# Patient Record
Sex: Female | Born: 1935 | Race: White | Hispanic: No | Marital: Single | Smoking: Never smoker
Health system: Southern US, Community
[De-identification: ages and names within clinical notes are randomized; demographics above are authoritative.]

## PROBLEM LIST (undated history)

## (undated) DIAGNOSIS — Z86711 Personal history of pulmonary embolism: Secondary | ICD-10-CM

## (undated) DIAGNOSIS — E785 Hyperlipidemia, unspecified: Secondary | ICD-10-CM

## (undated) DIAGNOSIS — G5139 Clonic hemifacial spasm, unspecified: Secondary | ICD-10-CM

## (undated) DIAGNOSIS — K219 Gastro-esophageal reflux disease without esophagitis: Secondary | ICD-10-CM

## (undated) DIAGNOSIS — G2581 Restless legs syndrome: Secondary | ICD-10-CM

## (undated) DIAGNOSIS — I10 Essential (primary) hypertension: Secondary | ICD-10-CM

## (undated) DIAGNOSIS — R011 Cardiac murmur, unspecified: Secondary | ICD-10-CM

## (undated) DIAGNOSIS — C50919 Malignant neoplasm of unspecified site of unspecified female breast: Secondary | ICD-10-CM

## (undated) HISTORY — DX: Clonic hemifacial spasm, unspecified: G51.39

## (undated) HISTORY — DX: Restless legs syndrome: G25.81

## (undated) HISTORY — DX: Essential (primary) hypertension: I10

## (undated) HISTORY — DX: Hyperlipidemia, unspecified: E78.5

## (undated) HISTORY — PX: BREAST EXCISIONAL BIOPSY: SUR124

## (undated) HISTORY — PX: ABDOMINAL HYSTERECTOMY: SHX81

---

## 1997-06-02 HISTORY — PX: TOE SURGERY: SHX1073

## 2000-06-02 HISTORY — PX: COLONOSCOPY: SHX174

## 2000-10-13 ENCOUNTER — Ambulatory Visit (HOSPITAL_COMMUNITY): Admission: RE | Admit: 2000-10-13 | Discharge: 2000-10-13 | Payer: Self-pay

## 2001-10-25 ENCOUNTER — Ambulatory Visit (HOSPITAL_COMMUNITY): Admission: RE | Admit: 2001-10-25 | Discharge: 2001-10-25 | Payer: Self-pay | Admitting: *Deleted

## 2001-10-25 ENCOUNTER — Encounter: Payer: Self-pay | Admitting: *Deleted

## 2002-11-09 ENCOUNTER — Encounter: Payer: Self-pay | Admitting: *Deleted

## 2002-11-09 ENCOUNTER — Ambulatory Visit (HOSPITAL_COMMUNITY): Admission: RE | Admit: 2002-11-09 | Discharge: 2002-11-09 | Payer: Self-pay | Admitting: *Deleted

## 2003-12-28 ENCOUNTER — Ambulatory Visit (HOSPITAL_COMMUNITY): Admission: RE | Admit: 2003-12-28 | Discharge: 2003-12-28 | Payer: Self-pay

## 2005-07-07 ENCOUNTER — Ambulatory Visit: Payer: Self-pay | Admitting: Gastroenterology

## 2006-06-16 ENCOUNTER — Ambulatory Visit: Payer: Self-pay | Admitting: Obstetrics and Gynecology

## 2007-04-06 ENCOUNTER — Ambulatory Visit: Payer: Self-pay | Admitting: Internal Medicine

## 2007-05-25 ENCOUNTER — Ambulatory Visit: Payer: Self-pay | Admitting: Gastroenterology

## 2008-04-07 ENCOUNTER — Ambulatory Visit: Payer: Self-pay | Admitting: Internal Medicine

## 2009-04-13 ENCOUNTER — Ambulatory Visit: Payer: Self-pay | Admitting: Internal Medicine

## 2010-05-16 ENCOUNTER — Ambulatory Visit: Payer: Self-pay | Admitting: Internal Medicine

## 2011-05-30 ENCOUNTER — Ambulatory Visit: Payer: Self-pay | Admitting: Internal Medicine

## 2012-02-13 ENCOUNTER — Ambulatory Visit: Payer: Self-pay | Admitting: Neurology

## 2012-06-04 ENCOUNTER — Ambulatory Visit: Payer: Self-pay | Admitting: Internal Medicine

## 2013-01-28 ENCOUNTER — Ambulatory Visit: Payer: Self-pay | Admitting: Internal Medicine

## 2013-03-02 ENCOUNTER — Encounter: Payer: Self-pay | Admitting: General Surgery

## 2013-03-21 ENCOUNTER — Ambulatory Visit (INDEPENDENT_AMBULATORY_CARE_PROVIDER_SITE_OTHER): Payer: Medicare Other | Admitting: General Surgery

## 2013-03-21 ENCOUNTER — Encounter: Payer: Self-pay | Admitting: General Surgery

## 2013-03-21 VITALS — BP 130/76 | HR 74 | Resp 12 | Ht 62.0 in | Wt 155.0 lb

## 2013-03-21 DIAGNOSIS — R109 Unspecified abdominal pain: Secondary | ICD-10-CM

## 2013-03-21 NOTE — Progress Notes (Signed)
Patient ID: Patricia Knox, female   DOB: [birth year], 77 y.o.   MRN: [id number]  Chief Complaint  Patient presents with  . Other    gallbladder    HPI Patricia Knox is a 77 y.o. female who presents for an evaluation of her gallbladder. Referred here by Dr Yves Dill. Had gallbladder U/S done in office which showed gallbladder sludge. The patient states that she went in for a check up with her primary care physician and mentioned left back pressure. She states when she stand for a long period of time that's when she starts to feel the pressure. Sitting down helps to relieve the pain. This is what prompted an x-ray and ultrasound. No other complaints.  The patient reports no dietary intolerance. No change in bowel or bladder function.    HPI  Past Medical History  Diagnosis Date  . Hyperlipidemia     Past Surgical History  Procedure Laterality Date  . Toe surgery  1999    No family history on file.  Social History History  Substance Use Topics  . Smoking status: Never Smoker   . Smokeless tobacco: Never Used  . Alcohol Use: No    No Known Allergies  Current Outpatient Prescriptions  Medication Sig Dispense Refill  . alendronate (FOSAMAX) 70 MG tablet Take 70 mg by mouth every 7 (seven) days.       . Calcium Carbonate-Vitamin D (CALTRATE 600+D PO) Take 1 capsule by mouth 2 (two) times daily.      . fish oil-omega-3 fatty acids 1000 MG capsule Take 1 g by mouth 2 (two) times daily.      [name] lisinopril-hydrochlorothiazide (PRINZIDE,ZESTORETIC) 20-12.5 MG per tablet Take 0.5 tablets by mouth daily.      [name] omeprazole (PRILOSEC) 20 MG capsule       . rOPINIRole (REQUIP) 0.5 MG tablet       . simvastatin (ZOCOR) 80 MG tablet       . traZODone (DESYREL) 100 MG tablet       . warfarin (COUMADIN) 5 MG tablet        No current facility-administered medications for this visit.    Review of Systems Review of Systems  Constitutional: Negative.   Respiratory: Negative.    Cardiovascular: Negative.   Gastrointestinal: Negative.     Blood pressure 130/76, pulse 74, resp. rate 12, height 5\' 2"  (1.575 m), weight 155 lb (70.308 kg).  Physical Exam Physical Exam  Constitutional: She is oriented to person, place, and time. She appears well-developed and well-nourished.  Neck: No thyromegaly present.  Cardiovascular: Normal rate, regular rhythm and normal pulses.   Murmur heard.  Systolic murmur is present with a grade of 2/6  Mild edema in lower extremities.  Pulmonary/Chest: Effort normal and breath sounds normal.  Abdominal: Soft. Normal appearance and bowel sounds are normal. There is no hepatosplenomegaly. There is no tenderness. No hernia.  Lymphadenopathy:    She has no cervical adenopathy.  Neurological: She is alert and oriented to person, place, and time.  Skin: Skin is warm and dry.    Data Reviewed Abdominal ultrasound dated February 16, 2013 completed to her primary care physician's office showed intraluminal sludge/and/or intraluminal gallbladder crystals within the bile. Common bile duct diameter 0.24 cm. Gallbladder wall thickness 0.26 cm. No evidence of biliary tract obstruction.  Assessment    Left flank pain likely secondary to musculoskeletal source. No evidence of symptomatic discomfort related to biliary tract.     Plan  No intervention is recommended at this time. The patient was encouraged to call should she develop fatty food intolerance or right upper quadrant pain. Follow up otherwise will be on an as needed basis.        Earline Mayotte 03/22/2013, 9:16 PM

## 2013-03-21 NOTE — Patient Instructions (Addendum)
Patient to return as needed. If the patient begins to have new symptoms, questions, or concerns she is to contact our office.

## 2013-06-02 HISTORY — PX: BREAST SURGERY: SHX581

## 2013-06-17 ENCOUNTER — Ambulatory Visit: Payer: Self-pay | Admitting: Internal Medicine

## 2013-06-23 ENCOUNTER — Ambulatory Visit: Payer: Self-pay | Admitting: Internal Medicine

## 2013-07-08 ENCOUNTER — Ambulatory Visit: Payer: Self-pay | Admitting: Internal Medicine

## 2013-07-08 HISTORY — PX: BREAST BIOPSY: SHX20

## 2013-07-11 LAB — PATHOLOGY REPORT

## 2013-07-27 ENCOUNTER — Telehealth: Payer: Self-pay

## 2013-07-27 NOTE — Telephone Encounter (Signed)
Message left for patient to call back to reschedule appointment for tomorrow due to the weather.

## 2013-07-28 ENCOUNTER — Encounter: Payer: Self-pay | Admitting: General Surgery

## 2013-07-28 ENCOUNTER — Ambulatory Visit (INDEPENDENT_AMBULATORY_CARE_PROVIDER_SITE_OTHER): Payer: Medicare Other | Admitting: General Surgery

## 2013-07-28 ENCOUNTER — Ambulatory Visit: Payer: Medicare Other | Admitting: General Surgery

## 2013-07-28 VITALS — Ht 62.0 in | Wt 154.0 lb

## 2013-07-28 DIAGNOSIS — N63 Unspecified lump in unspecified breast: Secondary | ICD-10-CM

## 2013-07-28 DIAGNOSIS — R928 Other abnormal and inconclusive findings on diagnostic imaging of breast: Secondary | ICD-10-CM

## 2013-07-28 DIAGNOSIS — D249 Benign neoplasm of unspecified breast: Secondary | ICD-10-CM | POA: Insufficient documentation

## 2013-07-28 NOTE — Progress Notes (Addendum)
Patient ID: Patricia Knox, female   DOB: April 09, 1936, 78 y.o.   MRN: [id number]  Chief Complaint  Patient presents with  . Breast Problem    Biopsy done at [facility].  Surgical consult requested.    HPI BRIDGITTE Knox is a 78 y.o. female.  Is seen regarding a breast abnormality. The patient had screening mammograms in January 2015 and was called back for additional views and ultrasound of an area in the left breast. 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 She is seen now to discuss options for management.  The patient has not had any breast problems in the past. No history of trauma to the breast. HPI  Past Medical History  Diagnosis Date  . Hyperlipidemia     Past Surgical History  Procedure Laterality Date  . Toe surgery  1999    History reviewed. No pertinent family history.  Social History History  Substance Use Topics  . Smoking status: Never Smoker   . Smokeless tobacco: Never Used  . Alcohol Use: No    No Known Allergies  Current Outpatient Prescriptions  Medication Sig Dispense Refill  . alendronate (FOSAMAX) 70 MG tablet Take 70 mg by mouth every 7 (seven) days.       . Calcium Carbonate-Vitamin D (CALTRATE 600+D PO) Take 1 capsule by mouth 2 (two) times daily.      . fish oil-omega-3 fatty acids 1000 MG capsule Take 1 g by mouth 2 (two) times daily.      [name] lisinopril-hydrochlorothiazide (PRINZIDE,ZESTORETIC) 20-12.5 MG per tablet Take 0.5 tablets by mouth daily.      [name] omeprazole (PRILOSEC) 20 MG capsule       . rOPINIRole (REQUIP) 0.5 MG tablet       . simvastatin (ZOCOR) 80 MG tablet       . traZODone (DESYREL) 100 MG tablet       . warfarin (COUMADIN) 5 MG tablet        No current facility-administered medications for this visit.    Review of Systems Review of Systems  Height 5\' 2"  (1.575 m), weight 154 lb (69.854 kg).  Physical Exam Physical  Exam  Constitutional: She appears well-developed and well-nourished.  HENT:  Head: Normocephalic.  Eyes: Pupils are equal, round, and reactive to light.  Neck: Neck supple.  Cardiovascular: Normal rate and regular rhythm.   Pulmonary/Chest: Effort normal and breath sounds normal. Right breast exhibits no inverted nipple, no mass and no nipple discharge. Left breast exhibits no inverted nipple, no mass and no nipple discharge. Breasts are symmetrical.  Lymphadenopathy:    She has no axillary adenopathy.    Data Reviewed Mammograms from January 2012 through February 2050 were reviewed. The area of density best appreciated on the CC view of the left breast in January 2012 measured 0.82 cm. On the January 2015 exam this measured 0.85 cm.  Pathology as noted above 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  Assessment    Papillary neoplasm of the left breast. Low likelihood of malignancy.    Plan    The original biopsy was completed with a 14-gauge device. I have a low index of suspicion for malignancy, and have recommended complete removal of the lesion by vacuum biopsy. This would be less morbid and less disruptive than formal excision.  The procedure was reviewed, and the patient will contact the office on February 23 to schedule. He  follow up exam was not schedule at the time of today's visit to the inclement weather and skeleton staff.     The patient is on chronic Coumadin therapy secondary to a pulmonary embolus with moderate pulmonary hypertension originally diagnosed in November 2008. We'll plan to complete the biopsy on Coumadin to minimize the risk of recurrent pulmonary emboli.  [name] 07/28/2013, 11:59 AM

## 2013-08-01 ENCOUNTER — Telehealth: Payer: Self-pay | Admitting: *Deleted

## 2013-08-01 NOTE — Telephone Encounter (Signed)
Per Dr. Bary Castilla: The patient will be calling the office on Monday, March 2 2 scheduled appointment for a vacuum biopsy of the left breast. Please arrange at the earliest convenient time. Thank you  Patient has been scheduled for 08-18-13 per her request for a Thursday appointment. This patient states she is not taking Mobic but is taking Requip for restless legs syndrome and cannot go without taking this medication.

## 2013-08-01 NOTE — Telephone Encounter (Signed)
Message copied by Dominga Ferry on Mon Aug 01, 2013  1:36 PM ------      Message from: [place], Forest Gleason      Created: Thu Jul 28, 2013 12:07 PM       The patient should continue her Coumadin, but if she is making use of Mobic she would be asked to stop this prior to the procedure. ------

## 2013-08-18 ENCOUNTER — Encounter: Payer: Self-pay | Admitting: General Surgery

## 2013-08-18 ENCOUNTER — Ambulatory Visit (INDEPENDENT_AMBULATORY_CARE_PROVIDER_SITE_OTHER): Payer: Medicare Other | Admitting: General Surgery

## 2013-08-18 ENCOUNTER — Other Ambulatory Visit: Payer: Medicare Other

## 2013-08-18 VITALS — BP 118/80 | HR 76 | Resp 12 | Ht 62.0 in | Wt 155.0 lb

## 2013-08-18 DIAGNOSIS — N63 Unspecified lump in unspecified breast: Secondary | ICD-10-CM

## 2013-08-18 DIAGNOSIS — D249 Benign neoplasm of unspecified breast: Secondary | ICD-10-CM

## 2013-08-18 NOTE — Patient Instructions (Signed)

## 2013-08-18 NOTE — Progress Notes (Signed)
Patient ID: Patricia Knox, female   DOB: [birth year], 78 y.o.   MRN: [id number]  Chief Complaint  Patient presents with  . Procedure    left breast biopsy    HPI ABRIELLE Knox is a 78 y.o. female.  here today for left breast ENCORE breast biopsy. The patient had previously had a core biopsy with a 14-gauge device what turned out to be papillary neoplasm with sclerosis. No evidence of atypia or malignancy noted. She is making use of Coumadin for previous pulmonary embolism with pulmonary compromise. She was felt to be a candidate for vacuum excision of the area as opposed to formal operative excision to minimize the risk for bleeding on Coumadin or recurrent pulmonary embolism off Coumadin. She reports at the time of her January 2015 biopsy her Coumadin was held. HPI  Past Medical History  Diagnosis Date  . Hyperlipidemia     Past Surgical History  Procedure Laterality Date  . Toe surgery  1999  . Breast surgery Left January 2015    Pathology as noted above 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    No family history on file.  Social History History  Substance Use Topics  . Smoking status: Never Smoker   . Smokeless tobacco: Never Used  . Alcohol Use: No    No Known Allergies  Current Outpatient Prescriptions  Medication Sig Dispense Refill  . alendronate (FOSAMAX) 70 MG tablet Take 70 mg by mouth every 7 (seven) days.       . Calcium Carbonate-Vitamin D (CALTRATE 600+D PO) Take 1 capsule by mouth 2 (two) times daily.      . fish oil-omega-3 fatty acids 1000 MG capsule Take 1 g by mouth 2 (two) times daily.      Patricia Knox lisinopril-hydrochlorothiazide (PRINZIDE,ZESTORETIC) 20-12.5 MG per tablet Take 0.5 tablets by mouth daily.      Patricia Knox omeprazole (PRILOSEC) 20 MG capsule       . rOPINIRole (REQUIP) 0.5 MG tablet       . simvastatin (ZOCOR) 80 MG tablet       . traZODone (DESYREL) 100 MG tablet       . warfarin (COUMADIN) 5 MG tablet        No current  facility-administered medications for this visit.    Review of Systems Review of Systems  Constitutional: Negative.   Respiratory: Negative.   Cardiovascular: Negative.     Blood pressure 118/80, pulse 76, resp. rate 12, height 5\' 2"  (1.575 m), weight 155 lb (70.308 kg).  Physical Exam Physical Exam Clinical examination of the breast is unremarkable.  Data Reviewed Pathology as noted above 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   Ultrasound examination of the left breast at the 9:30 o'clock position shows 0.4-0.46 x 0.5 cm hypoechoic area corresponding to her previous study. Skin prepped with alcohol was undertaken followed by 10 cc of 0.5% Xylocaine 0.25% Marcaine with 1-200,000 epinephrine. This was allowed to 12 for 10 minutes to achieve maximum vasoconstriction. Under ultrasound guidance a 9-gauge Encor device was passed under direct vision through lesion. Multiple samples were obtained with complete removal. The bowl length had been set to 11 mm to minimize tissue disturbance. Scant bleeding was noted. Direct pressure was held for 10 minutes. Skin defect was closed with benzoin and Steri-Strips followed by Telfa and Tegaderm dressing. The procedure was well-tolerated.  Assessment    Intraductal papillary neoplasm with sclerosis.        Plan    The patient  will be contacted once pathology is available.  Written instructions are for postbiopsy care were provided.       Patricia Knox 08/20/2013, 8:16 AM

## 2013-08-20 ENCOUNTER — Encounter: Payer: Self-pay | Admitting: General Surgery

## 2013-08-22 ENCOUNTER — Telehealth: Payer: Self-pay | Admitting: General Surgery

## 2013-08-22 LAB — PATHOLOGY

## 2013-08-22 NOTE — Telephone Encounter (Signed)
Telephone report from Delorse Lek, M.D. in the department of pathology showed the tissue resected at the time of the 9-gauge vacuum biopsy on 08/18/2013 showed only intraductal papilloma. No atypia. The lesion was completely removed based on ultrasound exam during the procedure.  The patient was notified that the results were benign.  She reports minimal bruising in no discomfort. She is going to come in on 08/24/2013 for nursing exam.  She should resume annual screening mammograms in January 2016 with her primary care provider.

## 2013-08-24 ENCOUNTER — Ambulatory Visit (INDEPENDENT_AMBULATORY_CARE_PROVIDER_SITE_OTHER): Payer: Medicare Other | Admitting: *Deleted

## 2013-08-24 DIAGNOSIS — N63 Unspecified lump in unspecified breast: Secondary | ICD-10-CM

## 2013-08-24 NOTE — Patient Instructions (Addendum)
Continue self breast exams. Call office for any new breast issues or concerns. She should resume annual screening mammograms in January 2016 with her primary care provider

## 2013-08-24 NOTE — Progress Notes (Signed)
Here for left breast biopsy check. Patient here today for follow up post left breast biopsy.   Minimal bruising noted.  The patient is aware that a heating pad may be used for comfort as needed.  Aware of pathology. Follow up as scheduled.

## 2014-04-03 ENCOUNTER — Encounter: Payer: Self-pay | Admitting: General Surgery

## 2014-05-23 ENCOUNTER — Ambulatory Visit: Payer: Self-pay | Admitting: Internal Medicine

## 2014-10-27 ENCOUNTER — Other Ambulatory Visit: Payer: Self-pay | Admitting: Internal Medicine

## 2014-10-27 DIAGNOSIS — Z1231 Encounter for screening mammogram for malignant neoplasm of breast: Secondary | ICD-10-CM

## 2014-11-10 ENCOUNTER — Ambulatory Visit: Payer: Self-pay

## 2014-11-17 ENCOUNTER — Other Ambulatory Visit: Payer: Self-pay | Admitting: Internal Medicine

## 2014-11-17 ENCOUNTER — Ambulatory Visit
Admission: RE | Admit: 2014-11-17 | Discharge: 2014-11-17 | Disposition: A | Discharge status: Home / Self Care | Payer: Medicare Other | Source: Ambulatory Visit | Attending: Internal Medicine | Admitting: Internal Medicine

## 2014-11-17 DIAGNOSIS — Z1231 Encounter for screening mammogram for malignant neoplasm of breast: Secondary | ICD-10-CM | POA: Diagnosis not present

## 2014-11-30 DIAGNOSIS — R29898 Other symptoms and signs involving the musculoskeletal system: Secondary | ICD-10-CM | POA: Insufficient documentation

## 2014-12-21 ENCOUNTER — Other Ambulatory Visit (HOSPITAL_COMMUNITY)
Admission: RE | Admit: 2014-12-21 | Discharge: 2014-12-21 | Disposition: A | Discharge status: Home / Self Care | Payer: Medicare Other | Source: Ambulatory Visit | Attending: Nephrology | Admitting: Nephrology

## 2015-06-03 DIAGNOSIS — C50919 Malignant neoplasm of unspecified site of unspecified female breast: Secondary | ICD-10-CM

## 2015-06-03 HISTORY — DX: Malignant neoplasm of unspecified site of unspecified female breast: C50.919

## 2015-10-05 ENCOUNTER — Other Ambulatory Visit: Payer: Self-pay | Admitting: Internal Medicine

## 2015-10-05 DIAGNOSIS — Z1231 Encounter for screening mammogram for malignant neoplasm of breast: Secondary | ICD-10-CM

## 2015-11-23 ENCOUNTER — Ambulatory Visit: Payer: Medicare Other | Attending: Internal Medicine

## 2016-01-04 ENCOUNTER — Other Ambulatory Visit: Payer: Self-pay | Admitting: Internal Medicine

## 2016-01-04 ENCOUNTER — Ambulatory Visit
Admission: RE | Admit: 2016-01-04 | Discharge: 2016-01-04 | Disposition: A | Discharge status: Home / Self Care | Payer: Medicare Other | Source: Ambulatory Visit | Attending: Internal Medicine | Admitting: Internal Medicine

## 2016-01-04 DIAGNOSIS — R928 Other abnormal and inconclusive findings on diagnostic imaging of breast: Secondary | ICD-10-CM | POA: Insufficient documentation

## 2016-01-04 DIAGNOSIS — Z1231 Encounter for screening mammogram for malignant neoplasm of breast: Secondary | ICD-10-CM | POA: Diagnosis present

## 2016-01-08 ENCOUNTER — Other Ambulatory Visit: Payer: Self-pay | Admitting: Internal Medicine

## 2016-01-08 DIAGNOSIS — N6320 Unspecified lump in the left breast, unspecified quadrant: Secondary | ICD-10-CM

## 2016-01-18 ENCOUNTER — Ambulatory Visit
Admission: RE | Admit: 2016-01-18 | Discharge: 2016-01-18 | Disposition: A | Discharge status: Home / Self Care | Payer: Medicare Other | Source: Ambulatory Visit | Attending: Internal Medicine | Admitting: Internal Medicine

## 2016-01-18 DIAGNOSIS — N6320 Unspecified lump in the left breast, unspecified quadrant: Secondary | ICD-10-CM

## 2016-01-18 DIAGNOSIS — N63 Unspecified lump in breast: Secondary | ICD-10-CM | POA: Diagnosis present

## 2016-01-18 DIAGNOSIS — N649 Disorder of breast, unspecified: Secondary | ICD-10-CM | POA: Insufficient documentation

## 2016-01-21 ENCOUNTER — Other Ambulatory Visit: Payer: Self-pay | Admitting: Internal Medicine

## 2016-01-21 DIAGNOSIS — R928 Other abnormal and inconclusive findings on diagnostic imaging of breast: Secondary | ICD-10-CM

## 2016-01-30 ENCOUNTER — Other Ambulatory Visit: Payer: Self-pay | Admitting: Internal Medicine

## 2016-01-30 ENCOUNTER — Ambulatory Visit
Admission: RE | Admit: 2016-01-30 | Discharge: 2016-01-30 | Disposition: A | Discharge status: Home / Self Care | Payer: Medicare Other | Source: Ambulatory Visit | Attending: Internal Medicine | Admitting: Internal Medicine

## 2016-01-30 DIAGNOSIS — N6022 Fibroadenosis of left breast: Secondary | ICD-10-CM | POA: Insufficient documentation

## 2016-01-30 DIAGNOSIS — R928 Other abnormal and inconclusive findings on diagnostic imaging of breast: Secondary | ICD-10-CM

## 2016-01-30 DIAGNOSIS — N62 Hypertrophy of breast: Secondary | ICD-10-CM | POA: Insufficient documentation

## 2016-01-30 DIAGNOSIS — Z9889 Other specified postprocedural states: Secondary | ICD-10-CM | POA: Diagnosis not present

## 2016-01-30 DIAGNOSIS — D242 Benign neoplasm of left breast: Secondary | ICD-10-CM | POA: Diagnosis not present

## 2016-01-30 DIAGNOSIS — N641 Fat necrosis of breast: Secondary | ICD-10-CM | POA: Insufficient documentation

## 2016-01-30 HISTORY — PX: BREAST BIOPSY: SHX20

## 2016-01-31 LAB — SURGICAL PATHOLOGY

## 2016-02-07 ENCOUNTER — Encounter: Payer: Self-pay | Admitting: *Deleted

## 2016-02-13 ENCOUNTER — Other Ambulatory Visit: Payer: Self-pay

## 2016-02-13 ENCOUNTER — Encounter: Payer: Self-pay | Admitting: General Surgery

## 2016-02-13 ENCOUNTER — Ambulatory Visit (INDEPENDENT_AMBULATORY_CARE_PROVIDER_SITE_OTHER): Payer: Medicare Other | Admitting: General Surgery

## 2016-02-13 VITALS — BP 124/70 | HR 74 | Resp 14 | Ht 60.0 in | Wt 150.0 lb

## 2016-02-13 DIAGNOSIS — D242 Benign neoplasm of left breast: Secondary | ICD-10-CM

## 2016-02-13 DIAGNOSIS — N63 Unspecified lump in breast: Secondary | ICD-10-CM | POA: Diagnosis not present

## 2016-02-13 DIAGNOSIS — N6320 Unspecified lump in the left breast, unspecified quadrant: Secondary | ICD-10-CM

## 2016-02-13 NOTE — Progress Notes (Signed)
Patient ID: Patricia Knox, female   DOB: 1935/06/19, 80 y.o.   MRN: [id number]  Chief Complaint  Patient presents with  . Follow-up    HPI Patricia Knox is a 80 y.o. female who presents for a breast evaluation. The most recent mammogram was done on 01/04/16 added views on 01/18/16 and left breast biopsy done on 01/30/16. [name]  Patient does perform regular self breast checks and gets regular mammograms done.    The patient was seen here in 2015 when she had undergone a 14-gauge biopsy of a breast mass which showed an intraductal papillary neoplasm. At that time we elected to complete a Encor biopsy to remove the area without discontinuing her oral anticoagulant therapy for a relatively recent pulmonary emboli. Biopsy at that time showed an intraductal papilloma with focal apical change without atypia or malignancy. This lesion was in the 9:30 o'clock position of the left breast.  The patient is no longer on oral anticoagulation.    HPI  Past Medical History:  Diagnosis Date  . Hyperlipidemia   . Hypertension     Past Surgical History:  Procedure Laterality Date  . BREAST BIOPSY Left 2015   NEG  . BREAST BIOPSY Left 01/30/2016   path pending  . 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  . COLONOSCOPY  2002  . TOE SURGERY  1999    Family History  Problem Relation Age of Onset  . Breast cancer Neg Hx     Social History Social History  Substance Use Topics  . Smoking status: Never Smoker  . Smokeless tobacco: Never Used  . Alcohol use No    No Known Allergies  Current Outpatient Prescriptions  Medication Sig Dispense Refill  . alendronate (FOSAMAX) 70 MG tablet Take 70 mg by mouth every 7 (seven) days.     [name] aspirin 325 MG tablet Take 325 mg by mouth daily.    . Calcium Carbonate-Vitamin D (CALTRATE 600+D PO) Take 1 capsule by mouth 2 (two) times daily.    . fish oil-omega-3 fatty acids 1000 MG  capsule Take 1 g by mouth 2 (two) times daily.    [name] lisinopril-hydrochlorothiazide (PRINZIDE,ZESTORETIC) 20-12.5 MG per tablet Take 0.5 tablets by mouth daily.    [name] omeprazole (PRILOSEC) 20 MG capsule     . rOPINIRole (REQUIP) 0.5 MG tablet     . simvastatin (ZOCOR) 80 MG tablet     . traZODone (DESYREL) 100 MG tablet      No current facility-administered medications for this visit.     Review of Systems Review of Systems  Constitutional: Negative.   Respiratory: Negative.   Cardiovascular: Negative.     Blood pressure 124/70, pulse 74, resp. rate 14, height 5' (1.524 m), weight 150 lb (68 kg).  Physical Exam Physical Exam  Constitutional: She is oriented to person, place, and time. She appears well-developed and well-nourished.  Eyes: Conjunctivae are normal. No scleral icterus.  Neck: Neck supple.  Cardiovascular: Normal rate and regular rhythm.   Murmur heard.  Systolic murmur is present with a grade of 3/6  Pulmonary/Chest: Effort normal and breath sounds normal. Right breast exhibits no inverted nipple, no mass, no nipple discharge, no skin change and no tenderness. Left breast exhibits no inverted nipple, no nipple discharge, no skin change and no tenderness.  Abdominal: Soft. Bowel sounds are normal. There is no tenderness.  Lymphadenopathy:    She has no cervical  adenopathy.  Neurological: She is alert and oriented to person, place, and time.  Skin: Skin is warm and dry.    Data Reviewed  August 2017 mammograms and ultrasounds reviewed. New density in the upper inner quadrant of the left breast about 15 mm from the 2015 biopsy clip. The latest biopsy site is slightly anterior and medial to the 2015 clip. The recently placed marking clip is a "top hat". The 2015 marker is a ribbon.  Biopsy results:  A. BREAST, LEFT, UPPER INNER QUADRANT; STEREOTACTIC CORE BIOPSY:  - INTRADUCTAL PAPILLOMA.  - FOCAL ATYPICAL LOBULAR HYPERPLASIA.  - POST-SURGICAL SCARRING AND FAT  NECROSIS.   Ultrasound examination of the left breast in the 10:00 position 5 cm from the nipple showed the recently placed marking clip and a 0.93 x 0.93 x 1.0 cm biopsy cavity.      Assessment    Atypical lobular hyperplasia identified on biopsy warrants excision to assess for possible upstaging to LCIS/invasive cancer.    Plan    Indications for formal excision were reviewed.    Patient to be scheduled at [facility] for left breast biopsy .   The patient is scheduled for surgery at [facility] on 03/14/16. She will pre admit by phone. The patient is aware of date and instructions.   As the patient will not be having surgery for a month, brief visit prior to the procedure will be necessary to confirm that the location is identifiable on ultrasound.  This information has been scribed by Gaspar Cola CMA.  [name] 02/15/2016, 10:01 AM

## 2016-02-13 NOTE — Patient Instructions (Addendum)
Patient to be scheduled at [facility] for left breast biopsy .   The patient is scheduled for surgery at [facility] on 03/14/16. She will pre admit by phone. The patient is aware of date and instructions.

## 2016-02-15 ENCOUNTER — Telehealth: Payer: Self-pay

## 2016-02-15 NOTE — Telephone Encounter (Signed)
-----   Message from [name], MD sent at 02/15/2016 10:16 AM EDT ----- As the patient's biopsy is scheduled a month out, she will need a brief visit prior to the procedure to confirm the area still visible on ultrasound.

## 2016-03-05 ENCOUNTER — Encounter: Payer: Self-pay | Admitting: General Surgery

## 2016-03-05 ENCOUNTER — Inpatient Hospital Stay: Payer: Self-pay

## 2016-03-05 ENCOUNTER — Ambulatory Visit (INDEPENDENT_AMBULATORY_CARE_PROVIDER_SITE_OTHER): Payer: Medicare Other | Admitting: General Surgery

## 2016-03-05 VITALS — BP 124/68 | HR 72 | Resp 12 | Ht 60.0 in | Wt 151.0 lb

## 2016-03-05 DIAGNOSIS — N6320 Unspecified lump in the left breast, unspecified quadrant: Secondary | ICD-10-CM

## 2016-03-05 NOTE — Progress Notes (Signed)
Patient ID: Patricia Knox, female   DOB: 1936/01/22, 80 y.o.   MRN: [id number]  Chief Complaint  Patient presents with  . Pre-op Exam    HPI Patricia Knox is a 80 y.o. female here today for her pre op left breast surgery scheduled for 03/14/16.  HPI  Past Medical History:  Diagnosis Date  . Hyperlipidemia   . Hypertension     Past Surgical History:  Procedure Laterality Date  . BREAST BIOPSY Left 2015   NEG  . BREAST BIOPSY Left 01/30/2016   path pending  . 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  . COLONOSCOPY  2002  . TOE SURGERY  1999    Family History  Problem Relation Age of Onset  . Breast cancer Neg Hx     Social History Social History  Substance Use Topics  . Smoking status: Never Smoker  . Smokeless tobacco: Never Used  . Alcohol use No    No Known Allergies  Current Outpatient Prescriptions  Medication Sig Dispense Refill  . alendronate (FOSAMAX) 70 MG tablet Take 70 mg by mouth every 7 (seven) days.     [name] aspirin 325 MG tablet Take 325 mg by mouth daily.    . Calcium Carbonate-Vitamin D (CALTRATE 600+D PO) Take 1 capsule by mouth 2 (two) times daily.    . fish oil-omega-3 fatty acids 1000 MG capsule Take 1 g by mouth 2 (two) times daily.    [name] lisinopril-hydrochlorothiazide (PRINZIDE,ZESTORETIC) 20-12.5 MG per tablet Take 0.5 tablets by mouth daily.    [name] omeprazole (PRILOSEC) 20 MG capsule     . rOPINIRole (REQUIP) 0.5 MG tablet     . simvastatin (ZOCOR) 80 MG tablet     . traZODone (DESYREL) 100 MG tablet      No current facility-administered medications for this visit.     Review of Systems Review of Systems  Constitutional: Negative.   Respiratory: Negative.   Cardiovascular: Negative.     Blood pressure 124/68, pulse 72, resp. rate 12, height 5' (1.524 m), weight 151 lb (68.5 kg).  Physical Exam Physical Exam  Constitutional: She is oriented to person, place,  and time. She appears well-developed and well-nourished.  Eyes: Conjunctivae are normal. No scleral icterus.  Neck: Neck supple.  Cardiovascular: Normal rate and regular rhythm.   Murmur heard.  Systolic murmur is present with a grade of 2/6  Pulmonary/Chest: Effort normal and breath sounds normal.    Lymphadenopathy:    She has no cervical adenopathy.  Neurological: She is alert and oriented to person, place, and time.  Skin: Skin is warm and dry.    Data Reviewed Ultrasound examination of the left breast was completed to confirm that the original biopsy status still visible. And the left breast at the 10:00 position, 5 cm from the nipple a 0.71 x 0.72 x 0.73 hypoechoic mass with dense posterior acoustic shadowing is noted. Borders are indistinct. BI-RADS-4.    Assessment    Left breast papilloma on previous biopsy with interval increase in size.    Plan         Patient is scheduled left breast lumpectomy on 03/14/16. This information has been scribed by Gaspar Cola CMA.   Patricia Knox 03/05/2016, 8:52 PM

## 2016-03-05 NOTE — Patient Instructions (Signed)
Patient is scheduled for a left breast lumpectomy on 03/14/16.

## 2016-03-07 ENCOUNTER — Inpatient Hospital Stay: Admission: RE | Admit: 2016-03-07 | Payer: Medicare Other | Source: Ambulatory Visit

## 2016-03-11 ENCOUNTER — Encounter: Payer: Self-pay | Admitting: *Deleted

## 2016-03-11 NOTE — Patient Instructions (Signed)
  Your procedure is scheduled on: 03-14-16 (FRIDAY) Report to Same Day Surgery 2nd floor medical mall To find out your arrival time please call [phone] between [place] on 03-13-16 (THURSDAY)  Remember: Instructions that are not followed completely may result in serious medical risk, up to and including death, or upon the discretion of your surgeon and anesthesiologist your surgery may need to be rescheduled.    _x___ 1. Do not eat food or drink liquids after midnight. No gum chewing or hard candies.     __x__ 2. No Alcohol for 24 hours before or after surgery.   __x__3. No Smoking for 24 prior to surgery.   ____  4. Bring all medications with you on the day of surgery if instructed.    __x__ 5. Notify your doctor if there is any change in your medical condition     (cold, fever, infections).     Do not wear jewelry, make-up, hairpins, clips or nail polish.  Do not wear lotions, powders, or perfumes. You may wear deodorant.  Do not shave 48 hours prior to surgery. Men may shave face and neck.  Do not bring valuables to the hospital.    [facility] is not responsible for any belongings or valuables.               Contacts, dentures or bridgework may not be worn into surgery.  Leave your suitcase in the car. After surgery it may be brought to your room.  For patients admitted to the hospital, discharge time is determined by your treatment team.   Patients discharged the day of surgery will not be allowed to drive home.    Please read over the following fact sheets that you were given:   [facility] Preparing for Surgery and or MRSA Information   _x___ Take these medicines the morning of surgery with A SIP OF WATER:    1. REQUIP (ROPINOROLE)  2. PRILOSEC (OMEPRAZOLE)  3. TAKE A PRILOSEC AT BEDTIME THE NIGHT BEFORE SURGERY  4.  5.  6.  ____Fleets enema or Magnesium Citrate as directed.   _x___ Use CHG Soap or sage wipes as directed on instruction sheet   ____ Use  inhalers on the day of surgery and bring to hospital day of surgery  ____ Stop metformin 2 days prior to surgery    ____ Take 1/2 of usual insulin dose the night before surgery and none on the morning of surgery.   ____ Stop aspirin or coumadin, or plavix-OK TO CONTINUE 325 MG ASPIRIN PER DR BYRNETT  __ Stop Anti-inflammatories such as Advil, Aleve, Ibuprofen, Motrin, Naproxen,          Naprosyn, Goodies powders or aspirin products. Ok to take Tylenol.   ____ Stop supplements until after surgery.    ____ Bring C-Pap to the hospital.

## 2016-03-12 ENCOUNTER — Encounter
Admission: RE | Admit: 2016-03-12 | Discharge: 2016-03-12 | Disposition: A | Discharge status: Home / Self Care | Payer: Medicare Other | Source: Ambulatory Visit | Attending: General Surgery | Admitting: General Surgery

## 2016-03-12 DIAGNOSIS — Z79899 Other long term (current) drug therapy: Secondary | ICD-10-CM | POA: Diagnosis not present

## 2016-03-12 DIAGNOSIS — Z7982 Long term (current) use of aspirin: Secondary | ICD-10-CM | POA: Diagnosis not present

## 2016-03-12 DIAGNOSIS — E785 Hyperlipidemia, unspecified: Secondary | ICD-10-CM | POA: Diagnosis not present

## 2016-03-12 DIAGNOSIS — I69392 Facial weakness following cerebral infarction: Secondary | ICD-10-CM | POA: Diagnosis not present

## 2016-03-12 DIAGNOSIS — K219 Gastro-esophageal reflux disease without esophagitis: Secondary | ICD-10-CM | POA: Diagnosis not present

## 2016-03-12 DIAGNOSIS — Z86711 Personal history of pulmonary embolism: Secondary | ICD-10-CM | POA: Diagnosis not present

## 2016-03-12 DIAGNOSIS — I10 Essential (primary) hypertension: Secondary | ICD-10-CM | POA: Diagnosis not present

## 2016-03-12 DIAGNOSIS — N6092 Unspecified benign mammary dysplasia of left breast: Secondary | ICD-10-CM | POA: Diagnosis present

## 2016-03-12 LAB — BASIC METABOLIC PANEL
ANION GAP: 7 (ref 5–15)
BUN: 20 mg/dL (ref 6–20)
CO2: 27 mmol/L (ref 22–32)
Calcium: 9.2 mg/dL (ref 8.9–10.3)
Chloride: 104 mmol/L (ref 101–111)
Creatinine, Ser: 1.17 mg/dL — ABNORMAL HIGH (ref 0.44–1.00)
GFR calc non Af Amer: 44 mL/min — ABNORMAL LOW (ref >60)
GFR, EST AFRICAN AMERICAN: 51 mL/min — AB (ref >60)
GLUCOSE: 101 mg/dL — AB (ref 65–99)
POTASSIUM: 3.6 mmol/L (ref 3.5–5.1)
Sodium: 138 mmol/L (ref 135–145)

## 2016-03-12 LAB — CBC
HEMATOCRIT: 34.4 % — AB (ref 35.0–47.0)
HEMOGLOBIN: 11.9 g/dL — AB (ref 12.0–16.0)
MCH: 31 pg (ref 26.0–34.0)
MCHC: 34.7 g/dL (ref 32.0–36.0)
MCV: 89.6 fL (ref 80.0–100.0)
Platelets: 255 10*3/uL (ref 150–440)
RBC: 3.84 MIL/uL (ref 3.80–5.20)
RDW: 13.5 % (ref 11.5–14.5)
WBC: 6.2 10*3/uL (ref 3.6–11.0)

## 2016-03-13 NOTE — Pre-Procedure Instructions (Signed)
CALLED CARYL-LYN REGARDING 325 MG ASPIRIN-DR BYRNETT USUALLY MAKES PTS REDUCE ASA TO 81 MG 7 DAYS PRIOR-PT STATES NO ONE TOLD HER TO DO THAT-CARYL-LYN WENT AND ASKED DR Bary Castilla AND HE SAID OK TO CONTINUE ASA 325 MG-  I TOLD PT THIS AND INSTRUCTED HER NOT TO TAKE AM OF SURGERY

## 2016-03-14 ENCOUNTER — Encounter
Admission: RE | Disposition: A | Discharge status: Home / Self Care | Payer: Self-pay | Source: Ambulatory Visit | Attending: General Surgery

## 2016-03-14 ENCOUNTER — Ambulatory Visit: Payer: Medicare Other | Admitting: Anesthesiology

## 2016-03-14 ENCOUNTER — Ambulatory Visit
Admission: RE | Admit: 2016-03-14 | Discharge: 2016-03-14 | Disposition: A | Discharge status: Home / Self Care | Payer: Medicare Other | Source: Ambulatory Visit | Attending: General Surgery | Admitting: General Surgery

## 2016-03-14 ENCOUNTER — Encounter: Payer: Self-pay | Admitting: *Deleted

## 2016-03-14 ENCOUNTER — Ambulatory Visit: Payer: Medicare Other

## 2016-03-14 DIAGNOSIS — R928 Other abnormal and inconclusive findings on diagnostic imaging of breast: Secondary | ICD-10-CM

## 2016-03-14 DIAGNOSIS — N6092 Unspecified benign mammary dysplasia of left breast: Secondary | ICD-10-CM | POA: Insufficient documentation

## 2016-03-14 DIAGNOSIS — D493 Neoplasm of unspecified behavior of breast: Secondary | ICD-10-CM | POA: Diagnosis not present

## 2016-03-14 DIAGNOSIS — I69392 Facial weakness following cerebral infarction: Secondary | ICD-10-CM | POA: Insufficient documentation

## 2016-03-14 DIAGNOSIS — Z79899 Other long term (current) drug therapy: Secondary | ICD-10-CM | POA: Insufficient documentation

## 2016-03-14 DIAGNOSIS — I10 Essential (primary) hypertension: Secondary | ICD-10-CM | POA: Insufficient documentation

## 2016-03-14 DIAGNOSIS — K219 Gastro-esophageal reflux disease without esophagitis: Secondary | ICD-10-CM | POA: Insufficient documentation

## 2016-03-14 DIAGNOSIS — E785 Hyperlipidemia, unspecified: Secondary | ICD-10-CM | POA: Insufficient documentation

## 2016-03-14 DIAGNOSIS — Z86711 Personal history of pulmonary embolism: Secondary | ICD-10-CM | POA: Insufficient documentation

## 2016-03-14 DIAGNOSIS — Z7982 Long term (current) use of aspirin: Secondary | ICD-10-CM | POA: Insufficient documentation

## 2016-03-14 HISTORY — DX: Personal history of pulmonary embolism: Z86.711

## 2016-03-14 HISTORY — PX: BREAST LUMPECTOMY: SHX2

## 2016-03-14 HISTORY — DX: Gastro-esophageal reflux disease without esophagitis: K21.9

## 2016-03-14 HISTORY — DX: Cardiac murmur, unspecified: R01.1

## 2016-03-14 SURGERY — BREAST LUMPECTOMY
Anesthesia: General | Laterality: Left | Wound class: Clean

## 2016-03-14 MED ORDER — HYDROCODONE-ACETAMINOPHEN 5-325 MG PO TABS: 1.0000 | ORAL_TABLET | ORAL | 0 refills | Status: DC | PRN

## 2016-03-14 MED ORDER — ACETAMINOPHEN 10 MG/ML IV SOLN
INTRAVENOUS | Status: DC | PRN
  Administered 2016-03-14: 1000 mg via INTRAVENOUS

## 2016-03-14 MED ORDER — GLYCOPYRROLATE 0.2 MG/ML IJ SOLN
INTRAMUSCULAR | Status: DC | PRN
  Administered 2016-03-14: 0.2 mg via INTRAVENOUS

## 2016-03-14 MED ORDER — OXYCODONE HCL 5 MG PO TABS: 5.0000 mg | ORAL_TABLET | Freq: Once | ORAL | Status: DC | PRN

## 2016-03-14 MED ORDER — PROPOFOL 10 MG/ML IV BOLUS
INTRAVENOUS | Status: DC | PRN
  Administered 2016-03-14: 140 mg via INTRAVENOUS

## 2016-03-14 MED ORDER — ONDANSETRON HCL 4 MG/2ML IJ SOLN
INTRAMUSCULAR | Status: DC | PRN
  Administered 2016-03-14: 4 mg via INTRAVENOUS

## 2016-03-14 MED ORDER — LIDOCAINE HCL (CARDIAC) 20 MG/ML IV SOLN
INTRAVENOUS | Status: DC | PRN
  Administered 2016-03-14: 100 mg via INTRAVENOUS

## 2016-03-14 MED ORDER — LACTATED RINGERS IV SOLN
INTRAVENOUS | Status: DC
  Administered 2016-03-14: 07:00:00 via INTRAVENOUS

## 2016-03-14 MED ORDER — ACETAMINOPHEN 10 MG/ML IV SOLN
INTRAVENOUS | Status: AC
  Filled 2016-03-14: qty 100

## 2016-03-14 MED ORDER — BUPIVACAINE-EPINEPHRINE (PF) 0.5% -1:200000 IJ SOLN
INTRAMUSCULAR | Status: AC
  Filled 2016-03-14: qty 30

## 2016-03-14 MED ORDER — OXYCODONE HCL 5 MG/5ML PO SOLN: 5.0000 mg | Freq: Once | ORAL | Status: DC | PRN

## 2016-03-14 MED ORDER — FENTANYL CITRATE (PF) 100 MCG/2ML IJ SOLN: 25.0000 ug | INTRAMUSCULAR | Status: DC | PRN

## 2016-03-14 MED ORDER — DEXAMETHASONE SODIUM PHOSPHATE 10 MG/ML IJ SOLN
INTRAMUSCULAR | Status: DC | PRN
  Administered 2016-03-14: 10 mg via INTRAVENOUS

## 2016-03-14 MED ORDER — EPHEDRINE SULFATE 50 MG/ML IJ SOLN
INTRAMUSCULAR | Status: DC | PRN
  Administered 2016-03-14: 5 mg via INTRAVENOUS
  Administered 2016-03-14: 10 mg via INTRAVENOUS
  Administered 2016-03-14 (×2): 5 mg via INTRAVENOUS

## 2016-03-14 MED ORDER — FENTANYL CITRATE (PF) 100 MCG/2ML IJ SOLN
INTRAMUSCULAR | Status: DC | PRN
  Administered 2016-03-14 (×2): 25 ug via INTRAVENOUS

## 2016-03-14 SURGICAL SUPPLY — 47 items
BANDAGE ELASTIC 6 LF NS (GAUZE/BANDAGES/DRESSINGS) ×2 IMPLANT
BLADE SURG 15 STRL SS SAFETY (BLADE) ×4 IMPLANT
BNDG GAUZE 4.5X4.1 6PLY STRL (MISCELLANEOUS) ×2 IMPLANT
BULB RESERV EVAC DRAIN JP 100C (MISCELLANEOUS) IMPLANT
CANISTER SUCT 1200ML W/VALVE (MISCELLANEOUS) ×2 IMPLANT
CHLORAPREP W/TINT 26ML (MISCELLANEOUS) ×2 IMPLANT
CNTNR SPEC 2.5X3XGRAD LEK (MISCELLANEOUS) ×1
CONT SPEC 4OZ STER OR WHT (MISCELLANEOUS) ×1
CONTAINER SPEC 2.5X3XGRAD LEK (MISCELLANEOUS) ×1 IMPLANT
COVER PROBE FLX POLY STRL (MISCELLANEOUS) ×2 IMPLANT
DRAIN CHANNEL JP 15F RND 16 (MISCELLANEOUS) IMPLANT
DRAPE LAPAROTOMY TRNSV 106X77 (MISCELLANEOUS) ×2 IMPLANT
DRSG TELFA 3X8 NADH (GAUZE/BANDAGES/DRESSINGS) ×2 IMPLANT
ELECT CAUTERY BLADE TIP 2.5 (TIP) ×2
ELECT REM PT RETURN 9FT ADLT (ELECTROSURGICAL) ×2
ELECTRODE CAUTERY BLDE TIP 2.5 (TIP) ×1 IMPLANT
ELECTRODE REM PT RTRN 9FT ADLT (ELECTROSURGICAL) ×1 IMPLANT
GAUZE FLUFF 18X24 1PLY STRL (GAUZE/BANDAGES/DRESSINGS) ×2 IMPLANT
GAUZE SPONGE 4X4 12PLY STRL (GAUZE/BANDAGES/DRESSINGS) ×2 IMPLANT
GLOVE BIO SURGEON STRL SZ7.5 (GLOVE) ×2 IMPLANT
GLOVE INDICATOR 8.0 STRL GRN (GLOVE) ×2 IMPLANT
GOWN STRL REUS W/ TWL LRG LVL3 (GOWN DISPOSABLE) ×2 IMPLANT
GOWN STRL REUS W/TWL LRG LVL3 (GOWN DISPOSABLE) ×2
HARMONIC SCALPEL FOCUS (MISCELLANEOUS) ×2 IMPLANT
KIT RM TURNOVER STRD PROC AR (KITS) ×2 IMPLANT
LABEL OR SOLS (LABEL) ×2 IMPLANT
MARGIN MAP 10MM (MISCELLANEOUS) ×2 IMPLANT
NDL SAFETY 22GX1.5 (NEEDLE) ×2 IMPLANT
NEEDLE HYPO 25X1 1.5 SAFETY (NEEDLE) ×4 IMPLANT
PACK BASIN MINOR ARMC (MISCELLANEOUS) ×2 IMPLANT
SHEARS FOC LG CVD HARMONIC 17C (MISCELLANEOUS) IMPLANT
STRIP CLOSURE SKIN 1/2X4 (GAUZE/BANDAGES/DRESSINGS) ×2 IMPLANT
SURGI-BRA M (MISCELLANEOUS) ×2 IMPLANT
SUT ETHILON 3-0 FS-10 30 BLK (SUTURE) ×2
SUT SILK 2 0 (SUTURE) ×1
SUT SILK 2-0 18XBRD TIE 12 (SUTURE) ×1 IMPLANT
SUT VIC AB 2-0 CT1 27 (SUTURE) ×2
SUT VIC AB 2-0 CT1 TAPERPNT 27 (SUTURE) ×2 IMPLANT
SUT VIC AB 4-0 FS2 27 (SUTURE) ×2 IMPLANT
SUT VICRYL+ 3-0 144IN (SUTURE) ×2 IMPLANT
SUTURE EHLN 3-0 FS-10 30 BLK (SUTURE) ×1 IMPLANT
SWABSTK COMLB BENZOIN TINCTURE (MISCELLANEOUS) ×2 IMPLANT
SYR BULB IRRIG 60ML STRL (SYRINGE) ×2 IMPLANT
SYR CONTROL 10ML (SYRINGE) ×2 IMPLANT
SYRINGE 10CC LL (SYRINGE) ×2 IMPLANT
TAPE TRANSPORE STRL 2 31045 (GAUZE/BANDAGES/DRESSINGS) ×2 IMPLANT
WATER STERILE IRR 1000ML POUR (IV SOLUTION) ×2 IMPLANT

## 2016-03-14 NOTE — Progress Notes (Signed)
Awake. Refuses po fluids.

## 2016-03-14 NOTE — OR Nursing (Signed)
Patient has slight drop of the left side of mouth. Patient reports she had some nerve issue in her left cheek that caused this about two years ago.

## 2016-03-14 NOTE — Anesthesia Preprocedure Evaluation (Signed)
Anesthesia Evaluation  Patient identified by MRN, date of birth, ID band Patient awake    Reviewed: Allergy & Precautions, H&P , NPO status , Patient's Chart, lab work & pertinent test results  Airway Mallampati: III  TM Distance: >3 FB Neck ROM: full    Dental  (+) Poor Dentition, Missing, Upper Dentures, Lower Dentures   Pulmonary neg pulmonary ROS, neg shortness of breath,    Pulmonary exam normal breath sounds clear to auscultation       Cardiovascular Exercise Tolerance: Good hypertension, Normal cardiovascular exam+ Valvular Problems/Murmurs  Rhythm:regular Rate:Normal     Neuro/Psych CVA (left face weakness), Residual Symptoms negative psych ROS   GI/Hepatic negative GI ROS, Neg liver ROS, GERD  Controlled,  Endo/Other  negative endocrine ROS  Renal/GU      Musculoskeletal   Abdominal   Peds  Hematology negative hematology ROS (+)   Anesthesia Other Findings Past Medical History: No date: GERD (gastroesophageal reflux disease) No date: Heart murmur No date: History of pulmonary embolus (PE) No date: Hyperlipidemia No date: Hypertension  Past Surgical History: 2015: BREAST BIOPSY Left     Comment: NEG 01/30/2016: BREAST BIOPSY Left     Comment: path pending January 2015: BREAST SURGERY Left     Comment: Pathology as noted above 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 2002: COLONOSCOPY 1999: TOE SURGERY     Reproductive/Obstetrics negative OB ROS                             Anesthesia Physical Anesthesia Plan  ASA: III  Anesthesia Plan: General LMA   Post-op Pain Management:    Induction:   Airway Management Planned:   Additional Equipment:   Intra-op Plan:   Post-operative Plan:   Informed Consent: I have reviewed the patients History and Physical, chart, labs and discussed the procedure including the risks, benefits and  alternatives for the proposed anesthesia with the patient or authorized representative who has indicated his/her understanding and acceptance.   Dental Advisory Given  Plan Discussed with: Anesthesiologist, CRNA and Surgeon  Anesthesia Plan Comments:         Anesthesia Quick Evaluation

## 2016-03-14 NOTE — Anesthesia Postprocedure Evaluation (Signed)
Anesthesia Post Note  Patient: Patricia Knox  Procedure(s) Performed: Procedure(s) (LRB): BREAST LUMPECTOMY (Left)  Patient location during evaluation: PACU Anesthesia Type: General Level of consciousness: awake and alert Pain management: pain level controlled Vital Signs Assessment: post-procedure vital signs reviewed and stable Respiratory status: spontaneous breathing, nonlabored ventilation, respiratory function stable and patient connected to nasal cannula oxygen Cardiovascular status: blood pressure returned to baseline and stable Postop Assessment: no signs of nausea or vomiting Anesthetic complications: no    Last Vitals:  Vitals:   03/14/16 0909 03/14/16 0938  BP: 120/73 128/74  Pulse: 91 90  Resp: 16 16  Temp:      Last Pain:  Vitals:   03/14/16 0938  TempSrc:   PainSc: 0-No pain                 Precious Haws Kattleya Kuhnert

## 2016-03-14 NOTE — Transfer of Care (Signed)
Immediate Anesthesia Transfer of Care Note  Patient: Patricia Knox  Procedure(s) Performed: Procedure(s): BREAST LUMPECTOMY (Left)  Patient Location: PACU  Anesthesia Type:General  Level of Consciousness: sedated  Airway & Oxygen Therapy: Patient connected to face mask oxygen  Post-op Assessment: Post -op Vital signs reviewed and stable  Post vital signs: stable  Last Vitals:  Vitals:   03/14/16 0606 03/14/16 0813  BP: 129/74 122/65  Pulse: 63   Resp: 16 19  Temp: 36.4 C 36.9 C    Last Pain:  Vitals:   03/14/16 0813  TempSrc: Temporal  PainSc:          Complications: No apparent anesthesia complications

## 2016-03-14 NOTE — Anesthesia Procedure Notes (Signed)
Procedure Name: LMA Insertion Date/Time: 03/14/2016 7:27 AM Performed by: Aline Brochure Pre-anesthesia Checklist: Patient identified, Emergency Drugs available, Suction available and Patient being monitored Patient Re-evaluated:Patient Re-evaluated prior to inductionOxygen Delivery Method: Circle system utilized Preoxygenation: Pre-oxygenation with 100% oxygen Intubation Type: IV induction Ventilation: Mask ventilation without difficulty LMA: LMA inserted LMA Size: 3.5 Number of attempts: 1 Placement Confirmation: breath sounds checked- equal and bilateral and positive ETCO2 Tube secured with: Tape Dental Injury: Teeth and Oropharynx as per pre-operative assessment

## 2016-03-14 NOTE — Op Note (Signed)
Preoperative diagnosis: Atypical lobular hyperplasia left breast.  Postoperative diagnosis: Same.  Operative procedure: Left breast wide excision with ultrasound guidance.  Operating surgeon: Hervey Ard, M.D.  Anesthesia: Gen. by LMA, Marcaine 0.5% with 1-200,000 epinephrine, 30 mL.  Assessment blood loss: Less than 5 mL.  Clinical note: This 80 year old woman had a biopsy showing evidence of a radial scar/complex sclerosing lesion with a focal area of atypical lobular hyperplasia. She is felt to be For wide excision.  Operative note: With the patient under adequate general anesthesia the area was prepped with ChloraPrep and draped. Ultrasound was used to identify the borders of the area of concern. A curvilinear incision in the upper inner quadrant of the breast centered on the 10:00 position was made and carried down through skin subtendinous tissue. The remaining dissection was completed with electrocautery. A 4 x 5 x 3 cm block of tissue was removed. Specimen radiograph confirmed the previously placed clip in place.  The breast tissue was elevated off the underlying pectoralis fascia. This was then approximated with interrupted 2-0 Vicryl figure-of-eight sutures. The adipose layer was approximated in a similar fashion. The skin was closed with a running 4-0 Vicryl subcuticular suture. Benzoin, Steri-Strips and a Telfa pad were applied. Fluff gauze and a surgical bra were placed.  The patient tolerated the procedure well and was taken to the recovery room in stable condition.

## 2016-03-14 NOTE — H&P (Signed)
No change in clinical history or exam. For excision of atypical lobular hyperplasia left breast.  Visible on ultrasound this am.

## 2016-03-14 NOTE — Discharge Instructions (Signed)

## 2016-03-17 ENCOUNTER — Telehealth: Payer: Self-pay | Admitting: General Surgery

## 2016-03-17 LAB — SURGICAL PATHOLOGY

## 2016-03-17 NOTE — Telephone Encounter (Signed)
Message left that the pathology was benign. She was encouraged to call to or for details. Follow-up as scheduled.

## 2016-03-25 ENCOUNTER — Encounter: Payer: Self-pay | Admitting: General Surgery

## 2016-03-25 ENCOUNTER — Ambulatory Visit (INDEPENDENT_AMBULATORY_CARE_PROVIDER_SITE_OTHER): Payer: Medicare Other | Admitting: General Surgery

## 2016-03-25 VITALS — BP 108/64 | HR 66 | Resp 12 | Ht 62.0 in | Wt 150.0 lb

## 2016-03-25 DIAGNOSIS — N6320 Unspecified lump in the left breast, unspecified quadrant: Secondary | ICD-10-CM

## 2016-03-25 NOTE — Patient Instructions (Signed)
Patient to return in ten months bilateral diagnotic mammogram.

## 2016-03-25 NOTE — Progress Notes (Signed)
Patient ID: Patricia Knox, female   DOB: 10/27/1935, 80 y.o.   MRN: [id number]  Chief Complaint  Patient presents with  . Routine Post Op    left lumpectomy     HPI Patricia Knox is a 80 y.o. female here today for her post op left lumpectomy done 03/14/16. Patient states she is doing well.  HPI  Past Medical History:  Diagnosis Date  . GERD (gastroesophageal reflux disease)   . Heart murmur   . History of pulmonary embolus (PE)   . Hyperlipidemia   . Hypertension     Past Surgical History:  Procedure Laterality Date  . BREAST BIOPSY Left 2015   NEG  . BREAST BIOPSY Left 01/30/2016   path pending  . BREAST LUMPECTOMY Left 03/14/2016   Procedure: BREAST LUMPECTOMY;  Surgeon: [name], MD;  Location: [facility] ORS;  Service: General;  Laterality: Left;  . 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  . COLONOSCOPY  2002  . TOE SURGERY  1999    Family History  Problem Relation Age of Onset  . Breast cancer Neg Hx     Social History Social History  Substance Use Topics  . Smoking status: Never Smoker  . Smokeless tobacco: Never Used  . Alcohol use No    Not on File  Current Outpatient Prescriptions  Medication Sig Dispense Refill  . alendronate (FOSAMAX) 70 MG tablet Take 70 mg by mouth every 7 (seven) days.     [name] aspirin 325 MG tablet Take 325 mg by mouth daily.    . Calcium Carbonate-Vitamin D (CALTRATE 600+D PO) Take 1 capsule by mouth 2 (two) times daily.    [name] HYDROcodone-acetaminophen (NORCO) 5-325 MG tablet Take 1 tablet by mouth every 4 (four) hours as needed for moderate pain. 20 tablet 0  . lisinopril-hydrochlorothiazide (PRINZIDE,ZESTORETIC) 20-12.5 MG per tablet Take 1 tablet by mouth daily.     [name] omeprazole (PRILOSEC) 20 MG capsule Take 20 mg by mouth every morning.     [name] rOPINIRole (REQUIP) 0.5 MG tablet Take 0.5 mg by mouth 2 (two) times daily.     . simvastatin (ZOCOR) 80 MG  tablet Take 80 mg by mouth daily at 6 PM.     . traZODone (DESYREL) 100 MG tablet Take 100 mg by mouth at bedtime.      No current facility-administered medications for this visit.     Review of Systems Review of Systems  Blood pressure 108/64, pulse 66, resp. rate 12, height 5\' 2"  (1.575 m), weight 150 lb (68 kg).  Physical Exam Physical Exam  Constitutional: She is oriented to person, place, and time. She appears well-developed and well-nourished.  Pulmonary/Chest:  Left breast incision site is clean and healing well.  Neurological: She is alert and oriented to person, place, and time.  Skin: Skin is warm and dry.    Data Reviewed 03/14/2016 wide excision:  A. BREAST, LEFT; WIDE EXCISION:  - USUAL DUCTAL HYPERPLASIA WITHOUT ATYPIA.  - RECENT BIOPSY SITE AND MARKER CLIP PRESENT.  - FAT NECROSIS AND SCARRING.  - NEGATIVE FOR MALIGNANCY 01/30/2016 core biopsy:  A. BREAST, LEFT, UPPER INNER QUADRANT; STEREOTACTIC CORE BIOPSY:  - INTRADUCTAL PAPILLOMA.  - FOCAL ATYPICAL LOBULAR HYPERPLASIA.  - POST-SURGICAL SCARRING AND FAT NECROSIS.   Assessment    No upstaging to LCIS or DCIS.    Plan    Based on her age and the focal findings  of atypical lobular hyperplasia I have not recommended that she be treated with tamoxifen or other antiestrogen. I think the risks of DVT outweigh any significant benefit.    Patient to return in ten months bilateral diagnotic mammogram.  This information has been scribed by Gaspar Cola CMA.  [name] 03/26/2016, 5:09 PM

## 2016-03-27 ENCOUNTER — Telehealth: Payer: Self-pay | Admitting: Neurology

## 2016-03-27 NOTE — Telephone Encounter (Signed)
Patricia Knox or [name], I had to tell patient not to come tomorrow. I have not been trained to perform botox for hemifacial spasm, only Dr. Rexene Alberts and Dr. Krista Blue do that for now. Can you call patient back tomorrow and get her rescheduled with Dr. Krista Blue or Rexene Alberts please?   Angie, Please don't charge her for a missed visit. Thanks !

## 2016-03-28 ENCOUNTER — Ambulatory Visit: Payer: Medicare Other | Admitting: Neurology

## 2016-03-28 NOTE — Telephone Encounter (Signed)
I did call patient and scheduled with Dr Krista Blue thanks [place]

## 2016-04-15 ENCOUNTER — Telehealth: Payer: Self-pay | Admitting: Neurology

## 2016-04-15 ENCOUNTER — Encounter: Payer: Self-pay | Admitting: Neurology

## 2016-04-15 ENCOUNTER — Ambulatory Visit (INDEPENDENT_AMBULATORY_CARE_PROVIDER_SITE_OTHER): Payer: Medicare Other | Admitting: Neurology

## 2016-04-15 DIAGNOSIS — R252 Cramp and spasm: Secondary | ICD-10-CM | POA: Diagnosis not present

## 2016-04-15 DIAGNOSIS — G5139 Clonic hemifacial spasm, unspecified: Secondary | ICD-10-CM

## 2016-04-15 DIAGNOSIS — G513 Clonic hemifacial spasm: Secondary | ICD-10-CM

## 2016-04-15 MED ORDER — GABAPENTIN 100 MG PO CAPS: 300.0000 mg | ORAL_CAPSULE | Freq: Every day | ORAL | 11 refills | Status: DC

## 2016-04-15 NOTE — Telephone Encounter (Signed)
Dr Krista Blue wants Patricia Knox to have first Xeomin in 4 weeks. Please contact Patricia Knox to schedule.

## 2016-04-15 NOTE — Progress Notes (Signed)
PATIENT: Patricia Knox DOB: 11/16/1935  Chief Complaint  Patient presents with  . Hemifacial Spasms    She has been having left-sided hemifacial spasms for several years.  She has previously had success with Botox injections at [facility] (her insurance will no longer cover the facility).  She is here to discuss a continuation of care.  Her last injections were in July 2017.  [name] PCP    [name], MD     HISTORICAL  [name] is a 80 year old right-handed female, seen in refer by her primary care doctor  [name] for continued EMG guided injection for her left hemifacial spasm.  She had a gradual onset left hemifacial spasm for many years,  Left facial twitching since 2017, [place], last injection wasi nJuly 2017, since May 2015,she began to receive EMG guided incobotulism toxin a by Dr. Narda Bonds note at [facility] works very well for her, last injection was in July 2017,used total 27.5 units, but because of the insurance reason she has to switch to different practice,she hopes to continue injection through our office.  She had a history of restless leg syndrome, complains of frequent bilateral calf muscle cramping, increased recently, woke her up few times each night, despite taking Requip 0.5 milligrams twice a day,  REVIEW OF SYSTEMS: Full 14 system review of systems performed and notable only for restless leg, cramps,  ALLERGIES: Not on File  HOME MEDICATIONS: Current Outpatient Prescriptions  Medication Sig Dispense Refill  . alendronate (FOSAMAX) 70 MG tablet Take 70 mg by mouth every 7 (seven) days.     [name] aspirin 325 MG tablet Take 325 mg by mouth daily.    . Calcium Carbonate-Vitamin D (CALTRATE 600+D PO) Take 1 capsule by mouth 2 (two) times daily.    [name] HYDROcodone-acetaminophen (NORCO) 5-325 MG tablet Take 1 tablet by mouth every 4 (four) hours as needed for moderate pain. 20 tablet 0  . lisinopril-hydrochlorothiazide (PRINZIDE,ZESTORETIC) 20-12.5 MG per  tablet Take 1 tablet by mouth daily.     [name] omeprazole (PRILOSEC) 20 MG capsule Take 20 mg by mouth every morning.     [name] rOPINIRole (REQUIP) 0.5 MG tablet Take 0.5 mg by mouth 2 (two) times daily.     . simvastatin (ZOCOR) 80 MG tablet Take 80 mg by mouth daily at 6 PM.     . traZODone (DESYREL) 100 MG tablet Take 100 mg by mouth at bedtime.      No current facility-administered medications for this visit.     PAST MEDICAL HISTORY: Past Medical History:  Diagnosis Date  . GERD (gastroesophageal reflux disease)   . Heart murmur   . Hemifacial spasm    Left   . History of pulmonary embolus (PE)   . Hyperlipidemia   . Hypertension   . Restless leg syndrome     PAST SURGICAL HISTORY: Past Surgical History:  Procedure Laterality Date  . BREAST BIOPSY Left 2015   NEG  . BREAST BIOPSY Left 01/30/2016   path pending  . BREAST LUMPECTOMY Left 03/14/2016   Procedure: BREAST LUMPECTOMY;  Surgeon: [name], MD;  Location: [facility] ORS;  Service: General;  Laterality: Left;  . 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  . COLONOSCOPY  2002  . TOE SURGERY  1999    FAMILY HISTORY: Family History  Problem Relation Age of Onset  . Stroke Mother   .  Heart attack Father   . Breast cancer Neg Hx     SOCIAL HISTORY:  Social History   Social History  . Marital status: Single    Spouse name: N/A  . Number of children: 3  . Years of education: HS   Occupational History  . Retired    Social History Main Topics  . Smoking status: Never Smoker  . Smokeless tobacco: Never Used  . Alcohol use No  . Drug use: No  . Sexual activity: Not on file   Other Topics Concern  . Not on file   Social History Narrative   Lives at home with husband.   Right-handed.   No daily caffeine use.     PHYSICAL EXAM   Vitals:   04/15/16 1429  BP: 113/72  Pulse: (!) 58  Weight: 149 lb 12 oz (67.9 kg)  Height:  5\' 2"  (1.575 m)    Not recorded      Body mass index is 27.39 kg/m.  PHYSICAL EXAMNIATION:  Gen: NAD, conversant, well nourised, obese, well groomed                     Cardiovascular: Regular rate rhythm, no peripheral edema, warm, nontender. Eyes: Conjunctivae clear without exudates or hemorrhage Neck: Supple, no carotid bruits. Pulmonary: Clear to auscultation bilaterally   NEUROLOGICAL EXAM:  MENTAL STATUS: Speech:    Speech is normal; fluent and spontaneous with normal comprehension.  Cognition:     Orientation to time, place and person     Normal recent and remote memory     Normal Attention span and concentration     Normal Language, naming, repeating,spontaneous speech     Fund of knowledge   CRANIAL NERVES: CN II: Visual fields are full to confrontation. Fundoscopic exam is normal with sharp discs and no vascular changes. Pupils are round equal and briskly reactive to light. CN III, IV, VI: extraocular movement are normal. No ptosis. CN V: Facial sensation is intact to pinprick in all 3 divisions bilaterally. Corneal responses are intact.  CN VII: she has frequent left hemifacial spasm, involving left orbicularis oculi, occasionally left cheek, and the orbicularis auris muscles. Mild asymmetry of left face, CN VIII: Hearing is normal to rubbing fingers CN IX, X: Palate elevates symmetrically. Phonation is normal. CN XI: Head turning and shoulder shrug are intact CN XII: Tongue is midline with normal movements and no atrophy.  MOTOR: There is no pronator drift of out-stretched arms. Muscle bulk and tone are normal. Muscle strength is normal.  REFLEXES: Reflexes are 2+ and symmetric at the biceps, triceps, knees, and ankles. Plantar responses are flexor.  SENSORY: Intact to light touch, pinprick, positional sensation and vibratory sensation are intact in fingers and toes.  COORDINATION: Rapid alternating movements and fine finger movements are intact. There is  no dysmetria on finger-to-nose and heel-knee-shin.    GAIT/STANCE: Posture is normal. Gait is steady with normal steps, base, arm swing, and turning. Heel and toe walking are normal. Tandem gait is normal.  Romberg is absent.   DIAGNOSTIC DATA (LABS, IMAGING, TESTING) - I reviewed patient records, labs, notes, testing and imaging myself where available.   ASSESSMENT AND PLAN  Leo JARLENE ACIERNO is a 80 y.o. female   Left hemifacial spasm  preauthorization for EMG guided xeomin injection, ask for 50 units, Restless leg, bilateral calf muscle cramp  Add on Neurontin 100mg  titrating to 3 tabs po qhs Hyperlipidemia,  High-dose Zocor treatment, 80 mg every  night,  Marcial Pacas, M.D. Ph.D.  [facility] [address], [place], [place] [address] Ph: [phone] Fax: [phone]  CC: [name], MD

## 2016-04-16 NOTE — Telephone Encounter (Signed)
Pt's appt scheduled for 12/27 @ 4pm

## 2016-04-16 NOTE — Telephone Encounter (Signed)
Would you mind calling her to schedule the end of December?

## 2016-05-28 ENCOUNTER — Encounter: Payer: Self-pay | Admitting: Neurology

## 2016-05-28 ENCOUNTER — Telehealth: Payer: Self-pay | Admitting: Neurology

## 2016-05-28 ENCOUNTER — Ambulatory Visit (INDEPENDENT_AMBULATORY_CARE_PROVIDER_SITE_OTHER): Payer: Medicare Other | Admitting: Neurology

## 2016-05-28 VITALS — BP 118/71 | HR 71 | Ht 62.0 in | Wt 152.5 lb

## 2016-05-28 DIAGNOSIS — G513 Clonic hemifacial spasm: Secondary | ICD-10-CM

## 2016-05-28 DIAGNOSIS — G5139 Clonic hemifacial spasm, unspecified: Secondary | ICD-10-CM

## 2016-05-28 MED ORDER — INCOBOTULINUMTOXINA 50 UNITS IM SOLR
50.0000 [IU] | INTRAMUSCULAR | Status: DC
  Administered 2016-05-28: 50 [IU] via INTRAMUSCULAR

## 2016-05-28 NOTE — Progress Notes (Signed)
PATIENT: Patricia Knox DOB: [birth year]  Chief Complaint  Patient presents with  . Hemifacial Spasm    Xeomin 50 units - office supply     HISTORICAL  Patricia Knox is a 80 year old right-handed female, seen in refer by her primary care doctor  [name] for continued EMG guided injection for her left hemifacial spasm.  She had a gradual onset left hemifacial spasm for many years,  Left facial twitching since 2017, [place], last injection wasi nJuly 2017, since May 2015,she began to receive EMG guided incobotulism toxin a by Dr. Narda Bonds note at [facility] works very well for her, last injection was in July 2017,used total 27.5 units, but because of the insurance reason she has to switch to different practice,she hopes to continue injection through our office.  She had a history of restless leg syndrome, complains of frequent bilateral calf muscle cramping, increased recently, woke her up few times each night, despite taking Requip 0.5 milligrams twice a day,  UPDATE Dec 27th 2017: She came in for EMG guided xeomin injection for left hemifacial spasm today, potential side effect pain  REVIEW OF SYSTEMS: Full 14 system review of systems performed and notable only for restless leg, cramps,  ALLERGIES: Not on File  HOME MEDICATIONS: Current Outpatient Prescriptions  Medication Sig Dispense Refill  . alendronate (FOSAMAX) 70 MG tablet Take 70 mg by mouth every 7 (seven) days.     [name] aspirin 325 MG tablet Take 325 mg by mouth daily.    . Calcium Carbonate-Vitamin D (CALTRATE 600+D PO) Take 1 capsule by mouth 2 (two) times daily.    [name] gabapentin (NEURONTIN) 100 MG capsule Take 3 capsules (300 mg total) by mouth at bedtime. 90 capsule 11  . HYDROcodone-acetaminophen (NORCO) 5-325 MG tablet Take 1 tablet by mouth every 4 (four) hours as needed for moderate pain. 20 tablet 0  . incobotulinumtoxinA (XEOMIN) 50 units SOLR injection Inject 50 Units into the muscle every 3  (three) months.    [name] lisinopril-hydrochlorothiazide (PRINZIDE,ZESTORETIC) 20-12.5 MG per tablet Take 1 tablet by mouth daily.     [name] omeprazole (PRILOSEC) 20 MG capsule Take 20 mg by mouth every morning.     [name] rOPINIRole (REQUIP) 0.5 MG tablet Take 0.5 mg by mouth 2 (two) times daily.     . simvastatin (ZOCOR) 80 MG tablet Take 80 mg by mouth daily at 6 PM.     . traZODone (DESYREL) 100 MG tablet Take 100 mg by mouth at bedtime.      No current facility-administered medications for this visit.     PAST MEDICAL HISTORY: Past Medical History:  Diagnosis Date  . GERD (gastroesophageal reflux disease)   . Heart murmur   . Hemifacial spasm    Left   . History of pulmonary embolus (PE)   . Hyperlipidemia   . Hypertension   . Restless leg syndrome     PAST SURGICAL HISTORY: Past Surgical History:  Procedure Laterality Date  . BREAST BIOPSY Left 2015   NEG  . BREAST BIOPSY Left 01/30/2016   path pending  . BREAST LUMPECTOMY Left 03/14/2016   Procedure: BREAST LUMPECTOMY;  Surgeon: [name], MD;  Location: [facility] ORS;  Service: General;  Laterality: Left;  . 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  . COLONOSCOPY  2002  . TOE SURGERY  1999    FAMILY HISTORY: Family History  Problem  Relation Age of Onset  . Stroke Mother   . Heart attack Father   . Breast cancer Neg Hx     SOCIAL HISTORY:  Social History   Social History  . Marital status: Single    Spouse name: N/A  . Number of children: 3  . Years of education: HS   Occupational History  . Retired    Social History Main Topics  . Smoking status: Never Smoker  . Smokeless tobacco: Never Used  . Alcohol use No  . Drug use: No  . Sexual activity: Not on file   Other Topics Concern  . Not on file   Social History Narrative   Lives at home with husband.   Right-handed.   No daily caffeine use.     PHYSICAL EXAM   Vitals:     05/28/16 1518  BP: 118/71  Pulse: 71  Weight: 152 lb 8 oz (69.2 kg)  Height: 5\' 2"  (1.575 m)    Not recorded      Body mass index is 27.89 kg/m.  PHYSICAL EXAMNIATION:  Gen: NAD, conversant, well nourised, obese, well groomed                     Cardiovascular: Regular rate rhythm, no peripheral edema, warm, nontender. Eyes: Conjunctivae clear without exudates or hemorrhage Neck: Supple, no carotid bruits. Pulmonary: Clear to auscultation bilaterally   NEUROLOGICAL EXAM:  MENTAL STATUS: Speech:    Speech is normal; fluent and spontaneous with normal comprehension.  Cognition:     Orientation to time, place and person     Normal recent and remote memory     Normal Attention span and concentration     Normal Language, naming, repeating,spontaneous speech     Fund of knowledge   CRANIAL NERVES: CN II: Visual fields are full to confrontation. Fundoscopic exam is normal with sharp discs and no vascular changes. Pupils are round equal and briskly reactive to light. CN III, IV, VI: extraocular movement are normal. No ptosis. CN V: Facial sensation is intact to pinprick in all 3 divisions bilaterally. Corneal responses are intact.  CN VII: she has frequent left hemifacial spasm, involving left orbicularis oculi, occasionally left cheek, and the orbicularis auris muscles. Mild asymmetry of left face, CN VIII: Hearing is normal to rubbing fingers CN IX, X: Palate elevates symmetrically. Phonation is normal. CN XI: Head turning and shoulder shrug are intact CN XII: Tongue is midline with normal movements and no atrophy.  MOTOR: There is no pronator drift of out-stretched arms. Muscle bulk and tone are normal. Muscle strength is normal.  REFLEXES: Reflexes are 2+ and symmetric at the biceps, triceps, knees, and ankles. Plantar responses are flexor.  SENSORY: Intact to light touch, pinprick, positional sensation and vibratory sensation are intact in fingers and  toes.  COORDINATION: Rapid alternating movements and fine finger movements are intact. There is no dysmetria on finger-to-nose and heel-knee-shin.    GAIT/STANCE: Posture is normal. Gait is steady with normal steps, base, arm swing, and turning. Heel and toe walking are normal. Tandem gait is normal.  Romberg is absent.   DIAGNOSTIC DATA (LABS, IMAGING, TESTING) - I reviewed patient records, labs, notes, testing and imaging myself where available.   ASSESSMENT AND PLAN  Patricia Knox is a 80 y.o. female   Left hemifacial spasm Xeomin 50 units, dissolved into 1 cc of NS.  Left orbicularis oculi at 2, 3, 4, 5, 6, 8:00 (2.5 unitsx6=15 units) total Right  orbicularis oculi at 8, 9 (2.5unitsx2=5 Units)  Left corrugate 5 Right corrugate 5 procereus 5  Left frontalis 5 units  Left platysmus  10 units  She will return to clinic in 3 months for repeat injection  Restless leg, bilateral calf muscle cramp  Add on Neurontin 100mg  titrating to 3 tabs po qhs Hyperlipidemia,  High-dose Zocor treatment, 80 mg every night,  Marcial Pacas, M.D. Ph.D.  [facility] [address] [place], [place] [address] Ph: [phone] Fax: [phone]  CC: [name], MD

## 2016-05-28 NOTE — Progress Notes (Signed)
**  Xeomin 50 units x 1 vial, Lot [id number], Exp 07/2017, [place] [id number], office supply.//mck,rn**

## 2016-05-28 NOTE — Telephone Encounter (Signed)
Please schedule Xeomin in 3 months. [phone].

## 2016-06-05 NOTE — Telephone Encounter (Signed)
Pt's appt is scheduled for 09/03/16 @ 4pm

## 2016-06-05 NOTE — Telephone Encounter (Signed)
I called the patient and left a VM asking her to call me back to schedule her apt.

## 2016-08-28 ENCOUNTER — Telehealth: Payer: Self-pay | Admitting: Neurology

## 2016-08-28 NOTE — Telephone Encounter (Signed)
Patricia Knox with [name] called office requesting new prescription for Botox.  Need specific amount to be dispensed to patient and the dispense size being 100mg  or 200mg .  Fax #- [phone].

## 2016-09-03 ENCOUNTER — Ambulatory Visit: Payer: Medicare Other | Admitting: Neurology

## 2016-09-04 NOTE — Telephone Encounter (Signed)
Late entry;   I called [facility] and clarified information.

## 2016-09-10 ENCOUNTER — Telehealth: Payer: Self-pay | Admitting: Neurology

## 2016-09-10 NOTE — Telephone Encounter (Signed)
1. I spoke with a representative that transferred me.  2. I spoke with another rep who transferred me.  3. I spoke with Patricia Knox w/ the pharmacy who stated the she could not see anything regarding the PA and appeal submitted. She said they would not be able to see anything until a decision had been made. She then transferred me again to a different department.  4. Spoke with Patricia Knox. [phone]) who told me that this was the appeals line. Patricia Knox stated that she could not see anything on the PA number I provided to her. She stated that they had transferred me to the medical line, this is not correct because Xeomin would go through pharmacy benefits. She suggested initiating a new PA through medical benefits, we did so for botox because that was the preferred medication. The reference number for this PA was [id number]. 5. I called the pharmacy back and spoke with Patricia Knox, I gave her the PA number of the original request and Patricia Knox stated that it was denied. She is going to send a form for the appeal to fill out.

## 2016-09-12 NOTE — Telephone Encounter (Signed)
I called and spoke with a representative from [employer] who stated that the medication was still not approved. It is being rejected through the pharmacy benefits because they request the patient uses botox instead of Xeomin. I called the plan directly and confirmed that the codes were billable and PA's werent needed. The patient can receive xeomin injections but must be B/B.

## 2016-09-15 ENCOUNTER — Telehealth: Payer: Self-pay | Admitting: *Deleted

## 2016-09-15 ENCOUNTER — Ambulatory Visit: Payer: Medicare Other | Admitting: Neurology

## 2016-09-15 NOTE — Telephone Encounter (Signed)
Need to call to reschedule Xeomin injection due to power outage.

## 2016-09-15 NOTE — Telephone Encounter (Addendum)
Spoke to patient - she has been rescheduled to 09/25/16 for her Xeomin injection.

## 2016-09-25 ENCOUNTER — Encounter: Payer: Self-pay | Admitting: Neurology

## 2016-09-25 ENCOUNTER — Ambulatory Visit (INDEPENDENT_AMBULATORY_CARE_PROVIDER_SITE_OTHER): Payer: Medicare Other | Admitting: Neurology

## 2016-09-25 VITALS — BP 109/74 | HR 64 | Ht 62.0 in | Wt 153.0 lb

## 2016-09-25 DIAGNOSIS — G5139 Clonic hemifacial spasm, unspecified: Secondary | ICD-10-CM

## 2016-09-25 DIAGNOSIS — G513 Clonic hemifacial spasm: Secondary | ICD-10-CM | POA: Diagnosis not present

## 2016-09-25 MED ORDER — INCOBOTULINUMTOXINA 50 UNITS IM SOLR
50.0000 [IU] | INTRAMUSCULAR | Status: DC
  Administered 2016-09-25: 50 [IU] via INTRAMUSCULAR

## 2016-09-25 NOTE — Progress Notes (Signed)
PATIENT: Patricia Knox DOB: 01/24/1936  Chief Complaint  Patient presents with  . Hemifacial Spasms    Xeomin 50 units - office supply     HISTORICAL  Patricia Knox is a 81 year old right-handed female, seen in refer by her primary care doctor  [name] for continued EMG guided injection for her left hemifacial spasm.  She had a gradual onset left hemifacial spasm for many years,  She began to receive EMG guided incobotulism toxin a by Dr. Sherley Bounds at [facility] works very well for her, last injection was in July 2017,used total 27.5 units, but because of the insurance reason she has to switch to different practice,she hopes to continue injection through our office.  She had a history of restless leg syndrome, complains of frequent bilateral calf muscle cramping, increased recently, woke her up few times each night, despite taking Requip 0.5 milligrams twice a day,  UPDATE Dec 27th 2017: She came in for EMG guided xeomin injection for left hemifacial spasm today, potential side effect pain  UPDATE September 25 2016: She had suboptimal response to previous injection, noticed left cheek area twitching, no significant side effect noticed,  Gabapentin 100 mg 3 tablets every night has worked well for her nighttime leg cramping.  REVIEW OF SYSTEMS: Full 14 system review of systems performed and notable only for restless leg, cramps,  ALLERGIES: Not on File  HOME MEDICATIONS: Current Outpatient Prescriptions  Medication Sig Dispense Refill  . alendronate (FOSAMAX) 70 MG tablet Take 70 mg by mouth every 7 (seven) days.     [name] aspirin 325 MG tablet Take 325 mg by mouth daily.    . Calcium Carbonate-Vitamin D (CALTRATE 600+D PO) Take 1 capsule by mouth 2 (two) times daily.    [name] gabapentin (NEURONTIN) 100 MG capsule Take 3 capsules (300 mg total) by mouth at bedtime. 90 capsule 11  . HYDROcodone-acetaminophen (NORCO) 5-325 MG tablet Take 1 tablet by mouth every 4  (four) hours as needed for moderate pain. 20 tablet 0  . incobotulinumtoxinA (XEOMIN) 50 units SOLR injection Inject 50 Units into the muscle every 3 (three) months.    [name] lisinopril-hydrochlorothiazide (PRINZIDE,ZESTORETIC) 20-12.5 MG per tablet Take 1 tablet by mouth daily.     [name] omeprazole (PRILOSEC) 20 MG capsule Take 20 mg by mouth every morning.     [name] rOPINIRole (REQUIP) 0.5 MG tablet Take 0.5 mg by mouth 2 (two) times daily.     . simvastatin (ZOCOR) 80 MG tablet Take 80 mg by mouth daily at 6 PM.     . traZODone (DESYREL) 100 MG tablet Take 100 mg by mouth at bedtime.      No current facility-administered medications for this visit.     PAST MEDICAL HISTORY: Past Medical History:  Diagnosis Date  . GERD (gastroesophageal reflux disease)   . Heart murmur   . Hemifacial spasm    Left   . History of pulmonary embolus (PE)   . Hyperlipidemia   . Hypertension   . Restless leg syndrome     PAST SURGICAL HISTORY: Past Surgical History:  Procedure Laterality Date  . BREAST BIOPSY Left 2015   NEG  . BREAST BIOPSY Left 01/30/2016   path pending  . BREAST LUMPECTOMY Left 03/14/2016   Procedure: BREAST LUMPECTOMY;  Surgeon: [name], MD;  Location: [facility] ORS;  Service: General;  Laterality: Left;  . BREAST SURGERY Left January 2015   Pathology as noted above showed an intraductal  papillary neoplasm with sclerosis.  . COLONOSCOPY  2002  . TOE SURGERY  1999    FAMILY HISTORY: Family History  Problem Relation Age of Onset  . Stroke Mother   . Heart attack Father   . Breast cancer Neg Hx     SOCIAL HISTORY:  Social History   Social History  . Marital status: Single    Spouse name: N/A  . Number of children: 3  . Years of education: HS   Occupational History  . Retired    Social History Main Topics  . Smoking status: Never Smoker  . Smokeless tobacco: Never Used  . Alcohol use No  . Drug use: No  . Sexual activity: Not on file   Other Topics Concern    . Not on file   Social History Narrative   Lives at home with husband.   Right-handed.   No daily caffeine use.     PHYSICAL EXAM   Vitals:   09/25/16 0758  BP: 109/74  Pulse: 64  Weight: 153 lb (69.4 kg)  Height: 5\' 2"  (1.575 m)    Not recorded      Body mass index is 27.98 kg/m.  PHYSICAL EXAMNIATION:  Gen: NAD, conversant, well nourised, obese, well groomed                     Cardiovascular: Regular rate rhythm, no peripheral edema, warm, nontender. Eyes: Conjunctivae clear without exudates or hemorrhage Neck: Supple, no carotid bruits. Pulmonary: Clear to auscultation bilaterally   NEUROLOGICAL EXAM:  MENTAL STATUS: Speech:    Speech is normal; fluent and spontaneous with normal comprehension.  Cognition:     Orientation to time, place and person     Normal recent and remote memory     Normal Attention span and concentration     Normal Language, naming, repeating,spontaneous speech     Fund of knowledge   CRANIAL NERVES: CN II: Visual fields are full to confrontation. Fundoscopic exam is normal with sharp discs and no vascular changes. Pupils are round equal and briskly reactive to light. CN III, IV, VI: extraocular movement are normal. No ptosis. CN V: Facial sensation is intact to pinprick in all 3 divisions bilaterally. Corneal responses are intact.  CN VII: she has frequent left hemifacial spasm, involving left orbicularis oculi, occasionally left cheek, and the orbicularis auris muscles. Mild asymmetry of left face, CN VIII: Hearing is normal to rubbing fingers CN IX, X: Palate elevates symmetrically. Phonation is normal. CN XI: Head turning and shoulder shrug are intact CN XII: Tongue is midline with normal movements and no atrophy.  MOTOR: There is no pronator drift of out-stretched arms. Muscle bulk and tone are normal. Muscle strength is normal.  REFLEXES: Reflexes are 2+ and symmetric at the biceps, triceps, knees, and ankles. Plantar  responses are flexor.  SENSORY: Intact to light touch, pinprick, positional sensation and vibratory sensation are intact in fingers and toes.  COORDINATION: Rapid alternating movements and fine finger movements are intact. There is no dysmetria on finger-to-nose and heel-knee-shin.    GAIT/STANCE: Posture is normal. Gait is steady with normal steps, base, arm swing, and turning. Heel and toe walking are normal. Tandem gait is normal.  Romberg is absent.   DIAGNOSTIC DATA (LABS, IMAGING, TESTING) - I reviewed patient records, labs, notes, testing and imaging myself where available.   ASSESSMENT AND PLAN  Patricia Knox is a 81 y.o. female   Left hemifacial spasm Xeomin 50 units,  dissolved into 1 cc of NS.  Left orbicularis oculi at 2, 3, 4, 5, 6, 8:00 (2.5 unitsx6=15 units) total Right orbicularis oculi at 8, 9 (2.5unitsx2=5 Units)  Left frontalis 10 units Left platysmas 10 units  Left zygomatic major 5 units Left nasalis 2.5 units Left levator angularis 2.5 units  Left platysmus  10 units  She will return to clinic in 3 months for repeat injection  Restless leg, bilateral calf muscle cramp  Add on Neurontin 100mg  titrating to 3 tabs po qhs  Hyperlipidemia,  High-dose Zocor treatment, 80 mg every night,  Marcial Pacas, M.D. Ph.D.  [facility] [address], [place], [place] [address] Ph: [phone] Fax: [phone]  CC: [name], MD

## 2016-09-25 NOTE — Progress Notes (Signed)
**  Xeomin 50 units, [place] 0259-1605-01, Lot [id number], Exp 11/2017, office supply.//mck,rn**

## 2016-09-25 NOTE — Telephone Encounter (Signed)
Per Dr. Krista Blue, pt needs another guided xeomin injection in 3 months for left hemifacial spasm.

## 2016-11-12 ENCOUNTER — Other Ambulatory Visit: Payer: Self-pay

## 2016-11-12 DIAGNOSIS — N6099 Unspecified benign mammary dysplasia of unspecified breast: Secondary | ICD-10-CM

## 2017-01-07 ENCOUNTER — Encounter: Payer: Self-pay | Admitting: Neurology

## 2017-01-07 ENCOUNTER — Telehealth: Payer: Self-pay | Admitting: Neurology

## 2017-01-07 ENCOUNTER — Ambulatory Visit (INDEPENDENT_AMBULATORY_CARE_PROVIDER_SITE_OTHER): Payer: Medicare Other | Admitting: Neurology

## 2017-01-07 VITALS — BP 111/71 | HR 71 | Ht 62.0 in | Wt 151.5 lb

## 2017-01-07 DIAGNOSIS — G513 Clonic hemifacial spasm: Secondary | ICD-10-CM

## 2017-01-07 DIAGNOSIS — G5139 Clonic hemifacial spasm, unspecified: Secondary | ICD-10-CM

## 2017-01-07 NOTE — Progress Notes (Signed)
Xeomin b/b [facility] 5427-0623-76,  Lot [id number]  Exp 11/2017.

## 2017-01-07 NOTE — Progress Notes (Signed)
PATIENT: Patricia Knox DOB: Sep 30, 1935  Chief Complaint  Patient presents with  . Xeomin Injection     HISTORICAL  Patricia Knox is a 81 year old right-handed female, seen in refer by her primary care doctor  [name] for continued EMG guided injection for her left hemifacial spasm.  She had a gradual onset left hemifacial spasm for many years,  She began to receive EMG guided incobotulism toxin a by Dr. Sherley Bounds at [facility] works very well for her, last injection was in July 2017,used total 27.5 units, but because of the insurance reason she has to switch to different practice,she hopes to continue injection through our office.  She had a history of restless leg syndrome, complains of frequent bilateral calf muscle cramping, increased recently, woke her up few times each night, despite taking Requip 0.5 milligrams twice a day,  UPDATE Dec 27th 2017: She came in for EMG guided xeomin injection for left hemifacial spasm today, potential side effect pain  UPDATE September 25 2016: She had suboptimal response to previous injection, noticed left cheek area twitching, no significant side effect noticed,  Gabapentin 100 mg 3 tablets every night has worked well for her nighttime leg cramping.  UPDATE January 07 2017:  she responded very well to previous injection, no significant side effect noted  REVIEW OF SYSTEMS: Full 14 system review of systems performed and notable only for restless leg, cramps,  ALLERGIES: No Known Allergies  HOME MEDICATIONS: Current Outpatient Prescriptions  Medication Sig Dispense Refill  . alendronate (FOSAMAX) 70 MG tablet Take 70 mg by mouth every 7 (seven) days.     [name] aspirin 325 MG tablet Take 325 mg by mouth daily.    . Calcium Carbonate-Vitamin D (CALTRATE 600+D PO) Take 1 capsule by mouth 2 (two) times daily.    [name] gabapentin (NEURONTIN) 100 MG capsule Take 3 capsules (300 mg total) by mouth at bedtime. 90 capsule 11  .  HYDROcodone-acetaminophen (NORCO) 5-325 MG tablet Take 1 tablet by mouth every 4 (four) hours as needed for moderate pain. 20 tablet 0  . incobotulinumtoxinA (XEOMIN) 50 units SOLR injection Inject 50 Units into the muscle every 3 (three) months.    [name] lisinopril-hydrochlorothiazide (PRINZIDE,ZESTORETIC) 20-12.5 MG per tablet Take 1 tablet by mouth daily.     [name] omeprazole (PRILOSEC) 20 MG capsule Take 20 mg by mouth every morning.     [name] rOPINIRole (REQUIP) 0.5 MG tablet Take 0.5 mg by mouth 2 (two) times daily.     . simvastatin (ZOCOR) 80 MG tablet Take 80 mg by mouth daily at 6 PM.     . traZODone (DESYREL) 100 MG tablet Take 100 mg by mouth at bedtime.      Current Facility-Administered Medications  Medication Dose Route Frequency Provider Last Rate Last Dose  . incobotulinumtoxinA (XEOMIN) 50 units injection 50 Units  50 Units Intramuscular Q90 days Marcial Pacas, MD   50 Units at 09/25/16 0831    PAST MEDICAL HISTORY: Past Medical History:  Diagnosis Date  . GERD (gastroesophageal reflux disease)   . Heart murmur   . Hemifacial spasm    Left   . History of pulmonary embolus (PE)   . Hyperlipidemia   . Hypertension   . Restless leg syndrome     PAST SURGICAL HISTORY: Past Surgical History:  Procedure Laterality Date  . BREAST BIOPSY Left 2015   NEG  . BREAST BIOPSY Left 01/30/2016   path pending  . BREAST LUMPECTOMY  Left 03/14/2016   Procedure: BREAST LUMPECTOMY;  Surgeon: [name], MD;  Location: [facility] ORS;  Service: General;  Laterality: Left;  . 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  . COLONOSCOPY  2002  . TOE SURGERY  1999    FAMILY HISTORY: Family History  Problem Relation Age of Onset  . Stroke Mother   . Heart attack Father   . Breast cancer Neg Hx     SOCIAL HISTORY:  Social History   Social History  . Marital status: Single    Spouse name: N/A  . Number of children:  3  . Years of education: HS   Occupational History  . Retired    Social History Main Topics  . Smoking status: Never Smoker  . Smokeless tobacco: Never Used  . Alcohol use No  . Drug use: No  . Sexual activity: Not on file   Other Topics Concern  . Not on file   Social History Narrative   Lives at home with husband.   Right-handed.   No daily caffeine use.     PHYSICAL EXAM   Vitals:   01/07/17 1326  BP: 111/71  Pulse: 71  Weight: 151 lb 8 oz (68.7 kg)  Height: 5\' 2"  (1.575 m)    Not recorded      Body mass index is 27.71 kg/m.  PHYSICAL EXAMNIATION:  Gen: NAD, conversant, well nourised, obese, well groomed                     Cardiovascular: Regular rate rhythm, no peripheral edema, warm, nontender. Eyes: Conjunctivae clear without exudates or hemorrhage Neck: Supple, no carotid bruits. Pulmonary: Clear to auscultation bilaterally   NEUROLOGICAL EXAM:  MENTAL STATUS: Speech:    Speech is normal; fluent and spontaneous with normal comprehension.  Cognition:     Orientation to time, place and person     Normal recent and remote memory     Normal Attention span and concentration     Normal Language, naming, repeating,spontaneous speech     Fund of knowledge   CRANIAL NERVES: CN II: Visual fields are full to confrontation. Fundoscopic exam is normal with sharp discs and no vascular changes. Pupils are round equal and briskly reactive to light. CN III, IV, VI: extraocular movement are normal. No ptosis. CN V: Facial sensation is intact to pinprick in all 3 divisions bilaterally. Corneal responses are intact.  CN VII: she has frequent left hemifacial spasm, involving left orbicularis oculi, occasionally left cheek, and the orbicularis auris muscles. Mild asymmetry of left face, CN VIII: Hearing is normal to rubbing fingers CN IX, X: Palate elevates symmetrically. Phonation is normal. CN XI: Head turning and shoulder shrug are intact CN XII: Tongue is  midline with normal movements and no atrophy.  MOTOR: There is no pronator drift of out-stretched arms. Muscle bulk and tone are normal. Muscle strength is normal.  REFLEXES: Reflexes are 2+ and symmetric at the biceps, triceps, knees, and ankles. Plantar responses are flexor.  SENSORY: Intact to light touch, pinprick, positional sensation and vibratory sensation are intact in fingers and toes.  COORDINATION: Rapid alternating movements and fine finger movements are intact. There is no dysmetria on finger-to-nose and heel-knee-shin.    GAIT/STANCE: Posture is normal. Gait is steady with normal steps, base, arm swing, and turning. Heel and toe walking are normal. Tandem gait is normal.  Romberg is absent.  DIAGNOSTIC DATA (LABS, IMAGING, TESTING) - I reviewed patient records, labs, notes, testing and imaging myself where available.   ASSESSMENT AND PLAN  Patricia Knox is a 81 y.o. female   Left hemifacial spasm Xeomin 50 units, dissolved into 1 cc of NS. Used 42.5 units, discard 7.5 units  Left orbicularis oculi at 2, 3, 4, 5, 6, 8:00 (2.5 unitsx6=15 units) total Right orbicularis oculi at 8, 9 (2.5unitsx2=5 Units)  Left frontalis 5  units Left platysmas 10 units  Left zygomatic major 5 units Left levator angularis 2.5 units  She will return to clinic in 3 months for repeat injection  Restless leg, bilateral calf muscle cramp  Add on Neurontin 100mg  titrating to 3 tabs po qhs  Hyperlipidemia,  High-dose Zocor treatment, 80 mg every night,  Marcial Pacas, M.D. Ph.D.  [facility] [address], [place], [place] [address] Ph: [phone] Fax: [phone]  CC: [name], MD

## 2017-01-07 NOTE — Telephone Encounter (Signed)
Please call pt to schedule appt

## 2017-01-09 ENCOUNTER — Ambulatory Visit
Admission: RE | Admit: 2017-01-09 | Discharge: 2017-01-09 | Disposition: A | Discharge status: Home / Self Care | Payer: Medicare Other | Source: Ambulatory Visit | Attending: General Surgery | Admitting: General Surgery

## 2017-01-09 DIAGNOSIS — N6089 Other benign mammary dysplasias of unspecified breast: Secondary | ICD-10-CM | POA: Diagnosis present

## 2017-01-09 DIAGNOSIS — N6099 Unspecified benign mammary dysplasia of unspecified breast: Secondary | ICD-10-CM

## 2017-01-09 HISTORY — DX: Malignant neoplasm of unspecified site of unspecified female breast: C50.919

## 2017-01-12 ENCOUNTER — Ambulatory Visit (INDEPENDENT_AMBULATORY_CARE_PROVIDER_SITE_OTHER): Payer: Medicare Other | Admitting: General Surgery

## 2017-01-12 ENCOUNTER — Encounter: Payer: Self-pay | Admitting: General Surgery

## 2017-01-12 VITALS — BP 116/78 | HR 78 | Resp 14 | Ht 62.0 in | Wt 152.6 lb

## 2017-01-12 DIAGNOSIS — N6099 Unspecified benign mammary dysplasia of unspecified breast: Secondary | ICD-10-CM | POA: Diagnosis not present

## 2017-01-12 DIAGNOSIS — D242 Benign neoplasm of left breast: Secondary | ICD-10-CM

## 2017-01-12 MED ORDER — INCOBOTULINUMTOXINA 50 UNITS IM SOLR: 50.0000 [IU] | INTRAMUSCULAR | Status: DC

## 2017-01-12 NOTE — Progress Notes (Signed)
Patient ID: Patricia Knox, female   DOB: [birth year], 81 y.o.   MRN: [id number]  Chief Complaint  Patient presents with  . Follow-up    mammogram    HPI Patricia Knox is a 81 y.o. female who presents for a breast evaluation. The most recent mammogram was done on 01/09/17. Patient does perform regular self breast checks and gets regular mammograms done. She has no current breast concerns.    HPI  Past Medical History:  Diagnosis Date  . 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 GERD (gastroesophageal reflux disease)   . Heart murmur   . Hemifacial spasm    Left   . History of pulmonary embolus (PE)   . Hyperlipidemia   . Hypertension   . Restless leg syndrome     Past Surgical History:  Procedure Laterality Date  . BREAST BIOPSY Left 07/08/2013   NEG - US biopsy  . BREAST BIOPSY Left 01/30/2016   Stereo - Positive  . BREAST LUMPECTOMY Left 03/14/2016   Procedure: BREAST LUMPECTOMY;  Surgeon: [name], MD;  Location: [facility] ORS;  Service: General;  Laterality: Left;  . 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  . COLONOSCOPY  2002  . TOE SURGERY  1999    Family History  Problem Relation Age of Onset  . Stroke Mother   . Heart attack Father   . Breast cancer Neg Hx     Social History Social History  Substance Use Topics  . Smoking status: Never Smoker  . Smokeless tobacco: Never Used  . Alcohol use No    No Known Allergies  Current Outpatient Prescriptions  Medication Sig Dispense Refill  . alendronate (FOSAMAX) 70 MG tablet Take 70 mg by mouth every 7 (seven) days.     [name] aspirin 325 MG tablet Take 325 mg by mouth daily.    . Calcium Carbonate-Vitamin D (CALTRATE 600+D PO) Take 1 capsule by mouth 2 (two) times daily.    [name] gabapentin (NEURONTIN) 100 MG capsule Take 3 capsules (300 mg total) by mouth at bedtime. 90 capsule 11  .  incobotulinumtoxinA (XEOMIN) 50 units SOLR injection Inject 50 Units into the muscle every 3 (three) months.    [name] lisinopril-hydrochlorothiazide (PRINZIDE,ZESTORETIC) 20-12.5 MG per tablet Take 1 tablet by mouth daily.     [name] omeprazole (PRILOSEC) 20 MG capsule Take 20 mg by mouth every morning.     [name] rOPINIRole (REQUIP) 0.5 MG tablet Take 0.5 mg by mouth 2 (two) times daily.     . simvastatin (ZOCOR) 80 MG tablet Take 80 mg by mouth daily at 6 PM.     . traZODone (DESYREL) 100 MG tablet Take 100 mg by mouth at bedtime.      Current Facility-Administered Medications  Medication Dose Route Frequency Provider Last Rate Last Dose  . incobotulinumtoxinA (XEOMIN) 50 units injection 50 Units  50 Units Intramuscular Q90 days Marcial Pacas, MD   50 Units at 09/25/16 0831    Review of Systems Review of Systems  Constitutional: Negative.   Respiratory: Negative.   Cardiovascular: Negative.     Blood pressure 116/78, pulse 78, resp. rate 14, height 5\' 2"  (1.575 m), weight 152 lb 9.6 oz (69.2 kg).  Physical Exam Physical Exam  Constitutional: She is oriented to person, place, and time. She appears well-developed and well-nourished.  Eyes: Conjunctivae are normal. No scleral icterus.  Neck: Neck supple.  Cardiovascular: Normal rate, regular rhythm and normal heart sounds.   Pulmonary/Chest: Effort normal and breath sounds normal. Right breast exhibits no inverted nipple, no mass, no nipple discharge, no skin change and no tenderness. Left breast exhibits no inverted nipple, no mass, no nipple discharge, no skin change and no tenderness.    Lymphadenopathy:    She has no cervical adenopathy.  Neurological: She is alert and oriented to person, place, and time.  Skin: Skin is warm and dry.  Psychiatric: She has a normal mood and affect.    Data Reviewed Bilateral diagnostic mammogram dated 01/09/2017 was reviewed and compared to prebiopsy studies. No new findings. BI-RADS-2. Radiology  recommendation of screening mammograms beginning in 2019.  01/30/2016 core biopsy showed evidence of focal atypical lobular hyperplasia.  Excisional biopsy dated 03/14/2016 showed no upstaging or additional areas of Tiger.   Assessment    Benign breast exam.  Stable mammogram.    Plan    The patient should resume annual screening mammograms with her primary physician.  She is welcome to return at any time should the mammographic or physical abnormality be appreciated.     HPI, Physical Exam, Assessment and Plan have been scribed under the direction and in the presence of [name], MD  Concepcion Living, LPN  I have completed the exam and reviewed the above documentation for accuracy and completeness.  I agree with the above.  Haematologist has been used and any errors in dictation or transcription are unintentional.  Hervey Ard, M.D., F.A.C.S.  [name] 01/12/2017, 3:41 PM

## 2017-01-12 NOTE — Patient Instructions (Signed)
Continue self breast exams. Call office for any new breast issues or concerns. 

## 2017-04-15 ENCOUNTER — Encounter: Payer: Self-pay | Admitting: Neurology

## 2017-04-15 ENCOUNTER — Ambulatory Visit: Payer: Medicare Other | Admitting: Neurology

## 2017-04-15 VITALS — BP 124/77 | HR 68 | Ht 62.0 in | Wt 155.0 lb

## 2017-04-15 DIAGNOSIS — G5132 Clonic hemifacial spasm, left: Secondary | ICD-10-CM

## 2017-04-15 DIAGNOSIS — G5139 Clonic hemifacial spasm, unspecified: Secondary | ICD-10-CM

## 2017-04-15 MED ORDER — INCOBOTULINUMTOXINA 50 UNITS IM SOLR
50.0000 [IU] | INTRAMUSCULAR | Status: DC
  Administered 2017-04-15: 50 [IU] via INTRAMUSCULAR

## 2017-04-15 NOTE — Progress Notes (Signed)
PATIENT: Patricia Knox DOB: Aug 20, 1935  Chief Complaint  Patient presents with  . Hemifacial Spasms    Xeomin 50 units x 1 vial - office supply     HISTORICAL  Patricia Knox is a 81 year old right-handed female, seen in refer by her primary care doctor  [name] for continued EMG guided injection for her left hemifacial spasm.  She had a gradual onset left hemifacial spasm for many years,  She began to receive EMG guided incobotulism toxin a by Dr. Sherley Bounds at [facility] works very well for her, last injection was in July 2017,used total 27.5 units, but because of the insurance reason she has to switch to different practice,she hopes to continue injection through our office.  She had a history of restless leg syndrome, complains of frequent bilateral calf muscle cramping, increased recently, woke her up few times each night, despite taking Requip 0.5 milligrams twice a day,  UPDATE Dec 27th 2017: She came in for EMG guided xeomin injection for left hemifacial spasm today, potential side effect pain  UPDATE September 25 2016: She had suboptimal response to previous injection, noticed left cheek area twitching, no significant side effect noticed,  Gabapentin 100 mg 3 tablets every night has worked well for her nighttime leg cramping.  UPDATE January 07 2017:  she responded very well to previous injection, no significant side effect noted  UPDATE Apr 15 2017:  She responded very well to previous injection, noticed recurrent left eye twitching, left cheek muscle twitching now  REVIEW OF SYSTEMS: Full 14 system review of systems performed and notable only for restless leg, cramps,  ALLERGIES: No Known Allergies  HOME MEDICATIONS: Current Outpatient Medications  Medication Sig Dispense Refill  . alendronate (FOSAMAX) 70 MG tablet Take 70 mg by mouth every 7 (seven) days.     [name] aspirin 325 MG tablet Take 325 mg by mouth daily.    . Calcium Carbonate-Vitamin D  (CALTRATE 600+D PO) Take 1 capsule by mouth 2 (two) times daily.    [name] gabapentin (NEURONTIN) 100 MG capsule Take 3 capsules (300 mg total) by mouth at bedtime. 90 capsule 11  . incobotulinumtoxinA (XEOMIN) 50 units SOLR injection Inject 50 Units into the muscle every 3 (three) months.    [name] lisinopril-hydrochlorothiazide (PRINZIDE,ZESTORETIC) 20-12.5 MG per tablet Take 1 tablet by mouth daily.     [name] omeprazole (PRILOSEC) 20 MG capsule Take 20 mg by mouth every morning.     [name] rOPINIRole (REQUIP) 0.5 MG tablet Take 0.5 mg by mouth 2 (two) times daily.     . simvastatin (ZOCOR) 80 MG tablet Take 80 mg by mouth daily at 6 PM.     . traZODone (DESYREL) 100 MG tablet Take 100 mg by mouth at bedtime.      No current facility-administered medications for this visit.     PAST MEDICAL HISTORY: Past Medical History:  Diagnosis Date  . 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 GERD (gastroesophageal reflux disease)   . Heart murmur   . Hemifacial spasm    Left   . History of pulmonary embolus (PE)   . Hyperlipidemia   . Hypertension   . Restless leg syndrome     PAST SURGICAL HISTORY: Past Surgical History:  Procedure Laterality Date  . BREAST BIOPSY Left 07/08/2013   NEG - US biopsy  . BREAST BIOPSY Left 01/30/2016   Stereo - Positive  . BREAST SURGERY  Left January 2015   Pathology as noted above 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  . COLONOSCOPY  2002  . TOE SURGERY  1999    FAMILY HISTORY: Family History  Problem Relation Age of Onset  . Stroke Mother   . Heart attack Father   . Breast cancer Neg Hx     SOCIAL HISTORY:  Social History   Socioeconomic History  . Marital status: Single    Spouse name: Not on file  . Number of children: 3  . Years of education: HS  . Highest education level: Not on file  Social Needs  . Financial resource strain: Not on file  . Food insecurity - worry: Not on file  .  Food insecurity - inability: Not on file  . Transportation needs - medical: Not on file  . Transportation needs - non-medical: Not on file  Occupational History  . Occupation: Retired  Tobacco Use  . Smoking status: Never Smoker  . Smokeless tobacco: Never Used  Substance and Sexual Activity  . Alcohol use: No  . Drug use: No  . Sexual activity: Not on file  Other Topics Concern  . Not on file  Social History Narrative   Lives at home with husband.   Right-handed.   No daily caffeine use.     PHYSICAL EXAM   Vitals:   04/15/17 1350  BP: 124/77  Pulse: 68  Weight: 155 lb (70.3 kg)  Height: 5\' 2"  (1.575 m)    Not recorded      Body mass index is 28.35 kg/m.  PHYSICAL EXAMNIATION:  Gen: NAD, conversant, well nourised, obese, well groomed                     Cardiovascular: Regular rate rhythm, no peripheral edema, warm, nontender. Eyes: Conjunctivae clear without exudates or hemorrhage Neck: Supple, no carotid bruits. Pulmonary: Clear to auscultation bilaterally   NEUROLOGICAL EXAM:  MENTAL STATUS: Speech:    Speech is normal; fluent and spontaneous with normal comprehension.  Cognition:     Orientation to time, place and person     Normal recent and remote memory     Normal Attention span and concentration     Normal Language, naming, repeating,spontaneous speech     Fund of knowledge   CRANIAL NERVES: CN II: Visual fields are full to confrontation. Fundoscopic exam is normal with sharp discs and no vascular changes. Pupils are round equal and briskly reactive to light. CN III, IV, VI: extraocular movement are normal. No ptosis. CN V: Facial sensation is intact to pinprick in all 3 divisions bilaterally. Corneal responses are intact.  CN VII: she has occasional left hemifacial spasm, involving left orbicularis oculi, occasionally left cheek, and the orbicularis auris muscles. Mild asymmetry of left face, CN VIII: Hearing is normal to rubbing fingers CN IX,  X: Palate elevates symmetrically. Phonation is normal. CN XI: Head turning and shoulder shrug are intact CN XII: Tongue is midline with normal movements and no atrophy.  MOTOR: There is no pronator drift of out-stretched arms. Muscle bulk and tone are normal. Muscle strength is normal.  REFLEXES: Reflexes are 2+ and symmetric at the biceps, triceps, knees, and ankles. Plantar responses are flexor.  SENSORY: Intact to light touch, pinprick, positional sensation and vibratory sensation are intact in fingers and toes.  COORDINATION: Rapid alternating movements and fine finger movements are intact. There is no dysmetria on finger-to-nose and heel-knee-shin.    GAIT/STANCE: Posture is normal.  Gait is steady with normal steps, base, arm swing, and turning. Heel and toe walking are normal. Tandem gait is normal.  Romberg is absent.   DIAGNOSTIC DATA (LABS, IMAGING, TESTING) - I reviewed patient records, labs, notes, testing and imaging myself where available.   ASSESSMENT AND PLAN  Patricia Knox is a 81 y.o. female   Left hemifacial spasm Xeomin 50 units, dissolved into 1 cc of NS. Used 42.5 units, discard 7.5 units  Left orbicularis oculi at 2, 3, 4, 5, 6, 8:00 (2.5 unitsx6=15 units) total Right orbicularis oculi at 8, 9 (2.5unitsx2=5 Units)  Left corrugate 2.5  Right corrugate 2.5 Processors 5 units  Left frontalis 5  units Left platysmas 10 units  Left zygomatic major 5 units   She will return to clinic in 3 months for repeat injection   Marcial Pacas, M.D. Ph.D.  [facility] [address], [place], [place] [address] Ph: [phone] Fax: [phone]  CC: [name], MD

## 2017-04-15 NOTE — Progress Notes (Signed)
**  Xeomin 50 units x 1 vial, NDC 0259-1605-01, Lot [id number], Exp 11/2018, office supply.//mck,rn**

## 2017-04-19 ENCOUNTER — Other Ambulatory Visit: Payer: Self-pay | Admitting: Neurology

## 2017-07-20 ENCOUNTER — Encounter: Payer: Self-pay | Admitting: Neurology

## 2017-07-20 ENCOUNTER — Ambulatory Visit: Payer: Medicare Other | Admitting: Neurology

## 2017-07-20 ENCOUNTER — Encounter (INDEPENDENT_AMBULATORY_CARE_PROVIDER_SITE_OTHER): Payer: Self-pay

## 2017-07-20 VITALS — BP 118/76 | HR 61 | Ht 62.0 in | Wt 151.2 lb

## 2017-07-20 DIAGNOSIS — G5133 Clonic hemifacial spasm, bilateral: Secondary | ICD-10-CM

## 2017-07-20 DIAGNOSIS — G5139 Clonic hemifacial spasm, unspecified: Secondary | ICD-10-CM | POA: Diagnosis not present

## 2017-07-20 MED ORDER — INCOBOTULINUMTOXINA 50 UNITS IM SOLR
50.0000 [IU] | INTRAMUSCULAR | Status: DC
  Administered 2017-07-20: 50 [IU] via INTRAMUSCULAR

## 2017-07-20 NOTE — Progress Notes (Signed)
PATIENT: Patricia Knox DOB: 12/21/1935  Chief Complaint  Patient presents with  . Hemifacial Spasms    Xeomin 50 units x 1 vial - office supply     HISTORICAL  Patricia Knox is a 82 year old right-handed female, seen in refer by her primary care doctor  Patricia Knox for continued EMG guided injection for her left hemifacial spasm.  She had a gradual onset left hemifacial spasm for many years,  She began to receive EMG guided incobotulism toxin a by Dr. Sherley Bounds at [facility] works very well for her, last injection was in July 2017,used total 27.5 units, but because of the insurance reason she has to switch to different practice,she hopes to continue injection through our office.  She had a history of restless leg syndrome, complains of frequent bilateral calf muscle cramping, increased recently, woke her up few times each night, despite taking Requip 0.5 milligrams twice a day,  UPDATE Dec 27th 2017: She came in for EMG guided xeomin injection for left hemifacial spasm today, potential side effect pain  UPDATE September 25 2016: She had suboptimal response to previous injection, noticed left cheek area twitching, no significant side effect noticed,  Gabapentin 100 mg 3 tablets every night has worked well for her nighttime leg cramping.  UPDATE January 07 2017:  she responded very well to previous injection, no significant side effect noted  UPDATE Apr 15 2017:  She responded very well to previous injection, noticed recurrent left eye twitching, left cheek muscle twitching now  Update July 20, 2017: She responded well to previous injection, there was no significant side effect noticed, only recent few weeks she noticed recurrent frequent left eye twitching, involving left cheek as well,  REVIEW OF SYSTEMS: Full 14 system review of systems performed and notable only for restless leg, cramps,  ALLERGIES: No Known Allergies  HOME MEDICATIONS: Current  Outpatient Medications  Medication Sig Dispense Refill  . alendronate (FOSAMAX) 70 MG tablet Take 70 mg by mouth every 7 (seven) days.     [name] aspirin 325 MG tablet Take 325 mg by mouth daily.    . Calcium Carbonate-Vitamin D (CALTRATE 600+D PO) Take 1 capsule by mouth 2 (two) times daily.    [name] gabapentin (NEURONTIN) 100 MG capsule Take 3 capsules (300 mg total) by mouth at bedtime. 90 capsule 11  . incobotulinumtoxinA (XEOMIN) 50 units SOLR injection Inject 50 Units into the muscle every 3 (three) months.    [name] lisinopril-hydrochlorothiazide (PRINZIDE,ZESTORETIC) 20-12.5 MG per tablet Take 1 tablet by mouth daily.     [name] omeprazole (PRILOSEC) 20 MG capsule Take 20 mg by mouth every morning.     [name] rOPINIRole (REQUIP) 0.5 MG tablet Take 0.5 mg by mouth 2 (two) times daily.     . simvastatin (ZOCOR) 80 MG tablet Take 80 mg by mouth daily at 6 PM.     . traZODone (DESYREL) 100 MG tablet Take 100 mg by mouth at bedtime.      No current facility-administered medications for this visit.     PAST MEDICAL HISTORY: Past Medical History:  Diagnosis Date  . 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 GERD (gastroesophageal reflux disease)   . Heart murmur   . Hemifacial spasm    Left   . History of pulmonary embolus (PE)   . Hyperlipidemia   . Hypertension   . Restless leg syndrome     PAST SURGICAL HISTORY:  Past Surgical History:  Procedure Laterality Date  . BREAST BIOPSY Left 07/08/2013   NEG - US biopsy  . BREAST BIOPSY Left 01/30/2016   Stereo - Positive  . 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  . COLONOSCOPY  2002  . TOE SURGERY  1999    FAMILY HISTORY: Family History  Problem Relation Age of Onset  . Stroke Mother   . Heart attack Father   . Breast cancer Neg Hx     SOCIAL HISTORY:  Social History   Socioeconomic History  . Marital status:  Single    Spouse name: Not on file  . Number of children: 3  . Years of education: HS  . Highest education level: Not on file  Social Needs  . Financial resource strain: Not on file  . Food insecurity - worry: Not on file  . Food insecurity - inability: Not on file  . Transportation needs - medical: Not on file  . Transportation needs - non-medical: Not on file  Occupational History  . Occupation: Retired  Tobacco Use  . Smoking status: Never Smoker  . Smokeless tobacco: Never Used  Substance and Sexual Activity  . Alcohol use: No  . Drug use: No  . Sexual activity: Not on file  Other Topics Concern  . Not on file  Social History Narrative   Lives at home with husband.   Right-handed.   No daily caffeine use.     PHYSICAL EXAM   Vitals:   04/15/17 1350  BP: 124/77  Pulse: 68  Weight: 155 lb (70.3 kg)  Height: 5\' 2"  (1.575 m)    Not recorded      Body mass index is 28.35 kg/m.  PHYSICAL EXAMNIATION:  Gen: NAD, conversant, well nourised, obese, well groomed                     Cardiovascular: Regular rate rhythm, no peripheral edema, warm, nontender. Eyes: Conjunctivae clear without exudates or hemorrhage Neck: Supple, no carotid bruits. Pulmonary: Clear to auscultation bilaterally   NEUROLOGICAL EXAM:  MENTAL STATUS: Speech:    Speech is normal; fluent and spontaneous with normal comprehension.  Cognition:     Orientation to time, place and person     Normal recent and remote memory     Normal Attention span and concentration     Normal Language, naming, repeating,spontaneous speech     Fund of knowledge   CRANIAL NERVES: CN II: Visual fields are full to confrontation. Fundoscopic exam is normal with sharp discs and no vascular changes. Pupils are round equal and briskly reactive to light. CN III, IV, VI: extraocular movement are normal. No ptosis. CN V: Facial sensation is intact to pinprick in all 3 divisions bilaterally. Corneal responses are  intact.  CN VII: she has occasional left hemifacial spasm, involving left orbicularis oculi, occasionally left cheek, and the orbicularis auris muscles. Mild asymmetry of left face, CN VIII: Hearing is normal to rubbing fingers CN IX, X: Palate elevates symmetrically. Phonation is normal. CN XI: Head turning and shoulder shrug are intact CN XII: Tongue is midline with normal movements and no atrophy.  MOTOR: There is no pronator drift of out-stretched arms. Muscle bulk and tone are normal. Muscle strength is normal.  REFLEXES: Reflexes are 2+ and symmetric at the biceps, triceps, knees, and ankles. Plantar responses are flexor.  SENSORY: Intact to light touch, pinprick, positional sensation and  vibratory sensation are intact in fingers and toes.  COORDINATION: Rapid alternating movements and fine finger movements are intact. There is no dysmetria on finger-to-nose and heel-knee-shin.    GAIT/STANCE: Posture is normal. Gait is steady with normal steps, base, arm swing, and turning. Heel and toe walking are normal. Tandem gait is normal.  Romberg is absent.   DIAGNOSTIC DATA (LABS, IMAGING, TESTING) - I reviewed patient records, labs, notes, testing and imaging myself where available.   ASSESSMENT AND PLAN  Patricia Knox is a 82 y.o. female   Left hemifacial spasm Xeomin 50 units, dissolved into 1 cc of NS. Used 50 units  Left orbicularis oculi at 2, 3, 4, 5, 6, 8:00 (2.5 unitsx6=15 units) total Right orbicularis oculi at 8, 9 (2.5unitsx2=5 Units)    Right corrugate 5 Left corrugate 5 Processors 5 units  Left depressor anguli oris 5 units Left platysmas 10 units    She will return to clinic in 3 months for repeat injection   Marcial Pacas, M.D. Ph.D.  [facility] [address], [place], [place] [address] Ph: [phone] Fax: [phone]  CC: Patricia Maltese, MD

## 2017-07-20 NOTE — Progress Notes (Signed)
**  Xeomin 50 units x 1 vial, NDC 0259-1605-01, Lot [id number], Exp 11/2018, office supply.//mck,rn**

## 2017-10-20 ENCOUNTER — Ambulatory Visit (INDEPENDENT_AMBULATORY_CARE_PROVIDER_SITE_OTHER): Payer: Medicare Other | Admitting: Neurology

## 2017-10-20 ENCOUNTER — Encounter: Payer: Self-pay | Admitting: Neurology

## 2017-10-20 VITALS — BP 118/62 | HR 78 | Ht 62.0 in | Wt 153.8 lb

## 2017-10-20 DIAGNOSIS — G5133 Clonic hemifacial spasm, bilateral: Secondary | ICD-10-CM | POA: Diagnosis not present

## 2017-10-20 DIAGNOSIS — G5139 Clonic hemifacial spasm, unspecified: Secondary | ICD-10-CM | POA: Diagnosis not present

## 2017-10-20 MED ORDER — INCOBOTULINUMTOXINA 50 UNITS IM SOLR
50.0000 [IU] | INTRAMUSCULAR | Status: DC
  Administered 2017-10-20: 50 [IU] via INTRAMUSCULAR

## 2017-10-20 NOTE — Progress Notes (Signed)
PATIENT: Patricia Knox DOB: December 10, 1935  Chief Complaint  Patient presents with  . Hemifacial Spasms    Xeomin 50 units x 1 vial - office supply     HISTORICAL  Patricia Knox is a 82 year old right-handed female, seen in refer by her primary care doctor  [name] for continued EMG guided injection for her left hemifacial spasm.  She had a gradual onset left hemifacial spasm for many years,  She began to receive EMG guided incobotulism toxin a by Dr. Sherley Bounds at [facility] works very well for her, last injection was in July 2017,used total 27.5 units, but because of the insurance reason she has to switch to different practice,she hopes to continue injection through our office.  She had a history of restless leg syndrome, complains of frequent bilateral calf muscle cramping, increased recently, woke her up few times each night, despite taking Requip 0.5 milligrams twice a day,  UPDATE Dec 27th 2017: She came in for EMG guided xeomin injection for left hemifacial spasm today, potential side effect pain  UPDATE September 25 2016: She had suboptimal response to previous injection, noticed left cheek area twitching, no significant side effect noticed,  Gabapentin 100 mg 3 tablets every night has worked well for her nighttime leg cramping.  UPDATE January 07 2017:  she responded very well to previous injection, no significant side effect noted  UPDATE Apr 15 2017:  She responded very well to previous injection, noticed recurrent left eye twitching, left cheek muscle twitching now  Update July 20, 2017: She responded well to previous injection, there was no significant side effect noticed, only recent few weeks she noticed recurrent frequent left eye twitching, involving left cheek as well,  UPDATE Oct 20 2017: She responded very well to previous injection  REVIEW OF SYSTEMS: Full 14 system review of systems performed and notable only for restless leg,  cramps,  ALLERGIES: No Known Allergies  HOME MEDICATIONS: Current Outpatient Medications  Medication Sig Dispense Refill  . alendronate (FOSAMAX) 70 MG tablet Take 70 mg by mouth every 7 (seven) days.     [name] aspirin 325 MG tablet Take 325 mg by mouth daily.    . Calcium Carbonate-Vitamin D (CALTRATE 600+D PO) Take 1 capsule by mouth 2 (two) times daily.    [name] gabapentin (NEURONTIN) 100 MG capsule Take 3 capsules (300 mg total) by mouth at bedtime. 90 capsule 11  . incobotulinumtoxinA (XEOMIN) 50 units SOLR injection Inject 50 Units into the muscle every 3 (three) months.    [name] lisinopril-hydrochlorothiazide (PRINZIDE,ZESTORETIC) 20-12.5 MG per tablet Take 1 tablet by mouth daily.     [name] omeprazole (PRILOSEC) 20 MG capsule Take 20 mg by mouth every morning.     [name] rOPINIRole (REQUIP) 0.5 MG tablet Take 0.5 mg by mouth 2 (two) times daily.     . simvastatin (ZOCOR) 80 MG tablet Take 80 mg by mouth daily at 6 PM.     . traZODone (DESYREL) 100 MG tablet Take 100 mg by mouth at bedtime.      No current facility-administered medications for this visit.     PAST MEDICAL HISTORY: Past Medical History:  Diagnosis Date  . 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 GERD (gastroesophageal reflux disease)   . Heart murmur   . Hemifacial spasm    Left   . History of pulmonary embolus (PE)   . Hyperlipidemia   . Hypertension   .  Restless leg syndrome     PAST SURGICAL HISTORY: Past Surgical History:  Procedure Laterality Date  . BREAST BIOPSY Left 07/08/2013   NEG - US biopsy  . BREAST BIOPSY Left 01/30/2016   Stereo - Positive  . 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  . COLONOSCOPY  2002  . TOE SURGERY  1999    FAMILY HISTORY: Family History  Problem Relation Age of Onset  . Stroke Mother   . Heart attack Father   . Breast cancer Neg Hx     SOCIAL  HISTORY:  Social History   Socioeconomic History  . Marital status: Single    Spouse name: Not on file  . Number of children: 3  . Years of education: HS  . Highest education level: Not on file  Social Needs  . Financial resource strain: Not on file  . Food insecurity - worry: Not on file  . Food insecurity - inability: Not on file  . Transportation needs - medical: Not on file  . Transportation needs - non-medical: Not on file  Occupational History  . Occupation: Retired  Tobacco Use  . Smoking status: Never Smoker  . Smokeless tobacco: Never Used  Substance and Sexual Activity  . Alcohol use: No  . Drug use: No  . Sexual activity: Not on file  Other Topics Concern  . Not on file  Social History Narrative   Lives at home with husband.   Right-handed.   No daily caffeine use.     PHYSICAL EXAM   Vitals:   04/15/17 1350  BP: 124/77  Pulse: 68  Weight: 155 lb (70.3 kg)  Height: 5\' 2"  (1.575 m)    Not recorded      Body mass index is 28.35 kg/m.  PHYSICAL EXAMNIATION:  Gen: NAD, conversant, well nourised, obese, well groomed                     Cardiovascular: Regular rate rhythm, no peripheral edema, warm, nontender. Eyes: Conjunctivae clear without exudates or hemorrhage Neck: Supple, no carotid bruits. Pulmonary: Clear to auscultation bilaterally   NEUROLOGICAL EXAM:  MENTAL STATUS: Speech:    Speech is normal; fluent and spontaneous with normal comprehension.  Cognition:     Orientation to time, place and person     Normal recent and remote memory     Normal Attention span and concentration     Normal Language, naming, repeating,spontaneous speech     Fund of knowledge   CRANIAL NERVES: CN II: Visual fields are full to confrontation. Fundoscopic exam is normal with sharp discs and no vascular changes. Pupils are round equal and briskly reactive to light. CN III, IV, VI: extraocular movement are normal. No ptosis. CN V: Facial sensation is  intact to pinprick in all 3 divisions bilaterally. Corneal responses are intact.  CN VII: she has occasional left hemifacial spasm, involving left orbicularis oculi, occasionally left cheek, and the orbicularis auris muscles. Mild asymmetry of left face, CN VIII: Hearing is normal to rubbing fingers CN IX, X: Palate elevates symmetrically. Phonation is normal. CN XI: Head turning and shoulder shrug are intact CN XII: Tongue is midline with normal movements and no atrophy.  MOTOR: There is no pronator drift of out-stretched arms. Muscle bulk and tone are normal. Muscle strength is normal.  REFLEXES: Reflexes are 2+ and symmetric at the biceps, triceps, knees, and ankles. Plantar responses are flexor.  SENSORY: Intact to light touch, pinprick, positional sensation and vibratory sensation are intact in fingers and toes.  COORDINATION: Rapid alternating movements and fine finger movements are intact. There is no dysmetria on finger-to-nose and heel-knee-shin.    GAIT/STANCE: Posture is normal. Gait is steady with normal steps, base, arm swing, and turning. Heel and toe walking are normal. Tandem gait is normal.  Romberg is absent.   DIAGNOSTIC DATA (LABS, IMAGING, TESTING) - I reviewed patient records, labs, notes, testing and imaging myself where available.   ASSESSMENT AND PLAN  Patricia Knox is a 82 y.o. female   Left hemifacial spasm Xeomin 50 units, dissolved into 1 cc of NS. Used 50 units  Left orbicularis oculi at 2, 3, 4, 5, 6, 8:00 (2.5 unitsx6=15 units) total Right orbicularis oculi at 8, 9, 5, 6 (2.5unitsx4=10 Units)  Right frontalis  5 units Left frontalis 5 units   Left platysmas 10 units Left zygomatic major 5 units  She will return to clinic in 3 months for repeat injection   Marcial Pacas, M.D. Ph.D.  [facility] [address], [place], [place] [address] Ph: [phone] Fax: [phone]  CC: [name], MD

## 2017-10-20 NOTE — Progress Notes (Signed)
**  Xeomin 50 units x 1 vials, NDC 0259-1605-01, Lot [id number], Exp 05/2019, office supply.//mck,rn** 

## 2017-11-27 ENCOUNTER — Other Ambulatory Visit: Payer: Self-pay | Admitting: Internal Medicine

## 2017-11-27 DIAGNOSIS — Z1231 Encounter for screening mammogram for malignant neoplasm of breast: Secondary | ICD-10-CM

## 2017-12-19 IMAGING — MG MM DIGITAL DIAGNOSTIC UNILAT*L* W/ TOMO W/ CAD
8 series · 8 of 16 positions shown · non-contrast
Comparison: Previous exam(s).

CLINICAL DATA: 79-year-old female called back from screening
mammogram for a possible mass in the left breast. The patient had an
ultrasound-guided core biopsy of the [DATE] region of the left breast
on 07/08/2013 which showed a papillary lesion. A coil shaped clip
was deployed at that time. Surgical excision was recommended. The
patient was seen by Dr. Hniang and in his notes he states that
there was a low index of suspicion for malignancy therefore a second
vacuum biopsy for removal of the lesion would be performed. This was
performed in Wednesday July, 2013. Pathology demonstrated an intraductal
papilloma with focal apocrine changes, microcalcifications in
sclerosing adenosis and negative for atypia and malignancy. A wing
shaped clip was deployed during the biopsy.

EXAM:
2D DIGITAL DIAGNOSTIC LEFT MAMMOGRAM WITH ADJUNCT TOMO
ULTRASOUND LEFT BREAST

[L CC (1 of 2)]
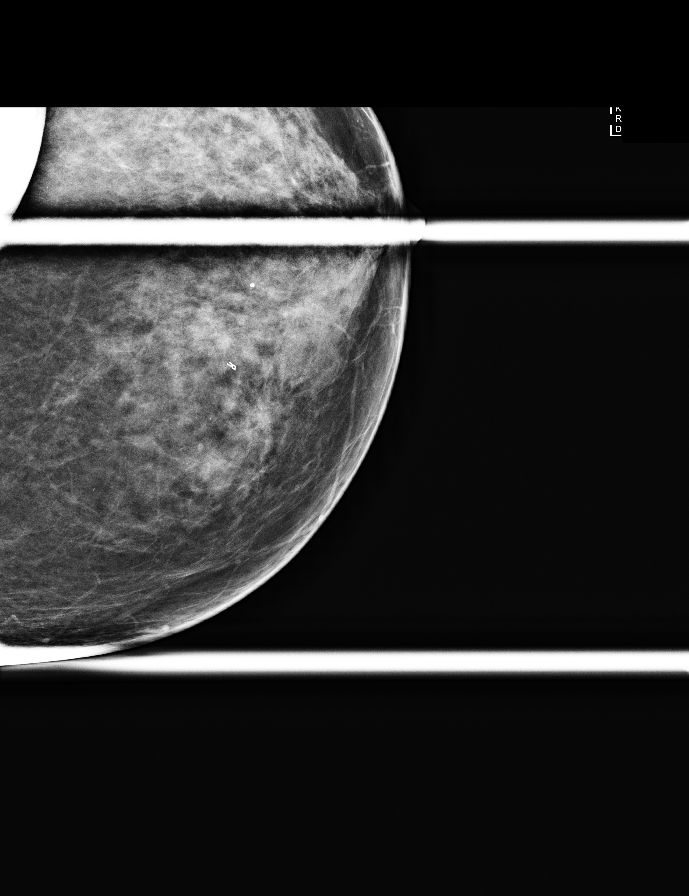

[L MLO (1 of 2)]
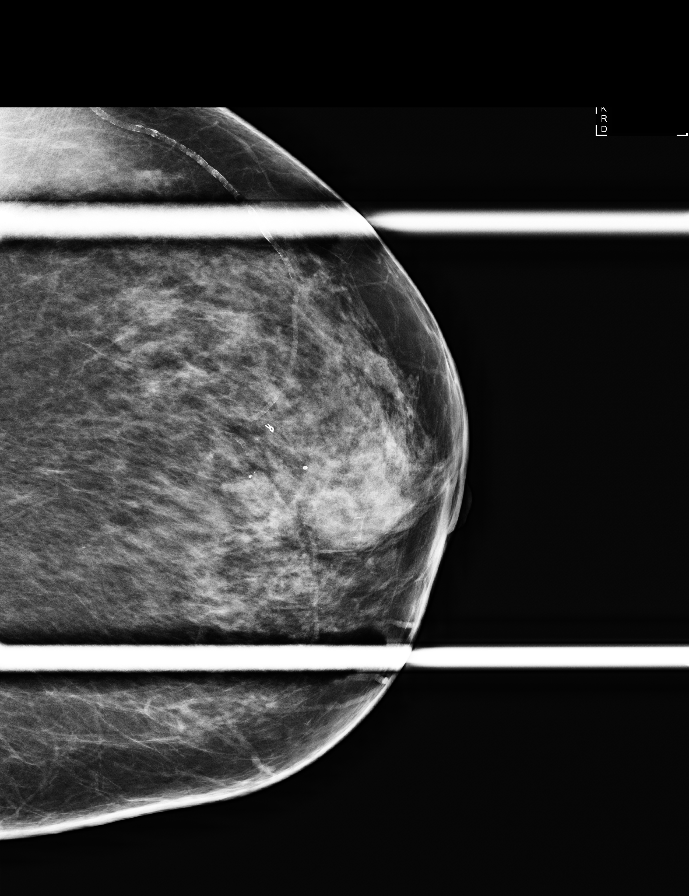

[L CC synth-2D]
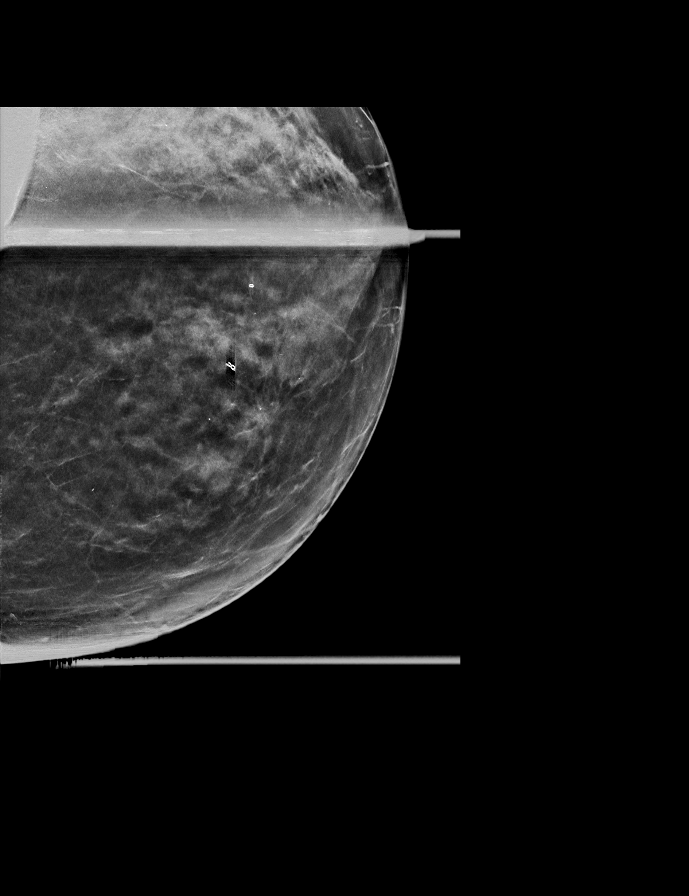

[L MLO synth-2D]
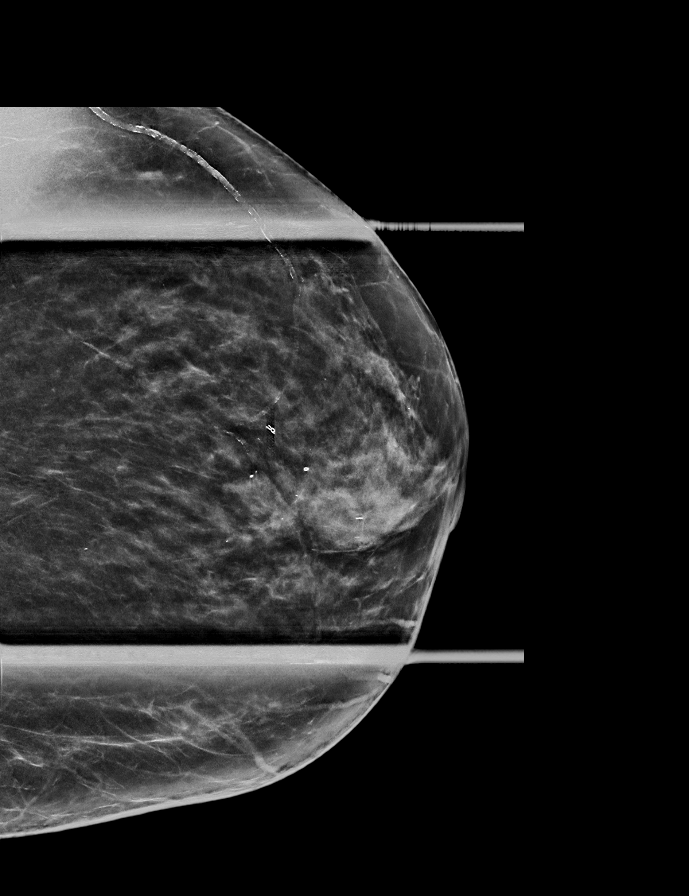

[L MLO tomo · tomo slice 33/64.0]
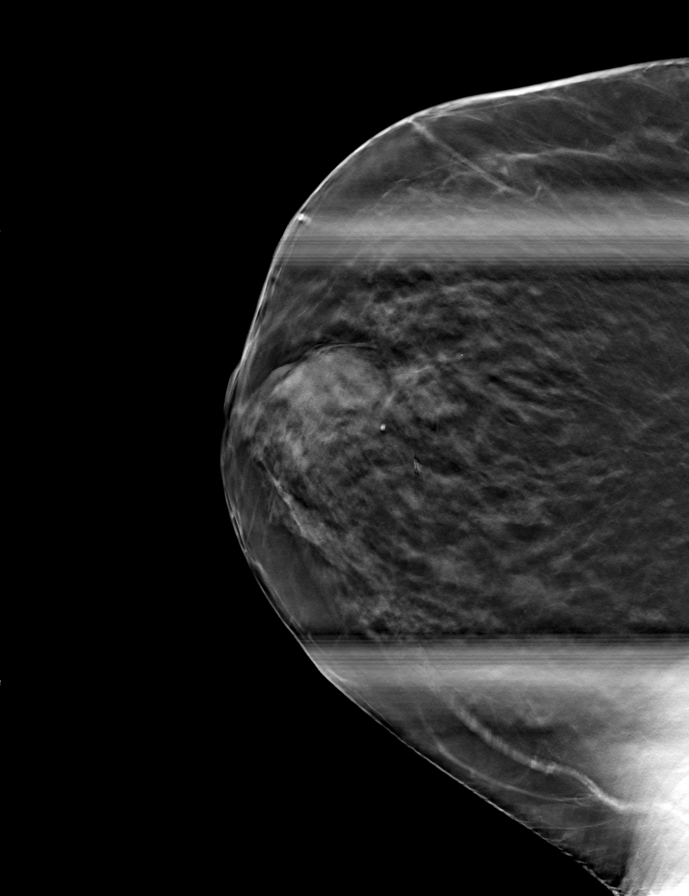

[L CC tomo · tomo slice 29/57.0]
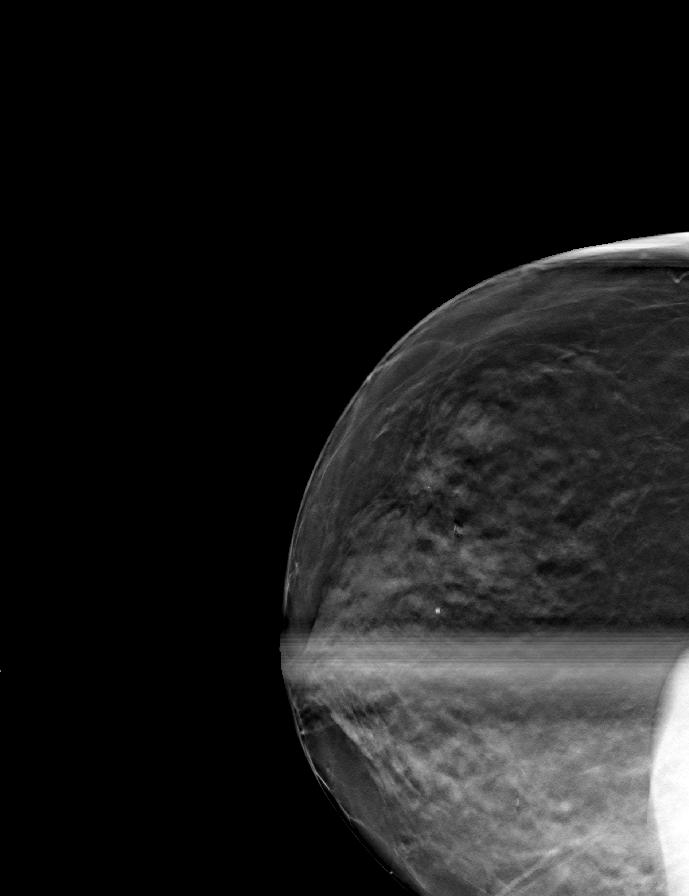

[L MLO (2 of 2)]
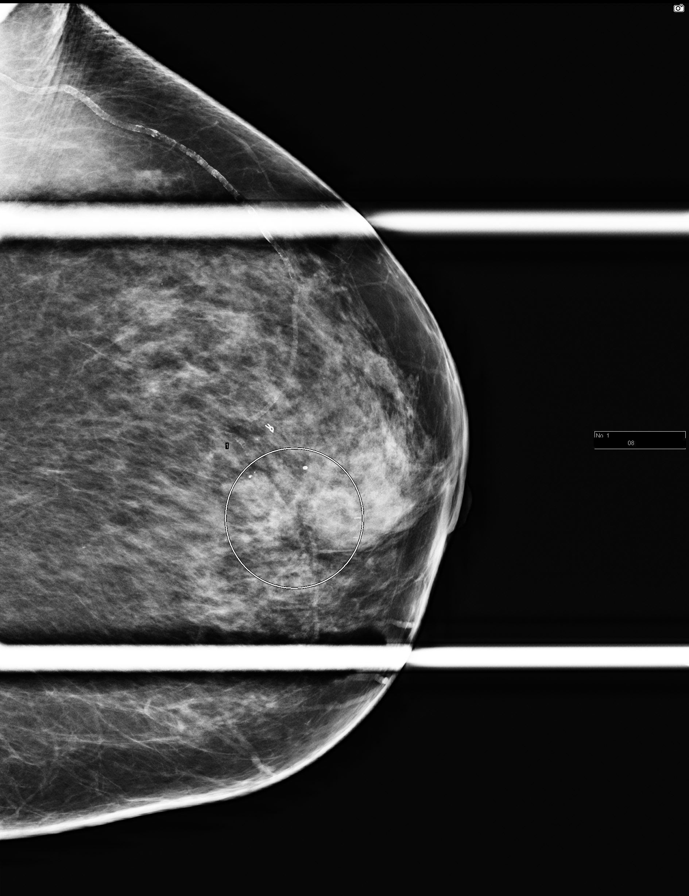

[L CC (2 of 2)]
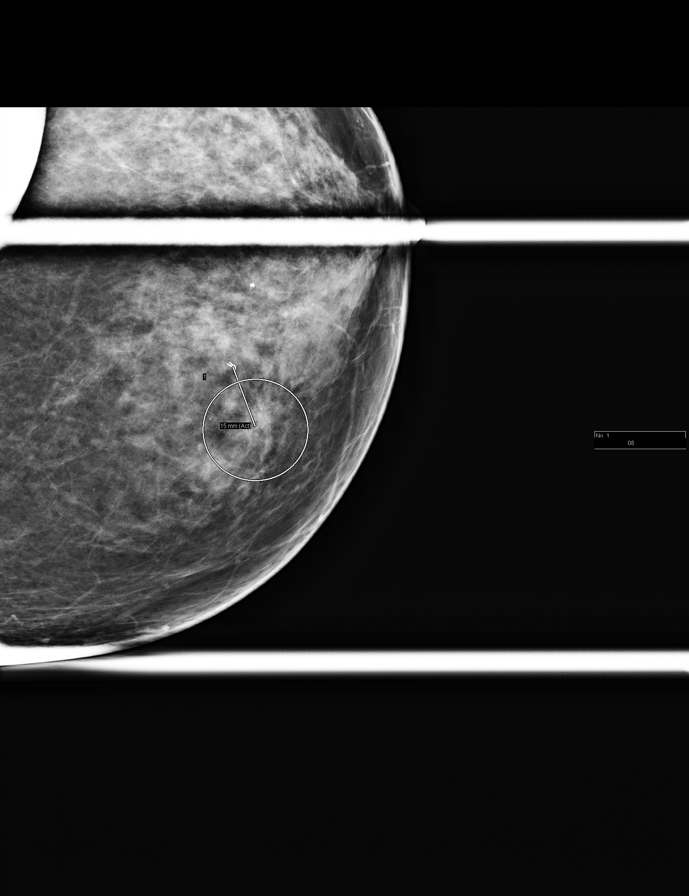

[8 of 16 positions shown; findings below may reference images not displayed]

ACR Breast Density Category c: The breast tissue is heterogeneously
dense, which may obscure small masses.
FINDINGS: Additional imaging of the left breast was performed. There is a a
wing shaped clip in the upper-inner quadrant of the left breast.
Approximately 1.5 cm medial to this is an area of added density and
mild distortion. There are no malignant type microcalcifications.

On physical exam, I do not palpate a mass in the upper-inner
quadrant of the left breast.

Targeted ultrasound is performed, showing no suspicious mass,
distortion or abnormal shadowing in the upper-inner quadrant of the
left breast.
IMPRESSION: Distortion in the [DATE] region of the left breast. This is in the
area where the prior stereotactic biopsies were performed. This may
be secondary to post biopsy changes but malignancy could not be
excluded.

RECOMMENDATION:
Stereotactic biopsy of the left breast is recommended.

I have discussed the findings and recommendations with the patient.
Results were also provided in writing at the conclusion of the
visit. If applicable, a reminder letter will be sent to the patient
regarding the next appointment.

BI-RADS CATEGORY  4: Suspicious.

## 2017-12-28 ENCOUNTER — Encounter: Payer: Self-pay | Admitting: Podiatry

## 2017-12-28 ENCOUNTER — Ambulatory Visit: Payer: Medicare Other | Admitting: Podiatry

## 2017-12-28 ENCOUNTER — Ambulatory Visit (INDEPENDENT_AMBULATORY_CARE_PROVIDER_SITE_OTHER): Payer: Medicare Other

## 2017-12-28 VITALS — BP 122/76 | HR 76 | Resp 16

## 2017-12-28 DIAGNOSIS — M79676 Pain in unspecified toe(s): Secondary | ICD-10-CM

## 2017-12-28 DIAGNOSIS — B351 Tinea unguium: Secondary | ICD-10-CM | POA: Diagnosis not present

## 2017-12-28 DIAGNOSIS — M779 Enthesopathy, unspecified: Secondary | ICD-10-CM

## 2017-12-28 DIAGNOSIS — Q828 Other specified congenital malformations of skin: Secondary | ICD-10-CM | POA: Diagnosis not present

## 2017-12-28 DIAGNOSIS — M778 Other enthesopathies, not elsewhere classified: Secondary | ICD-10-CM

## 2017-12-28 NOTE — Progress Notes (Signed)
Subjective:  Patient ID: Patricia Knox, female    DOB: 10-15-1935,  MRN: [id number] HPI Chief Complaint  Patient presents with  . Foot Pain    Plantar forefoot right and 1st MPJ left - aching x 3-4 months, some callused areas, "knot" on right, no treatment  . New Patient (Initial Visit)    82 y.o. female presents with the above complaint.   ROS: Denies fever chills nausea vomiting muscle aches pains calf pain back pain chest pain shortness of breath.  Past Medical History:  Diagnosis Date  . 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 GERD (gastroesophageal reflux disease)   . Heart murmur   . Hemifacial spasm    Left   . History of pulmonary embolus (PE)   . Hyperlipidemia   . Hypertension   . Restless leg syndrome    Past Surgical History:  Procedure Laterality Date  . BREAST BIOPSY Left 07/08/2013   NEG - US biopsy  . BREAST BIOPSY Left 01/30/2016   Stereo - Positive  . BREAST LUMPECTOMY Left 03/14/2016   Procedure: BREAST LUMPECTOMY;  Surgeon: [name], MD;  Location: [facility] ORS;  Service: General;  Laterality: Left;  . 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  . COLONOSCOPY  2002  . TOE SURGERY  1999    Current Outpatient Medications:  .  alendronate (FOSAMAX) 70 MG tablet, Take 70 mg by mouth every 7 (seven) days. , Disp: , Rfl:  .  amLODipine (NORVASC) 5 MG tablet, Take 5 mg by mouth daily., Disp: , Rfl: 6 .  aspirin 325 MG tablet, Take 325 mg by mouth daily., Disp: , Rfl:  .  atorvastatin (LIPITOR) 40 MG tablet, Take 40 mg by mouth at bedtime., Disp: , Rfl: 0 .  Calcium Carbonate-Vitamin D (CALTRATE 600+D PO), Take 1 capsule by mouth 2 (two) times daily., Disp: , Rfl:  .  gabapentin (NEURONTIN) 100 MG capsule, TAKE THREE CAPSULES BY MOUTH AT BEDTIME, Disp: 90 capsule, Rfl: 11 .  incobotulinumtoxinA (XEOMIN) 50 units SOLR injection,  Inject 50 Units into the muscle every 3 (three) months., Disp: , Rfl:  .  lisinopril-hydrochlorothiazide (PRINZIDE,ZESTORETIC) 10-12.5 MG tablet, Take 1 tablet by mouth daily., Disp: , Rfl: 3 .  lisinopril-hydrochlorothiazide (PRINZIDE,ZESTORETIC) 20-12.5 MG per tablet, Take 1 tablet by mouth daily. , Disp: , Rfl:  .  metroNIDAZOLE (METROGEL) 0.75 % vaginal gel, INSERT 1 APPLICATORFUL VAGINALLY EVERY DAY AT BEDTIME FOR 7 DAYS, Disp: , Rfl: 0 .  omeprazole (PRILOSEC) 20 MG capsule, Take 20 mg by mouth every morning. , Disp: , Rfl:  .  rOPINIRole (REQUIP) 0.5 MG tablet, Take 0.5 mg by mouth 2 (two) times daily. , Disp: , Rfl:  .  simvastatin (ZOCOR) 80 MG tablet, Take 80 mg by mouth daily at 6 PM. , Disp: , Rfl:  .  spironolactone (ALDACTONE) 25 MG tablet, Take 25 mg by mouth daily., Disp: , Rfl: 6 .  traZODone (DESYREL) 100 MG tablet, Take 100 mg by mouth at bedtime. , Disp: , Rfl:   Current Facility-Administered Medications:  .  incobotulinumtoxinA (XEOMIN) 50 units injection 50 Units, 50 Units, Intramuscular, Q90 days, Marcial Pacas, MD, 50 Units at 10/20/17 1537  No Known Allergies Review of Systems Objective:   Vitals:   12/28/17 1640  BP: 122/76  Pulse: 76  Resp: 16    General: Well developed, nourished, in no  acute distress, alert and oriented x3   Dermatological: Skin is warm, dry and supple bilateral. Nails x 10 are well maintained; remaining integument appears unremarkable at this time. There are no open sores, no preulcerative lesions, no rash or signs of infection present.  Vascular: Dorsalis Pedis artery and Posterior Tibial artery pedal pulses are 2/4 bilateral with immedate capillary fill time. Pedal hair growth present. No varicosities and no lower extremity edema present bilateral.   Neruologic: Grossly intact via light touch bilateral. Vibratory intact via tuning fork bilateral. Protective threshold with Semmes Wienstein monofilament intact to all pedal sites bilateral.  Patellar and Achilles deep tendon reflexes 2+ bilateral. No Babinski or clonus noted bilateral.   Musculoskeletal: No gross boney pedal deformities bilateral. No pain, crepitus, or limitation noted with foot and ankle range of motion bilateral. Muscular strength 5/5 in all groups tested bilateral.  Gait: Unassisted, Nonantalgic.    Radiographs:  Radiographs taken demonstrate hallux valgus deformities with plantarflexed second metatarsal right foot.  Assessment & Plan:   Assessment: Ingrown toenail hallux left thick painful mycotic nails with calluses.  Plantarflexed second metatarsal with capsulitis right foot  Plan: Discussed etiology pathology conservative versus surgical therapies at this point debrided all reactive hyperkeratotic tissue debrided toenails 1 through 5 bilateral.  After sterile Betadine skin prep I injected 20 mg Kenalog 5 mg Marcaine point maximal tenderness around the second metatarsophalangeal joint of the right foot.  Debrided all tissues follow-up with her as needed.     Aldona Bryner T. Wheeler, [place]

## 2018-01-12 ENCOUNTER — Encounter: Payer: Self-pay | Admitting: Neurology

## 2018-01-12 ENCOUNTER — Ambulatory Visit: Payer: Medicare Other | Admitting: Neurology

## 2018-01-12 VITALS — BP 131/85 | HR 72 | Ht 62.0 in | Wt 150.8 lb

## 2018-01-12 DIAGNOSIS — G5139 Clonic hemifacial spasm, unspecified: Secondary | ICD-10-CM

## 2018-01-12 DIAGNOSIS — G5133 Clonic hemifacial spasm, bilateral: Secondary | ICD-10-CM | POA: Diagnosis not present

## 2018-01-12 MED ORDER — INCOBOTULINUMTOXINA 50 UNITS IM SOLR
50.0000 [IU] | INTRAMUSCULAR | Status: DC
Start: 2018-01-12 — End: 2018-07-15
  Administered 2018-01-12: 50 [IU] via INTRAMUSCULAR

## 2018-01-12 NOTE — Progress Notes (Signed)
**  Xeomin 50 units x 1 vial, NDC 0259-1605-01, Lot [id number], Exp 05/2019, office supply.//mck,rn**

## 2018-01-12 NOTE — Progress Notes (Signed)
PATIENT: Patricia Knox DOB: [birth year]  Chief Complaint  Patient presents with  . Hemifacial Spasms    Xeomin 50 units x 1 vial - office supply     HISTORICAL  Patricia Knox is a 82 year old right-handed female, seen in refer by her primary care doctor  [name] for continued EMG guided injection for her left hemifacial spasm.  She had a gradual onset left hemifacial spasm for many years,  She began to receive EMG guided incobotulism toxin a by Dr. Sherley Bounds at [facility] works very well for her, last injection was in July 2017,used total 27.5 units, but because of the insurance reason she has to switch to different practice,she hopes to continue injection through our office.  She had a history of restless leg syndrome, complains of frequent bilateral calf muscle cramping, increased recently, woke her up few times each night, despite taking Requip 0.5 milligrams twice a day,  UPDATE Dec 27th 2017: She came in for EMG guided xeomin injection for left hemifacial spasm today, potential side effect pain  UPDATE September 25 2016: She had suboptimal response to previous injection, noticed left cheek area twitching, no significant side effect noticed,  Gabapentin 100 mg 3 tablets every night has worked well for her nighttime leg cramping.  UPDATE January 07 2017:  she responded very well to previous injection, no significant side effect noted  UPDATE Apr 15 2017:  She responded very well to previous injection, noticed recurrent left eye twitching, left cheek muscle twitching now  Update July 20, 2017: She responded well to previous injection, there was no significant side effect noticed, only recent few weeks she noticed recurrent frequent left eye twitching, involving left cheek as well,  UPDATE Oct 20 2017: She responded very well to previous injection  UPDATE January 12 2018: She responded well to previous injection,  REVIEW OF SYSTEMS: Full 14 system  review of systems performed and notable only for restless leg, cramps,  ALLERGIES: No Known Allergies  HOME MEDICATIONS: Current Outpatient Medications  Medication Sig Dispense Refill  . alendronate (FOSAMAX) 70 MG tablet Take 70 mg by mouth every 7 (seven) days.     [name] aspirin 325 MG tablet Take 325 mg by mouth daily.    . Calcium Carbonate-Vitamin D (CALTRATE 600+D PO) Take 1 capsule by mouth 2 (two) times daily.    [name] gabapentin (NEURONTIN) 100 MG capsule Take 3 capsules (300 mg total) by mouth at bedtime. 90 capsule 11  . incobotulinumtoxinA (XEOMIN) 50 units SOLR injection Inject 50 Units into the muscle every 3 (three) months.    [name] lisinopril-hydrochlorothiazide (PRINZIDE,ZESTORETIC) 20-12.5 MG per tablet Take 1 tablet by mouth daily.     [name] omeprazole (PRILOSEC) 20 MG capsule Take 20 mg by mouth every morning.     [name] rOPINIRole (REQUIP) 0.5 MG tablet Take 0.5 mg by mouth 2 (two) times daily.     . simvastatin (ZOCOR) 80 MG tablet Take 80 mg by mouth daily at 6 PM.     . traZODone (DESYREL) 100 MG tablet Take 100 mg by mouth at bedtime.      No current facility-administered medications for this visit.     PAST MEDICAL HISTORY: Past Medical History:  Diagnosis Date  . 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 GERD (gastroesophageal reflux disease)   . Heart murmur   . Hemifacial spasm    Left   . History of pulmonary  embolus (PE)   . Hyperlipidemia   . Hypertension   . Restless leg syndrome     PAST SURGICAL HISTORY: Past Surgical History:  Procedure Laterality Date  . BREAST BIOPSY Left 07/08/2013   NEG - US biopsy  . BREAST BIOPSY Left 01/30/2016   Stereo - Positive  . 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  . COLONOSCOPY  2002  . TOE SURGERY  1999    FAMILY HISTORY: Family History  Problem Relation Age of Onset  . Stroke Mother   .  Heart attack Father   . Breast cancer Neg Hx     SOCIAL HISTORY:  Social History   Socioeconomic History  . Marital status: Single    Spouse name: Not on file  . Number of children: 3  . Years of education: HS  . Highest education level: Not on file  Social Needs  . Financial resource strain: Not on file  . Food insecurity - worry: Not on file  . Food insecurity - inability: Not on file  . Transportation needs - medical: Not on file  . Transportation needs - non-medical: Not on file  Occupational History  . Occupation: Retired  Tobacco Use  . Smoking status: Never Smoker  . Smokeless tobacco: Never Used  Substance and Sexual Activity  . Alcohol use: No  . Drug use: No  . Sexual activity: Not on file  Other Topics Concern  . Not on file  Social History Narrative   Lives at home with husband.   Right-handed.   No daily caffeine use.     PHYSICAL EXAM   Vitals:   04/15/17 1350  BP: 124/77  Pulse: 68  Weight: 155 lb (70.3 kg)  Height: 5\' 2"  (1.575 m)    Not recorded      Body mass index is 28.35 kg/m.  PHYSICAL EXAMNIATION:  Gen: NAD, conversant, well nourised, obese, well groomed                     Cardiovascular: Regular rate rhythm, no peripheral edema, warm, nontender. Eyes: Conjunctivae clear without exudates or hemorrhage Neck: Supple, no carotid bruits. Pulmonary: Clear to auscultation bilaterally   NEUROLOGICAL EXAM:  MENTAL STATUS: Speech:    Speech is normal; fluent and spontaneous with normal comprehension.  Cognition:     Orientation to time, place and person     Normal recent and remote memory     Normal Attention span and concentration     Normal Language, naming, repeating,spontaneous speech     Fund of knowledge   CRANIAL NERVES: CN II: Visual fields are full to confrontation. Fundoscopic exam is normal with sharp discs and no vascular changes. Pupils are round equal and briskly reactive to light. CN III, IV, VI: extraocular  movement are normal. No ptosis. CN V: Facial sensation is intact to pinprick in all 3 divisions bilaterally. Corneal responses are intact.  CN VII: she has occasional left hemifacial spasm, involving left orbicularis oculi, occasionally left cheek, and the orbicularis auris muscles. Mild asymmetry of left face, CN VIII: Hearing is normal to rubbing fingers CN IX, X: Palate elevates symmetrically. Phonation is normal. CN XI: Head turning and shoulder shrug are intact CN XII: Tongue is midline with normal movements and no atrophy.  MOTOR: There is no pronator drift of out-stretched arms. Muscle bulk and tone are normal. Muscle strength is normal.  REFLEXES: Reflexes are 2+  and symmetric at the biceps, triceps, knees, and ankles. Plantar responses are flexor.  SENSORY: Intact to light touch, pinprick, positional sensation and vibratory sensation are intact in fingers and toes.  COORDINATION: Rapid alternating movements and fine finger movements are intact. There is no dysmetria on finger-to-nose and heel-knee-shin.    GAIT/STANCE: Posture is normal. Gait is steady with normal steps, base, arm swing, and turning. Heel and toe walking are normal. Tandem gait is normal.  Romberg is absent.   DIAGNOSTIC DATA (LABS, IMAGING, TESTING) - I reviewed patient records, labs, notes, testing and imaging myself where available.   ASSESSMENT AND PLAN  Patricia Knox is a 82 y.o. female   Left hemifacial spasm Xeomin 50 units, dissolved into 1 cc of NS. Used 50 units  Left orbicularis oculi at 2, 3, 4, 5, 6, 8, 10 (2.5 unitsx 7=17.5 units) total Right orbicularis oculi at 8, 9  (2.5unitsx 2 =5 Units)  Right frontalis  5 units Left frontalis 5 units    Left platysmas 10 units Left zygomatic major 5 units Left mentalis 2.5 units  She will return to clinic in 3 months for repeat injection   Marcial Pacas, M.D. Ph.D.  [facility] [address], [place], [place]  [address] Ph: [phone] Fax: [phone]  CC: [name], MD

## 2018-01-15 ENCOUNTER — Ambulatory Visit
Admission: RE | Admit: 2018-01-15 | Discharge: 2018-01-15 | Disposition: A | Discharge status: Home / Self Care | Payer: Medicare Other | Source: Ambulatory Visit | Attending: Internal Medicine | Admitting: Internal Medicine

## 2018-01-15 DIAGNOSIS — Z1231 Encounter for screening mammogram for malignant neoplasm of breast: Secondary | ICD-10-CM

## 2018-01-18 ENCOUNTER — Ambulatory Visit: Payer: Self-pay | Admitting: Neurology

## 2018-01-18 ENCOUNTER — Other Ambulatory Visit: Payer: Self-pay | Admitting: Internal Medicine

## 2018-01-18 DIAGNOSIS — R928 Other abnormal and inconclusive findings on diagnostic imaging of breast: Secondary | ICD-10-CM

## 2018-01-29 ENCOUNTER — Ambulatory Visit
Admission: RE | Admit: 2018-01-29 | Discharge: 2018-01-29 | Disposition: A | Discharge status: Home / Self Care | Payer: Medicare Other | Source: Ambulatory Visit | Attending: Internal Medicine | Admitting: Internal Medicine

## 2018-01-29 DIAGNOSIS — R928 Other abnormal and inconclusive findings on diagnostic imaging of breast: Secondary | ICD-10-CM

## 2018-02-02 ENCOUNTER — Other Ambulatory Visit: Payer: Self-pay | Admitting: Internal Medicine

## 2018-02-02 DIAGNOSIS — R928 Other abnormal and inconclusive findings on diagnostic imaging of breast: Secondary | ICD-10-CM

## 2018-02-02 DIAGNOSIS — R921 Mammographic calcification found on diagnostic imaging of breast: Secondary | ICD-10-CM

## 2018-02-10 ENCOUNTER — Ambulatory Visit
Admission: RE | Admit: 2018-02-10 | Discharge: 2018-02-10 | Disposition: A | Discharge status: Home / Self Care | Payer: Medicare Other | Source: Ambulatory Visit | Attending: Internal Medicine | Admitting: Internal Medicine

## 2018-02-10 DIAGNOSIS — R921 Mammographic calcification found on diagnostic imaging of breast: Secondary | ICD-10-CM | POA: Diagnosis present

## 2018-02-10 DIAGNOSIS — R928 Other abnormal and inconclusive findings on diagnostic imaging of breast: Secondary | ICD-10-CM | POA: Diagnosis present

## 2018-02-10 HISTORY — PX: BREAST BIOPSY: SHX20

## 2018-02-11 LAB — SURGICAL PATHOLOGY

## 2018-04-14 ENCOUNTER — Ambulatory Visit: Payer: Medicare Other | Admitting: Neurology

## 2018-04-14 ENCOUNTER — Ambulatory Visit: Payer: Self-pay | Admitting: Neurology

## 2018-04-14 ENCOUNTER — Encounter: Payer: Self-pay | Admitting: Neurology

## 2018-04-14 VITALS — BP 167/105 | HR 79 | Ht 62.0 in | Wt 149.0 lb

## 2018-04-14 DIAGNOSIS — G5133 Clonic hemifacial spasm, bilateral: Secondary | ICD-10-CM

## 2018-04-14 DIAGNOSIS — G5139 Clonic hemifacial spasm, unspecified: Secondary | ICD-10-CM

## 2018-04-14 MED ORDER — ONABOTULINUMTOXINA 100 UNITS IJ SOLR
100.0000 [IU] | Freq: Once | INTRAMUSCULAR | Status: AC
  Administered 2018-04-14: 100 [IU] via INTRAMUSCULAR

## 2018-04-14 NOTE — Progress Notes (Signed)
**  Botox 100 units x 1 vial, 2909-0301-49, Lot [id number], Exp 08/2020, office supply.//mck,rn**

## 2018-04-14 NOTE — Progress Notes (Signed)
PATIENT: Patricia Knox DOB: 10/12/1935  Chief Complaint  Patient presents with  . Hemifacial Spasms    Xeomin 50 units x 1 vial - office supply     HISTORICAL  Patricia Knox is a 82 year old right-handed female, seen in refer by her primary care doctor  [name] for continued EMG guided injection for her left hemifacial spasm.  She had a gradual onset left hemifacial spasm for many years,  She began to receive EMG guided incobotulism toxin a by Dr. Sherley Bounds at [facility] works very well for her, last injection was in July 2017,used total 27.5 units, but because of the insurance reason she has to switch to different practice,she hopes to continue injection through our office.  She had a history of restless leg syndrome, complains of frequent bilateral calf muscle cramping, increased recently, woke her up few times each night, despite taking Requip 0.5 milligrams twice a day,  UPDATE Dec 27th 2017: She came in for EMG guided xeomin injection for left hemifacial spasm today, potential side effect pain  UPDATE September 25 2016: She had suboptimal response to previous injection, noticed left cheek area twitching, no significant side effect noticed,  Gabapentin 100 mg 3 tablets every night has worked well for her nighttime leg cramping.  UPDATE January 07 2017:  she responded very well to previous injection, no significant side effect noted  UPDATE Apr 15 2017:  She responded very well to previous injection, noticed recurrent left eye twitching, left cheek muscle twitching now  Update July 20, 2017: She responded well to previous injection, there was no significant side effect noticed, only recent few weeks she noticed recurrent frequent left eye twitching, involving left cheek as well,  UPDATE Oct 20 2017: She responded very well to previous injection  UPDATE January 12 2018: She responded well to previous injection,   UPDATE Apr 14 2018: She did very  well with previous injection, this time will use Botox a 100 units  REVIEW OF SYSTEMS: Full 14 system review of systems performed and notable only for restless leg, cramps,  ALLERGIES: No Known Allergies  HOME MEDICATIONS: Current Outpatient Medications  Medication Sig Dispense Refill  . alendronate (FOSAMAX) 70 MG tablet Take 70 mg by mouth every 7 (seven) days.     [name] aspirin 325 MG tablet Take 325 mg by mouth daily.    . Calcium Carbonate-Vitamin D (CALTRATE 600+D PO) Take 1 capsule by mouth 2 (two) times daily.    [name] gabapentin (NEURONTIN) 100 MG capsule Take 3 capsules (300 mg total) by mouth at bedtime. 90 capsule 11  . incobotulinumtoxinA (XEOMIN) 50 units SOLR injection Inject 50 Units into the muscle every 3 (three) months.    [name] lisinopril-hydrochlorothiazide (PRINZIDE,ZESTORETIC) 20-12.5 MG per tablet Take 1 tablet by mouth daily.     [name] omeprazole (PRILOSEC) 20 MG capsule Take 20 mg by mouth every morning.     [name] rOPINIRole (REQUIP) 0.5 MG tablet Take 0.5 mg by mouth 2 (two) times daily.     . simvastatin (ZOCOR) 80 MG tablet Take 80 mg by mouth daily at 6 PM.     . traZODone (DESYREL) 100 MG tablet Take 100 mg by mouth at bedtime.      No current facility-administered medications for this visit.     PAST MEDICAL HISTORY: Past Medical History:  Diagnosis Date  . 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 GERD (gastroesophageal reflux  disease)   . Heart murmur   . Hemifacial spasm    Left   . History of pulmonary embolus (PE)   . Hyperlipidemia   . Hypertension   . Restless leg syndrome     PAST SURGICAL HISTORY: Past Surgical History:  Procedure Laterality Date  . BREAST BIOPSY Left 07/08/2013   NEG - US biopsy  . BREAST BIOPSY Left 01/30/2016   Stereo - Positive  . 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  . COLONOSCOPY  2002  . TOE  SURGERY  1999    FAMILY HISTORY: Family History  Problem Relation Age of Onset  . Stroke Mother   . Heart attack Father   . Breast cancer Neg Hx     SOCIAL HISTORY:  Social History   Socioeconomic History  . Marital status: Single    Spouse name: Not on file  . Number of children: 3  . Years of education: HS  . Highest education level: Not on file  Social Needs  . Financial resource strain: Not on file  . Food insecurity - worry: Not on file  . Food insecurity - inability: Not on file  . Transportation needs - medical: Not on file  . Transportation needs - non-medical: Not on file  Occupational History  . Occupation: Retired  Tobacco Use  . Smoking status: Never Smoker  . Smokeless tobacco: Never Used  Substance and Sexual Activity  . Alcohol use: No  . Drug use: No  . Sexual activity: Not on file  Other Topics Concern  . Not on file  Social History Narrative   Lives at home with husband.   Right-handed.   No daily caffeine use.     PHYSICAL EXAM   Vitals:   04/15/17 1350  BP: 124/77  Pulse: 68  Weight: 155 lb (70.3 kg)  Height: 5\' 2"  (1.575 m)    Not recorded      Body mass index is 28.35 kg/m.  PHYSICAL EXAMNIATION:  Gen: NAD, conversant, well nourised, obese, well groomed                     Cardiovascular: Regular rate rhythm, no peripheral edema, warm, nontender. Eyes: Conjunctivae clear without exudates or hemorrhage Neck: Supple, no carotid bruits. Pulmonary: Clear to auscultation bilaterally   NEUROLOGICAL EXAM:  MENTAL STATUS: Speech:    Speech is normal; fluent and spontaneous with normal comprehension.  Cognition:     Orientation to time, place and person     Normal recent and remote memory     Normal Attention span and concentration     Normal Language, naming, repeating,spontaneous speech     Fund of knowledge   CRANIAL NERVES: CN II: Visual fields are full to confrontation. Fundoscopic exam is normal with sharp discs and  no vascular changes. Pupils are round equal and briskly reactive to light. CN III, IV, VI: extraocular movement are normal. No ptosis. CN V: Facial sensation is intact to pinprick in all 3 divisions bilaterally. Corneal responses are intact.  CN VII: she has occasional left hemifacial spasm, involving left orbicularis oculi, occasionally left cheek, and the orbicularis auris muscles. Mild asymmetry of left face, CN VIII: Hearing is normal to rubbing fingers CN IX, X: Palate elevates symmetrically. Phonation is normal. CN XI: Head turning and shoulder shrug are intact CN XII: Tongue is midline with normal movements and no atrophy.  MOTOR: There is  no pronator drift of out-stretched arms. Muscle bulk and tone are normal. Muscle strength is normal.  REFLEXES: Reflexes are 2+ and symmetric at the biceps, triceps, knees, and ankles. Plantar responses are flexor.  SENSORY: Intact to light touch, pinprick, positional sensation and vibratory sensation are intact in fingers and toes.  COORDINATION: Rapid alternating movements and fine finger movements are intact. There is no dysmetria on finger-to-nose and heel-knee-shin.    GAIT/STANCE: Posture is normal. Gait is steady with normal steps, base, arm swing, and turning. Heel and toe walking are normal. Tandem gait is normal.  Romberg is absent.   DIAGNOSTIC DATA (LABS, IMAGING, TESTING) - I reviewed patient records, labs, notes, testing and imaging myself where available.   ASSESSMENT AND PLAN  Patricia Knox is a 82 y.o. female   Left hemifacial spasm BOTOX A 100 units,  Used 70 units, discard 30 units  Left orbicularis oculi at 2, 3, 4, 5, 6, 8, 10 (2.5 unitsx 7=17.5 units) total Right orbicularis oculi at 7, 8, 9  (2.5unitsx 3=7.5 Units)  Right frontalis  5 units Left frontalis 5 units    Left platysmas 10 units Left zygomatic major 5 units Left zygomatic minor 5 units  Left platysmus 15 units  She will return to clinic in 3  months for repeat injection   Marcial Pacas, M.D. Ph.D.  [facility] [address] [place],  [address] Ph: [phone] Fax: [phone]  CC: [name], MD

## 2018-05-18 ENCOUNTER — Other Ambulatory Visit: Payer: Self-pay | Admitting: Neurology

## 2018-07-14 ENCOUNTER — Ambulatory Visit: Payer: Self-pay | Admitting: Neurology

## 2018-07-15 ENCOUNTER — Ambulatory Visit: Payer: Self-pay | Admitting: Neurology

## 2018-07-15 ENCOUNTER — Ambulatory Visit: Payer: Medicare Other | Admitting: Neurology

## 2018-07-15 ENCOUNTER — Encounter: Payer: Self-pay | Admitting: Neurology

## 2018-07-15 VITALS — BP 131/86 | HR 95 | Ht 62.0 in | Wt 151.8 lb

## 2018-07-15 DIAGNOSIS — G5133 Clonic hemifacial spasm, bilateral: Secondary | ICD-10-CM

## 2018-07-15 DIAGNOSIS — G5139 Clonic hemifacial spasm, unspecified: Secondary | ICD-10-CM

## 2018-07-15 MED ORDER — ONABOTULINUMTOXINA 100 UNITS IJ SOLR
100.0000 [IU] | Freq: Once | INTRAMUSCULAR | Status: AC
  Administered 2018-07-15: 100 [IU] via INTRAMUSCULAR

## 2018-07-15 NOTE — Progress Notes (Signed)
**  Botox 100 units x 1 vial, NDC 0684-0335-33, Lot [id number], Exp 12/2020, office supply.//mck,rn**

## 2018-07-15 NOTE — Progress Notes (Signed)
PATIENT: Patricia Knox  DOB: 25-Mar-1936  Chief Complaint  Patient presents with  . Hemifacial Spasms    Xeomin 50 units x 1 vial - office supply     HISTORICAL  Patricia Knox is a 83 year old right-handed female, seen in refer by her primary care doctor  [name] for continued EMG guided injection for her left hemifacial spasm.  She had a gradual onset left hemifacial spasm for many years,  She began to receive EMG guided incobotulism toxin a by Dr. Sherley Bounds at [facility] works very well for her, last injection was in July 2017,used total 27.5 units, but because of the insurance reason she has to switch to different practice,she hopes to continue injection through our office.  She had a history of restless leg syndrome, complains of frequent bilateral calf muscle cramping, increased recently, woke her up few times each night, despite taking Requip 0.5 milligrams twice a day,  UPDATE Dec 27th 2017: She came in for EMG guided xeomin injection for left hemifacial spasm today, potential side effect pain  UPDATE September 25 2016: She had suboptimal response to previous injection, noticed left cheek area twitching, no significant side effect noticed,  Gabapentin 100 mg 3 tablets every night has worked well for her nighttime leg cramping.  UPDATE January 07 2017:  she responded very well to previous injection, no significant side effect noted  UPDATE Apr 15 2017:  She responded very well to previous injection, noticed recurrent left eye twitching, left cheek muscle twitching now  Update July 20, 2017: She responded well to previous injection, there was no significant side effect noticed, only recent few weeks she noticed recurrent frequent left eye twitching, involving left cheek as well,  UPDATE Oct 20 2017: She responded very well to previous injection  UPDATE January 12 2018: She responded well to previous injection,   UPDATE Apr 14 2018: She did very  well with previous injection, this time will use Botox a 100 units  UPDATE Jul 15 2018: She did well with previous injection  REVIEW OF SYSTEMS: Full 14 system review of systems performed and notable only for restless leg, cramps,  ALLERGIES: No Known Allergies  HOME MEDICATIONS: Current Outpatient Medications  Medication Sig Dispense Refill  . alendronate (FOSAMAX) 70 MG tablet Take 70 mg by mouth every 7 (seven) days.     [name] aspirin 325 MG tablet Take 325 mg by mouth daily.    . Calcium Carbonate-Vitamin D (CALTRATE 600+D PO) Take 1 capsule by mouth 2 (two) times daily.    [name] gabapentin (NEURONTIN) 100 MG capsule Take 3 capsules (300 mg total) by mouth at bedtime. 90 capsule 11  . incobotulinumtoxinA (XEOMIN) 50 units SOLR injection Inject 50 Units into the muscle every 3 (three) months.    [name] lisinopril-hydrochlorothiazide (PRINZIDE,ZESTORETIC) 20-12.5 MG per tablet Take 1 tablet by mouth daily.     [name] omeprazole (PRILOSEC) 20 MG capsule Take 20 mg by mouth every morning.     [name] rOPINIRole (REQUIP) 0.5 MG tablet Take 0.5 mg by mouth 2 (two) times daily.     . simvastatin (ZOCOR) 80 MG tablet Take 80 mg by mouth daily at 6 PM.     . traZODone (DESYREL) 100 MG tablet Take 100 mg by mouth at bedtime.      No current facility-administered medications for this visit.     PAST MEDICAL HISTORY: Past Medical History:  Diagnosis Date  . 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 GERD (gastroesophageal reflux disease)   . Heart murmur   . Hemifacial spasm    Left   . History of pulmonary embolus (PE)   . Hyperlipidemia   . Hypertension   . Restless leg syndrome     PAST SURGICAL HISTORY: Past Surgical History:  Procedure Laterality Date  . BREAST BIOPSY Left 07/08/2013   NEG - US biopsy  . BREAST BIOPSY Left 01/30/2016   Stereo - Positive  . BREAST SURGERY Left January 2015   Pathology as noted above showed an intraductal papillary  neoplasm with sclerosis.  . COLONOSCOPY  2002  . TOE SURGERY  1999    FAMILY HISTORY: Family History  Problem Relation Age of Onset  . Stroke Mother   . Heart attack Father   . Breast cancer Neg Hx     SOCIAL HISTORY:  Social History   Socioeconomic History  . Marital status: Single    Spouse name: Not on file  . Number of children: 3  . Years of education: HS  . Highest education level: Not on file  Social Needs  . Financial resource strain: Not on file  . Food insecurity - worry: Not on file  . Food insecurity - inability: Not on file  . Transportation needs - medical: Not on file  . Transportation needs - non-medical: Not on file  Occupational History  . Occupation: Retired  Tobacco Use  . Smoking status: Never Smoker  . Smokeless tobacco: Never Used  Substance and Sexual Activity  . Alcohol use: No  . Drug use: No  . Sexual activity: Not on file  Other Topics Concern  . Not on file  Social History Narrative   Lives at home with husband.   Right-handed.   No daily caffeine use.     PHYSICAL EXAM   Vitals:   04/15/17 1350  BP: 124/77  Pulse: 68  Weight: 155 lb (70.3 kg)  Height: 5\' 2"  (1.575 m)    Not recorded      Body mass index is 28.35 kg/m.  PHYSICAL EXAMNIATION:  Gen: NAD, conversant, well nourised, obese, well groomed                     Cardiovascular: Regular rate rhythm, no peripheral edema, warm, nontender. Eyes: Conjunctivae clear without exudates or hemorrhage Neck: Supple, no carotid bruits. Pulmonary: Clear to auscultation bilaterally   NEUROLOGICAL EXAM:  MENTAL STATUS: Speech:    Speech is normal; fluent and spontaneous with normal comprehension.  Cognition:     Orientation to time, place and person     Normal recent and remote memory     Normal Attention span and concentration     Normal Language, naming, repeating,spontaneous speech     Fund of knowledge   CRANIAL NERVES: CN II: Visual fields are full to  confrontation. Fundoscopic exam is normal with sharp discs and no vascular changes. Pupils are round equal and briskly reactive to light. CN III, IV, VI: extraocular movement are normal. No ptosis. CN V: Facial sensation is intact to pinprick in all 3 divisions bilaterally. Corneal responses are intact.  CN VII: she has occasional left hemifacial spasm, involving left orbicularis oculi, occasionally left cheek, and the orbicularis auris muscles. Mild asymmetry of left face, CN VIII: Hearing is normal to rubbing fingers CN IX, X: Palate elevates symmetrically. Phonation is normal. CN XI: Head turning and shoulder shrug are intact CN XII: Tongue  is midline with normal movements and no atrophy.  MOTOR: There is no pronator drift of out-stretched arms. Muscle bulk and tone are normal. Muscle strength is normal.  REFLEXES: Reflexes are 2+ and symmetric at the biceps, triceps, knees, and ankles. Plantar responses are flexor.  SENSORY: Intact to light touch, pinprick, positional sensation and vibratory sensation are intact in fingers and toes.  COORDINATION: Rapid alternating movements and fine finger movements are intact. There is no dysmetria on finger-to-nose and heel-knee-shin.    GAIT/STANCE: Posture is normal. Gait is steady with normal steps, base, arm swing, and turning. Heel and toe walking are normal. Tandem gait is normal.  Romberg is absent.   DIAGNOSTIC DATA (LABS, IMAGING, TESTING) - I reviewed patient records, labs, notes, testing and imaging myself where available.   ASSESSMENT AND PLAN  Saki SHAVONTE ZHAO is a 83 y.o. female   Left hemifacial spasm BOTOX A 100 units,  Used 62.5  units, discard 37.5 units  Left orbicularis oculi at 2, 3, 4, 5, 6, 8,   (2.5 unitsx 6 =15 units) total Right orbicularis oculi at 7, 8, 9  (2.5unitsx 3=7.5 Units)  Right frontalis  5 units Left frontalis 5 units    Left platysmas 10 units Left zygomatic major 5 units Left zygomatic minor 5  units  Left depressor anguli oris 5 units Right depressor anguli oris 5 unit  She will return to clinic in 3 months for repeat injection   Marcial Pacas, M.D. Ph.D.  [facility] [address], [place], [place] [address] Ph: [phone] Fax: [phone]  CC: [name], MD

## 2018-08-26 ENCOUNTER — Other Ambulatory Visit (INDEPENDENT_AMBULATORY_CARE_PROVIDER_SITE_OTHER): Payer: Self-pay | Admitting: Internal Medicine

## 2018-08-26 ENCOUNTER — Ambulatory Visit (INDEPENDENT_AMBULATORY_CARE_PROVIDER_SITE_OTHER): Payer: Medicare Other

## 2018-08-26 ENCOUNTER — Other Ambulatory Visit (INDEPENDENT_AMBULATORY_CARE_PROVIDER_SITE_OTHER): Payer: Self-pay | Admitting: Vascular Surgery

## 2018-08-26 ENCOUNTER — Other Ambulatory Visit: Payer: Self-pay

## 2018-08-26 DIAGNOSIS — M79604 Pain in right leg: Secondary | ICD-10-CM

## 2018-08-26 DIAGNOSIS — M79605 Pain in left leg: Principal | ICD-10-CM

## 2018-09-21 ENCOUNTER — Telehealth: Payer: Self-pay | Admitting: Neurology

## 2018-09-21 NOTE — Telephone Encounter (Signed)
I called the patients insurance and spoke with representative Stanton Kidney. I checked coverage and pre cert requirements for codes [id number] and [phone], these codes are covered and NPR. OFV#[id number]. DW

## 2018-10-13 ENCOUNTER — Ambulatory Visit: Payer: Self-pay | Admitting: Neurology

## 2018-10-13 ENCOUNTER — Ambulatory Visit: Payer: Medicare Other | Admitting: Neurology

## 2018-10-14 ENCOUNTER — Ambulatory Visit: Payer: Self-pay | Admitting: Neurology

## 2018-11-09 NOTE — Telephone Encounter (Signed)
I called and spoke with the patient to remind her of her apt tomorrow and go over mask requirements and check in procedures. DW

## 2018-11-10 ENCOUNTER — Other Ambulatory Visit: Payer: Self-pay

## 2018-11-10 ENCOUNTER — Encounter: Payer: Self-pay | Admitting: Neurology

## 2018-11-10 ENCOUNTER — Ambulatory Visit (INDEPENDENT_AMBULATORY_CARE_PROVIDER_SITE_OTHER): Payer: Medicare Other | Admitting: Neurology

## 2018-11-10 VITALS — BP 134/86 | HR 76 | Temp 97.3°F | Ht 62.0 in | Wt 153.0 lb

## 2018-11-10 DIAGNOSIS — G5139 Clonic hemifacial spasm, unspecified: Secondary | ICD-10-CM

## 2018-11-10 DIAGNOSIS — G5133 Clonic hemifacial spasm, bilateral: Secondary | ICD-10-CM | POA: Diagnosis not present

## 2018-11-10 MED ORDER — ONABOTULINUMTOXINA 100 UNITS IJ SOLR
100.0000 [IU] | Freq: Once | INTRAMUSCULAR | Status: AC
  Administered 2018-11-10: 100 [IU] via INTRAMUSCULAR

## 2018-11-10 NOTE — Progress Notes (Signed)
PATIENT: Patricia Knox  DOB: 07/01/1935  Chief Complaint  Patient presents with  . Hemifacial Spasms    Xeomin 50 units x 1 vial - office supply     HISTORICAL  AZLYN WINGLER is a 83 year old right-handed female, seen in refer by her primary care doctor  [name] for continued EMG guided injection for her left hemifacial spasm.  She had a gradual onset left hemifacial spasm for many years,  She began to receive EMG guided incobotulism toxin a by Dr. Sherley Bounds at [facility] works very well for her, last injection was in July 2017,used total 27.5 units, but because of the insurance reason she has to switch to different practice,she hopes to continue injection through our office.  She had a history of restless leg syndrome, complains of frequent bilateral calf muscle cramping, increased recently, woke her up few times each night, despite taking Requip 0.5 milligrams twice a day,  UPDATE Dec 27th 2017: She came in for EMG guided xeomin injection for left hemifacial spasm today, potential side effect pain  UPDATE September 25 2016: She had suboptimal response to previous injection, noticed left cheek area twitching, no significant side effect noticed,  Gabapentin 100 mg 3 tablets every night has worked well for her nighttime leg cramping.  UPDATE January 07 2017:  she responded very well to previous injection, no significant side effect noted  UPDATE Apr 15 2017:  She responded very well to previous injection, noticed recurrent left eye twitching, left cheek muscle twitching now  Update July 20, 2017: She responded well to previous injection, there was no significant side effect noticed, only recent few weeks she noticed recurrent frequent left eye twitching, involving left cheek as well,  UPDATE Oct 20 2017: She responded very well to previous injection  UPDATE January 12 2018: She responded well to previous injection,   UPDATE Apr 14 2018: She did very  well with previous injection, this time will use Botox a 100 units  UPDATE Jul 15 2018: She did well with previous injection  UPDATE November 10 2018: She responded well to previous injections.  REVIEW OF SYSTEMS: Full 14 system review of systems performed and notable only for above.  ALLERGIES: No Known Allergies  HOME MEDICATIONS: Current Outpatient Medications  Medication Sig Dispense Refill  . alendronate (FOSAMAX) 70 MG tablet Take 70 mg by mouth every 7 (seven) days.     [name] aspirin 325 MG tablet Take 325 mg by mouth daily.    . Calcium Carbonate-Vitamin D (CALTRATE 600+D PO) Take 1 capsule by mouth 2 (two) times daily.    [name] gabapentin (NEURONTIN) 100 MG capsule Take 3 capsules (300 mg total) by mouth at bedtime. 90 capsule 11  . incobotulinumtoxinA (XEOMIN) 50 units SOLR injection Inject 50 Units into the muscle every 3 (three) months.    [name] lisinopril-hydrochlorothiazide (PRINZIDE,ZESTORETIC) 20-12.5 MG per tablet Take 1 tablet by mouth daily.     [name] omeprazole (PRILOSEC) 20 MG capsule Take 20 mg by mouth every morning.     [name] rOPINIRole (REQUIP) 0.5 MG tablet Take 0.5 mg by mouth 2 (two) times daily.     . simvastatin (ZOCOR) 80 MG tablet Take 80 mg by mouth daily at 6 PM.     . traZODone (DESYREL) 100 MG tablet Take 100 mg by mouth at bedtime.      No current facility-administered medications for this visit.     PAST MEDICAL HISTORY: Past Medical History:  Diagnosis  Date  . 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 GERD (gastroesophageal reflux disease)   . Heart murmur   . Hemifacial spasm    Left   . History of pulmonary embolus (PE)   . Hyperlipidemia   . Hypertension   . Restless leg syndrome     PAST SURGICAL HISTORY: Past Surgical History:  Procedure Laterality Date  . BREAST BIOPSY Left 07/08/2013   NEG - US biopsy  . BREAST BIOPSY Left 01/30/2016   Stereo - Positive  . BREAST SURGERY Left January 2015    Pathology as noted above 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  . COLONOSCOPY  2002  . TOE SURGERY  1999    FAMILY HISTORY: Family History  Problem Relation Age of Onset  . Stroke Mother   . Heart attack Father   . Breast cancer Neg Hx     SOCIAL HISTORY:  Social History   Socioeconomic History  . Marital status: Single    Spouse name: Not on file  . Number of children: 3  . Years of education: HS  . Highest education level: Not on file  Social Needs  . Financial resource strain: Not on file  . Food insecurity - worry: Not on file  . Food insecurity - inability: Not on file  . Transportation needs - medical: Not on file  . Transportation needs - non-medical: Not on file  Occupational History  . Occupation: Retired  Tobacco Use  . Smoking status: Never Smoker  . Smokeless tobacco: Never Used  Substance and Sexual Activity  . Alcohol use: No  . Drug use: No  . Sexual activity: Not on file  Other Topics Concern  . Not on file  Social History Narrative   Lives at home with husband.   Right-handed.   No daily caffeine use.     PHYSICAL EXAM   Vitals:   04/15/17 1350  BP: 124/77  Pulse: 68  Weight: 155 lb (70.3 kg)  Height: 5\' 2"  (1.575 m)    Not recorded      Body mass index is 28.35 kg/m.    She has occasional left hemifacial spasm, involving left orbicularis oculi, occasionally left cheek, and the orbicularis auris muscles. Mild asymmetry of left face,     ASSESSMENT AND PLAN  Lesbia MAHAGONY GRIEB is a 83 y.o. female   Left hemifacial spasm BOTOX A 100 units,  Used 62.5  units, discard 37.5 units  Left orbicularis oculi at 2, 3, 4, 5, 6, 8,   (2.5 unitsx 6 =15 units) total Right orbicularis oculi at 7, 8, 9  (2.5unitsx 3=7.5 Units)  Right frontalis  5 units Left frontalis 5 units    Left platysmas 10 units Left zygomatic major 5 units Left zygomatic minor 5 units  Left depressor anguli oris 5 units Right depressor anguli oris  5 unit  She will return to clinic in 3 months for repeat injection   Marcial Pacas, M.D. Ph.D.  [facility] [address], [place], [place] [address] Ph: [phone] Fax: [phone]  CC: [name], MD

## 2018-11-10 NOTE — Progress Notes (Signed)
**  Botox 100 units x 1 vial, NDC 8921-1941-74, Lot [id number], Exp 07/2021, office supply.//mck,rn**

## 2019-02-16 ENCOUNTER — Ambulatory Visit: Payer: Medicare Other | Admitting: Neurology

## 2019-02-25 ENCOUNTER — Other Ambulatory Visit: Payer: Self-pay | Admitting: Internal Medicine

## 2019-02-25 DIAGNOSIS — Z1231 Encounter for screening mammogram for malignant neoplasm of breast: Secondary | ICD-10-CM

## 2019-03-16 ENCOUNTER — Other Ambulatory Visit: Payer: Self-pay

## 2019-03-16 ENCOUNTER — Ambulatory Visit (INDEPENDENT_AMBULATORY_CARE_PROVIDER_SITE_OTHER): Payer: Medicare Other | Admitting: Neurology

## 2019-03-16 ENCOUNTER — Encounter: Payer: Self-pay | Admitting: Neurology

## 2019-03-16 VITALS — BP 128/82 | HR 80 | Temp 97.9°F | Ht 62.0 in | Wt 153.5 lb

## 2019-03-16 DIAGNOSIS — G5132 Clonic hemifacial spasm, left: Secondary | ICD-10-CM

## 2019-03-16 MED ORDER — ONABOTULINUMTOXINA 100 UNITS IJ SOLR
100.0000 [IU] | Freq: Once | INTRAMUSCULAR | Status: AC
  Administered 2019-03-16: 100 [IU] via INTRAMUSCULAR

## 2019-03-16 NOTE — Progress Notes (Signed)
PATIENT: Patricia Knox  DOB: 07/01/1935  Chief Complaint  Patient presents with  . Hemifacial Spasms    Xeomin 50 units x 1 vial - office supply     HISTORICAL  Patricia Knox is a 83 year old right-handed female, seen in refer by her primary care doctor  [name] for continued EMG guided injection for her left hemifacial spasm.  She had a gradual onset left hemifacial spasm for many years,  She began to receive EMG guided incobotulism toxin a by Dr. Sherley Bounds at [facility] works very well for her, last injection was in July 2017,used total 27.5 units, but because of the insurance reason she has to switch to different practice,she hopes to continue injection through our office.  She had a history of restless leg syndrome, complains of frequent bilateral calf muscle cramping, increased recently, woke her up few times each night, despite taking Requip 0.5 milligrams twice a day,  UPDATE Dec 27th 2017: She came in for EMG guided xeomin injection for left hemifacial spasm today, potential side effect pain  UPDATE September 25 2016: She had suboptimal response to previous injection, noticed left cheek area twitching, no significant side effect noticed,  Gabapentin 100 mg 3 tablets every night has worked well for her nighttime leg cramping.  UPDATE January 07 2017:  she responded very well to previous injection, no significant side effect noted  UPDATE Apr 15 2017:  She responded very well to previous injection, noticed recurrent left eye twitching, left cheek muscle twitching now  Update July 20, 2017: She responded well to previous injection, there was no significant side effect noticed, only recent few weeks she noticed recurrent frequent left eye twitching, involving left cheek as well,  UPDATE Oct 20 2017: She responded very well to previous injection  UPDATE January 12 2018: She responded well to previous injection,   UPDATE Apr 14 2018: She did very  well with previous injection, this time will use Botox a 100 units  UPDATE Jul 15 2018: She did well with previous injection  UPDATE November 10 2018: She responded well to previous injections.  REVIEW OF SYSTEMS: Full 14 system review of systems performed and notable only for above.  ALLERGIES: No Known Allergies  HOME MEDICATIONS: Current Outpatient Medications  Medication Sig Dispense Refill  . alendronate (FOSAMAX) 70 MG tablet Take 70 mg by mouth every 7 (seven) days.     [name] aspirin 325 MG tablet Take 325 mg by mouth daily.    . Calcium Carbonate-Vitamin D (CALTRATE 600+D PO) Take 1 capsule by mouth 2 (two) times daily.    [name] gabapentin (NEURONTIN) 100 MG capsule Take 3 capsules (300 mg total) by mouth at bedtime. 90 capsule 11  . incobotulinumtoxinA (XEOMIN) 50 units SOLR injection Inject 50 Units into the muscle every 3 (three) months.    [name] lisinopril-hydrochlorothiazide (PRINZIDE,ZESTORETIC) 20-12.5 MG per tablet Take 1 tablet by mouth daily.     [name] omeprazole (PRILOSEC) 20 MG capsule Take 20 mg by mouth every morning.     [name] rOPINIRole (REQUIP) 0.5 MG tablet Take 0.5 mg by mouth 2 (two) times daily.     . simvastatin (ZOCOR) 80 MG tablet Take 80 mg by mouth daily at 6 PM.     . traZODone (DESYREL) 100 MG tablet Take 100 mg by mouth at bedtime.      No current facility-administered medications for this visit.     PAST MEDICAL HISTORY: Past Medical History:  Diagnosis  Date  . 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 GERD (gastroesophageal reflux disease)   . Heart murmur   . Hemifacial spasm    Left   . History of pulmonary embolus (PE)   . Hyperlipidemia   . Hypertension   . Restless leg syndrome     PAST SURGICAL HISTORY: Past Surgical History:  Procedure Laterality Date  . BREAST BIOPSY Left 07/08/2013   NEG - US biopsy  . BREAST BIOPSY Left 01/30/2016   Stereo - Positive  . BREAST SURGERY Left January 2015    Pathology as noted above 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  . COLONOSCOPY  2002  . TOE SURGERY  1999    FAMILY HISTORY: Family History  Problem Relation Age of Onset  . Stroke Mother   . Heart attack Father   . Breast cancer Neg Hx     SOCIAL HISTORY:  Social History   Socioeconomic History  . Marital status: Single    Spouse name: Not on file  . Number of children: 3  . Years of education: HS  . Highest education level: Not on file  Social Needs  . Financial resource strain: Not on file  . Food insecurity - worry: Not on file  . Food insecurity - inability: Not on file  . Transportation needs - medical: Not on file  . Transportation needs - non-medical: Not on file  Occupational History  . Occupation: Retired  Tobacco Use  . Smoking status: Never Smoker  . Smokeless tobacco: Never Used  Substance and Sexual Activity  . Alcohol use: No  . Drug use: No  . Sexual activity: Not on file  Other Topics Concern  . Not on file  Social History Narrative   Lives at home with husband.   Right-handed.   No daily caffeine use.     PHYSICAL EXAM   Vitals:   04/15/17 1350  BP: 124/77  Pulse: 68  Weight: 155 lb (70.3 kg)  Height: 5\' 2"  (1.575 m)    Not recorded      Body mass index is 28.35 kg/m.    She has occasional left hemifacial spasm, involving left orbicularis oculi, occasionally left cheek, and the orbicularis auris muscles. Mild asymmetry of left face,     ASSESSMENT AND PLAN  Patricia Knox is a 83 y.o. female   Left hemifacial spasm BOTOX A 100 units,  Used 40  units, discard 60 units  Left orbicularis oculi at 2, 3, 4, 5, 6, 8,   (2.5 unitsx 6 =15 units) total Right orbicularis oculi at  8, 9  (2.5unitsx 2=5 Units)  Right corrugate 5 units Left corrugate 5 units Procerus 5 units   Left zygomatic major 5 units    She will return to clinic in 3 months for repeat injection   Marcial Pacas, M.D. Ph.D.  [facility] [address], [place], [place] [address] Ph: [phone] Fax: [phone]  CC: [name], MD

## 2019-03-16 NOTE — Progress Notes (Signed)
**  Botox 100 units x 1 vial, NDC [id number], Lot [id number], Exp 10/2021, office supply.//mck,rn**

## 2019-04-01 ENCOUNTER — Ambulatory Visit
Admission: RE | Admit: 2019-04-01 | Discharge: 2019-04-01 | Disposition: A | Discharge status: Home / Self Care | Payer: Medicare Other | Source: Ambulatory Visit | Attending: Internal Medicine | Admitting: Internal Medicine

## 2019-04-01 DIAGNOSIS — Z1231 Encounter for screening mammogram for malignant neoplasm of breast: Secondary | ICD-10-CM

## 2019-06-16 ENCOUNTER — Ambulatory Visit: Payer: Self-pay | Admitting: Neurology

## 2019-06-26 ENCOUNTER — Other Ambulatory Visit: Payer: Self-pay | Admitting: Neurology

## 2019-07-06 ENCOUNTER — Encounter: Payer: Self-pay | Admitting: Neurology

## 2019-07-06 ENCOUNTER — Ambulatory Visit: Payer: Medicare Other | Admitting: Neurology

## 2019-07-06 ENCOUNTER — Other Ambulatory Visit: Payer: Self-pay

## 2019-07-06 VITALS — BP 132/91 | HR 87 | Temp 96.8°F | Ht 62.0 in | Wt 154.0 lb

## 2019-07-06 DIAGNOSIS — G5132 Clonic hemifacial spasm, left: Secondary | ICD-10-CM

## 2019-07-06 MED ORDER — INCOBOTULINUMTOXINA 50 UNITS IM SOLR
50.0000 [IU] | INTRAMUSCULAR | Status: DC
  Administered 2019-07-06: 15:00:00 50 [IU] via INTRAMUSCULAR

## 2019-07-06 NOTE — Progress Notes (Signed)
PATIENT: Patricia Knox  DOB: 04-06-1936  Chief Complaint  Patient presents with  . Hemifacial Spasms    Xeomin 50 units x 1 vial - office supply     HISTORICAL  Patricia Knox is a 84 year old right-handed female, seen in refer by her primary care doctor  [name] for continued EMG guided injection for her left hemifacial spasm.  She had a gradual onset left hemifacial spasm for many years,  She began to receive EMG guided incobotulism toxin a by Dr. Sherley Bounds at [facility] works very well for her, last injection was in July 2017,used total 27.5 units, but because of the insurance reason she has to switch to different practice,she hopes to continue injection through our office.  She had a history of restless leg syndrome, complains of frequent bilateral calf muscle cramping, increased recently, woke her up few times each night, despite taking Requip 0.5 milligrams twice a day,  UPDATE Dec 27th 2017: She came in for EMG guided xeomin injection for left hemifacial spasm today, potential side effect pain  UPDATE September 25 2016: She had suboptimal response to previous injection, noticed left cheek area twitching, no significant side effect noticed,  Gabapentin 100 mg 3 tablets every night has worked well for her nighttime leg cramping.  UPDATE January 07 2017:  she responded very well to previous injection, no significant side effect noted  UPDATE Apr 15 2017:  She responded very well to previous injection, noticed recurrent left eye twitching, left cheek muscle twitching now  Update July 20, 2017: She responded well to previous injection, there was no significant side effect noticed, only recent few weeks she noticed recurrent frequent left eye twitching, involving left cheek as well,  UPDATE Oct 20 2017: She responded very well to previous injection  UPDATE January 12 2018: She responded well to previous injection,   UPDATE Apr 14 2018: She did very  well with previous injection, this time will use Botox a 100 units  UPDATE Jul 15 2018: She did well with previous injection  UPDATE November 10 2018: She responded well to previous injections.  REVIEW OF SYSTEMS: Full 14 system review of systems performed and notable only for above.  ALLERGIES: No Known Allergies  HOME MEDICATIONS: Current Outpatient Medications  Medication Sig Dispense Refill  . alendronate (FOSAMAX) 70 MG tablet Take 70 mg by mouth every 7 (seven) days.     [name] aspirin 325 MG tablet Take 325 mg by mouth daily.    . Calcium Carbonate-Vitamin D (CALTRATE 600+D PO) Take 1 capsule by mouth 2 (two) times daily.    [name] gabapentin (NEURONTIN) 100 MG capsule Take 3 capsules (300 mg total) by mouth at bedtime. 90 capsule 11  . incobotulinumtoxinA (XEOMIN) 50 units SOLR injection Inject 50 Units into the muscle every 3 (three) months.    [name] lisinopril-hydrochlorothiazide (PRINZIDE,ZESTORETIC) 20-12.5 MG per tablet Take 1 tablet by mouth daily.     [name] omeprazole (PRILOSEC) 20 MG capsule Take 20 mg by mouth every morning.     [name] rOPINIRole (REQUIP) 0.5 MG tablet Take 0.5 mg by mouth 2 (two) times daily.     . simvastatin (ZOCOR) 80 MG tablet Take 80 mg by mouth daily at 6 PM.     . traZODone (DESYREL) 100 MG tablet Take 100 mg by mouth at bedtime.      No current facility-administered medications for this visit.     PAST MEDICAL HISTORY: Past Medical History:  Diagnosis  Date  . 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 GERD (gastroesophageal reflux disease)   . Heart murmur   . Hemifacial spasm    Left   . History of pulmonary embolus (PE)   . Hyperlipidemia   . Hypertension   . Restless leg syndrome     PAST SURGICAL HISTORY: Past Surgical History:  Procedure Laterality Date  . BREAST BIOPSY Left 07/08/2013   NEG - US biopsy  . BREAST BIOPSY Left 01/30/2016   Stereo - Positive  . BREAST SURGERY Left January 2015    Pathology as noted above 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  . COLONOSCOPY  2002  . TOE SURGERY  1999    FAMILY HISTORY: Family History  Problem Relation Age of Onset  . Stroke Mother   . Heart attack Father   . Breast cancer Neg Hx     SOCIAL HISTORY:  Social History   Socioeconomic History  . Marital status: Single    Spouse name: Not on file  . Number of children: 3  . Years of education: HS  . Highest education level: Not on file  Social Needs  . Financial resource strain: Not on file  . Food insecurity - worry: Not on file  . Food insecurity - inability: Not on file  . Transportation needs - medical: Not on file  . Transportation needs - non-medical: Not on file  Occupational History  . Occupation: Retired  Tobacco Use  . Smoking status: Never Smoker  . Smokeless tobacco: Never Used  Substance and Sexual Activity  . Alcohol use: No  . Drug use: No  . Sexual activity: Not on file  Other Topics Concern  . Not on file  Social History Narrative   Lives at home with husband.   Right-handed.   No daily caffeine use.     PHYSICAL EXAM   Vitals:   04/15/17 1350  BP: 124/77  Pulse: 68  Weight: 155 lb (70.3 kg)  Height: 5\' 2"  (1.575 m)    Not recorded      Body mass index is 28.35 kg/m.    She has occasional left hemifacial spasm, involving left orbicularis oculi, occasionally left cheek, and the orbicularis auris muscles. Mild asymmetry of left face,     ASSESSMENT AND PLAN  Patricia Knox is a 84 y.o. female   Left hemifacial spasm Use Xeomin 50 units  Left orbicularis oculi at 2, 3, 4, 5, 6, 8, 10   (2.5 unitsx 7=17.5 units) total Right orbicularis oculi at 7, 8, 9  (2.5unitsx 3=7.5 Units)  Left frontalis 5 units Left zygomatic major 5 units   Left platysmas 15 divided into 3 injection site  She will return to clinic in 3 months for repeat injection   Marcial Pacas, M.D. Ph.D.  [facility] [address] [place], [place] [address] Ph: [phone] Fax: [phone]  CC: [name], MD

## 2019-07-06 NOTE — Progress Notes (Signed)
**  Xeomin 50 units x 1, NDC 0259-1605-01, Lot [id number], Exp 05/2021, office supply.//mck,rn**

## 2019-09-22 ENCOUNTER — Telehealth: Payer: Self-pay | Admitting: *Deleted

## 2019-09-22 NOTE — Telephone Encounter (Signed)
Patient has a Botox appointment on 10/06/2019.  I called [employer] and spoke to [place].  He states that [id number] and [phone] are valid and billable.  They do not require PA. Ref# for this call is [id number].

## 2019-10-06 ENCOUNTER — Encounter: Payer: Self-pay | Admitting: Neurology

## 2019-10-06 ENCOUNTER — Ambulatory Visit: Payer: Medicare Other | Admitting: Neurology

## 2019-10-06 ENCOUNTER — Telehealth: Payer: Self-pay | Admitting: *Deleted

## 2019-10-06 NOTE — Telephone Encounter (Signed)
No showed Xeomin appointment.

## 2019-10-10 ENCOUNTER — Telehealth: Payer: Self-pay | Admitting: Neurology

## 2019-10-10 NOTE — Telephone Encounter (Signed)
Pt called to reschedule her botox apt

## 2019-10-10 NOTE — Telephone Encounter (Signed)
I returned patients call and rescheduled her appointment.

## 2019-10-10 NOTE — Telephone Encounter (Signed)
Pt has called to schedule her Botox, please call

## 2019-10-25 ENCOUNTER — Other Ambulatory Visit: Payer: Self-pay

## 2019-10-25 ENCOUNTER — Ambulatory Visit: Payer: Medicare Other | Admitting: Neurology

## 2019-10-25 ENCOUNTER — Encounter: Payer: Self-pay | Admitting: Neurology

## 2019-10-25 VITALS — BP 137/87 | HR 72 | Ht 62.0 in | Wt 152.0 lb

## 2019-10-25 DIAGNOSIS — G5132 Clonic hemifacial spasm, left: Secondary | ICD-10-CM

## 2019-10-25 MED ORDER — INCOBOTULINUMTOXINA 50 UNITS IM SOLR
50.0000 [IU] | INTRAMUSCULAR | Status: DC
  Administered 2019-10-25: 50 [IU] via INTRAMUSCULAR

## 2019-10-25 NOTE — Progress Notes (Signed)
**  Xeomin 50 units x 1 vial, NDC [id number], Lot [id number], Exp 05/2021, office supply,//mck,rn**

## 2019-10-25 NOTE — Progress Notes (Signed)
PATIENT: Patricia Knox  DOB: 01/26/1936  Chief Complaint  Patient presents with  . Hemifacial Spasms    Xeomin 50 units x 1 vial - office supply     HISTORICAL  Patricia Knox is a 84 year old right-handed female, seen in refer by her primary care doctor  [name] for continued EMG guided injection for her left hemifacial spasm.  She had a gradual onset left hemifacial spasm for many years,  She began to receive EMG guided incobotulism toxin a by Dr. Sherley Bounds at [facility] works very well for her, last injection was in July 2017,used total 27.5 units, but because of the insurance reason she has to switch to different practice,she hopes to continue injection through our office.  She had a history of restless leg syndrome, complains of frequent bilateral calf muscle cramping, increased recently, woke her up few times each night, despite taking Requip 0.5 milligrams twice a day,  UPDATE Dec 27th 2017: She came in for EMG guided xeomin injection for left hemifacial spasm today, potential side effect pain  UPDATE September 25 2016: She had suboptimal response to previous injection, noticed left cheek area twitching, no significant side effect noticed,  Gabapentin 100 mg 3 tablets every night has worked well for her nighttime leg cramping.  UPDATE January 07 2017:  she responded very well to previous injection, no significant side effect noted  UPDATE Apr 15 2017:  She responded very well to previous injection, noticed recurrent left eye twitching, left cheek muscle twitching now  Update July 20, 2017: She responded well to previous injection, there was no significant side effect noticed, only recent few weeks she noticed recurrent frequent left eye twitching, involving left cheek as well,  UPDATE Oct 20 2017: She responded very well to previous injection  UPDATE January 12 2018: She responded well to previous injection,   UPDATE Apr 14 2018: She did very  well with previous injection, this time will use Botox a 100 units  UPDATE Jul 15 2018: She did well with previous injection  UPDATE November 10 2018: She responded well to previous injections.  UPDATE Oct 25 2019: She did well, no significant side effect noted.  REVIEW OF SYSTEMS: Full 14 system review of systems performed and notable only for above.  ALLERGIES: No Known Allergies  HOME MEDICATIONS: Current Outpatient Medications  Medication Sig Dispense Refill  . alendronate (FOSAMAX) 70 MG tablet Take 70 mg by mouth every 7 (seven) days.     [name] aspirin 325 MG tablet Take 325 mg by mouth daily.    . Calcium Carbonate-Vitamin D (CALTRATE 600+D PO) Take 1 capsule by mouth 2 (two) times daily.    [name] gabapentin (NEURONTIN) 100 MG capsule Take 3 capsules (300 mg total) by mouth at bedtime. 90 capsule 11  . incobotulinumtoxinA (XEOMIN) 50 units SOLR injection Inject 50 Units into the muscle every 3 (three) months.    [name] lisinopril-hydrochlorothiazide (PRINZIDE,ZESTORETIC) 20-12.5 MG per tablet Take 1 tablet by mouth daily.     [name] omeprazole (PRILOSEC) 20 MG capsule Take 20 mg by mouth every morning.     [name] rOPINIRole (REQUIP) 0.5 MG tablet Take 0.5 mg by mouth 2 (two) times daily.     . simvastatin (ZOCOR) 80 MG tablet Take 80 mg by mouth daily at 6 PM.     . traZODone (DESYREL) 100 MG tablet Take 100 mg by mouth at bedtime.      No current facility-administered medications for this  visit.     PAST MEDICAL HISTORY: Past Medical History:  Diagnosis Date  . 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 GERD (gastroesophageal reflux disease)   . Heart murmur   . Hemifacial spasm    Left   . History of pulmonary embolus (PE)   . Hyperlipidemia   . Hypertension   . Restless leg syndrome     PAST SURGICAL HISTORY: Past Surgical History:  Procedure Laterality Date  . BREAST BIOPSY Left 07/08/2013   NEG - US biopsy  . BREAST BIOPSY Left  01/30/2016   Stereo - Positive  . 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  . COLONOSCOPY  2002  . TOE SURGERY  1999    FAMILY HISTORY: Family History  Problem Relation Age of Onset  . Stroke Mother   . Heart attack Father   . Breast cancer Neg Hx     SOCIAL HISTORY:  Social History   Socioeconomic History  . Marital status: Single    Spouse name: Not on file  . Number of children: 3  . Years of education: HS  . Highest education level: Not on file  Social Needs  . Financial resource strain: Not on file  . Food insecurity - worry: Not on file  . Food insecurity - inability: Not on file  . Transportation needs - medical: Not on file  . Transportation needs - non-medical: Not on file  Occupational History  . Occupation: Retired  Tobacco Use  . Smoking status: Never Smoker  . Smokeless tobacco: Never Used  Substance and Sexual Activity  . Alcohol use: No  . Drug use: No  . Sexual activity: Not on file  Other Topics Concern  . Not on file  Social History Narrative   Lives at home with husband.   Right-handed.   No daily caffeine use.     PHYSICAL EXAM   Vitals:   04/15/17 1350  BP: 124/77  Pulse: 68  Weight: 155 lb (70.3 kg)  Height: 5\' 2"  (1.575 m)    Not recorded      Body mass index is 28.35 kg/m.    She has frequent l left hemifacial spasm, involving left orbicularis oculi, occasionally left cheek, and the orbicularis auris muscles. Mild asymmetry of left face,     ASSESSMENT AND PLAN  Patricia Knox is a 84 y.o. female   Left hemifacial spasm Use Xeomin 50 units  Left orbicularis oculi at 2, 3, 4, 5, 6, 8, 10   (2.5 unitsx 7=17.5 units) total Right orbicularis oculi at 7, 8, 9  (2.5unitsx 3=7.5 Units)  Left zygomatic major 5 units   Left platysmas 15 divided into 3 injection site  Left depressor anguli oris 5 units  She will return to clinic in 3 months for  repeat injection   Marcial Pacas, M.D. Ph.D.  [facility] [address] [place], [place] [address] Ph: [phone] Fax: [phone]  CC: [name], MD

## 2019-12-14 ENCOUNTER — Encounter: Payer: Medicare Other | Admitting: Podiatry

## 2019-12-14 ENCOUNTER — Ambulatory Visit: Payer: Medicare Other

## 2020-01-11 ENCOUNTER — Encounter: Payer: Self-pay | Admitting: Podiatry

## 2020-01-11 ENCOUNTER — Other Ambulatory Visit: Payer: Self-pay

## 2020-01-11 ENCOUNTER — Ambulatory Visit: Payer: Medicare Other | Admitting: Neurology

## 2020-01-11 ENCOUNTER — Ambulatory Visit: Payer: Medicare Other | Admitting: Podiatry

## 2020-01-11 DIAGNOSIS — M79676 Pain in unspecified toe(s): Secondary | ICD-10-CM | POA: Diagnosis not present

## 2020-01-11 DIAGNOSIS — B351 Tinea unguium: Secondary | ICD-10-CM | POA: Diagnosis not present

## 2020-01-11 DIAGNOSIS — Q828 Other specified congenital malformations of skin: Secondary | ICD-10-CM | POA: Diagnosis not present

## 2020-01-11 DIAGNOSIS — M2041 Other hammer toe(s) (acquired), right foot: Secondary | ICD-10-CM | POA: Diagnosis not present

## 2020-01-11 NOTE — Progress Notes (Signed)
She presents today chief complaint of painful elongated toenails and calluses plantar aspect of the right foot.  She has severe hammertoe deformity that is painful to her.  Would like to know about surgical options.  Objective: Vital signs are stable alert oriented x3 there is no erythema edema cellulitis drainage or odor she has severe hammertoe deformities with hallux valgus deformities of the right foot resulting in plantarflexed second metatarsal and a reactive hyperkeratotic lesion.  Toenails are thick yellow dystrophic-like mycotic and painful.  Assessment: Pain in limb secondary to digital deformity and mycotic nails.  Callus plantar aspect second metatarsal right foot secondary to digital deformity.  Plan: Debrided all reactive hyperkeratotic tissue debrided toenails 1 through 5 bilaterally.  Discussed amputation at the level of the metatarsophalangeal joint and resection of the head of the second metatarsal.  She understands this is amenable to it she is going to think about it and decide as to whether or not she wants to have this done I will follow-up with her at that time.

## 2020-01-24 NOTE — Progress Notes (Signed)
This encounter was created in error - please disregard.

## 2020-01-25 ENCOUNTER — Ambulatory Visit: Payer: Medicare Other | Admitting: Neurology

## 2020-01-30 ENCOUNTER — Telehealth: Payer: Self-pay | Admitting: Neurology

## 2020-01-30 MED ORDER — XEOMIN 50 UNITS IM SOLR: 50.0000 [IU] | INTRAMUSCULAR | 3 refills | Status: DC

## 2020-01-30 NOTE — Telephone Encounter (Signed)
I called [place] [phone]) and spoke with Ronalee Belts to check if [id number], [phone], or [phone] require PA. Ronalee Belts states no PA is required and patient can be B/B or use [employer]. Patient will be B/B for appointment on 9/1.   Can you send patient's prescription to [facility]?

## 2020-01-30 NOTE — Addendum Note (Signed)
Addended by: Noberto Retort C on: 01/30/2020 12:14 PM   Modules accepted: Orders

## 2020-01-30 NOTE — Telephone Encounter (Signed)
Last injection for Xeomin 50 units for hemifacial spasms. Rx sent to pharmacy.

## 2020-02-01 ENCOUNTER — Ambulatory Visit: Payer: Medicare Other | Admitting: Neurology

## 2020-02-01 ENCOUNTER — Telehealth: Payer: Self-pay | Admitting: *Deleted

## 2020-02-01 NOTE — Telephone Encounter (Signed)
No showed Xeomin appointment.

## 2020-02-29 ENCOUNTER — Ambulatory Visit (INDEPENDENT_AMBULATORY_CARE_PROVIDER_SITE_OTHER): Payer: Medicare Other | Admitting: Podiatry

## 2020-02-29 ENCOUNTER — Ambulatory Visit (INDEPENDENT_AMBULATORY_CARE_PROVIDER_SITE_OTHER): Payer: Medicare Other

## 2020-02-29 ENCOUNTER — Other Ambulatory Visit: Payer: Self-pay

## 2020-02-29 DIAGNOSIS — M2041 Other hammer toe(s) (acquired), right foot: Secondary | ICD-10-CM

## 2020-02-29 DIAGNOSIS — M778 Other enthesopathies, not elsewhere classified: Secondary | ICD-10-CM

## 2020-02-29 DIAGNOSIS — Q828 Other specified congenital malformations of skin: Secondary | ICD-10-CM

## 2020-02-29 NOTE — Patient Instructions (Signed)
Pre-Operative Instructions  Congratulations, you have decided to take an important step towards improving your quality of life.  You can be assured that the doctors and staff at [facility] will be with you every step of the way.  Here are some important things you should know:  1. Plan to be at the surgery center/hospital at least 1 (one) hour prior to your scheduled time, unless otherwise directed by the surgical center/hospital staff.  You must have a responsible adult accompany you, remain during the surgery and drive you home.  Make sure you have directions to the surgical center/hospital to ensure you arrive on time. 2. If you are having surgery at [facility] or [facility], you will need a copy of your medical history and physical form from your family physician within one month prior to the date of surgery. We will give you a form for your primary physician to complete.  3. We make every effort to accommodate the date you request for surgery.  However, there are times where surgery dates or times have to be moved.  We will contact you as soon as possible if a change in schedule is required.   4. No aspirin/ibuprofen for one week before surgery.  If you are on aspirin, any non-steroidal anti-inflammatory medications (Mobic, Aleve, Ibuprofen) should not be taken seven (7) days prior to your surgery.  You make take Tylenol for pain prior to surgery.  5. Medications - If you are taking daily heart and blood pressure medications, seizure, reflux, allergy, asthma, anxiety, pain or diabetes medications, make sure you notify the surgery center/hospital before the day of surgery so they can tell you which medications you should take or avoid the day of surgery. 6. No food or drink after midnight the night before surgery unless directed otherwise by surgical center/hospital staff. 7. No alcoholic beverages 24-hours prior to surgery.  No smoking 24-hours prior or 24-hours after  surgery. 8. Wear loose pants or shorts. They should be loose enough to fit over bandages, boots, and casts. 9. Don't wear slip-on shoes. Sneakers are preferred. 10. Bring your boot with you to the surgery center/hospital.  Also bring crutches or a walker if your physician has prescribed it for you.  If you do not have this equipment, it will be provided for you after surgery. 11. If you have not been contacted by the surgery center/hospital by the day before your surgery, call to confirm the date and time of your surgery. 12. Leave-time from work may vary depending on the type of surgery you have.  Appropriate arrangements should be made prior to surgery with your employer. 13. Prescriptions will be provided immediately following surgery by your doctor.  Fill these as soon as possible after surgery and take the medication as directed. Pain medications will not be refilled on weekends and must be approved by the doctor. 14. Remove nail polish on the operative foot and avoid getting pedicures prior to surgery. 15. Wash the night before surgery.  The night before surgery wash the foot and leg well with water and the antibacterial soap provided. Be sure to pay special attention to beneath the toenails and in between the toes.  Wash for at least three (3) minutes. Rinse thoroughly with water and dry well with a towel.  Perform this wash unless told not to do so by your physician.  Enclosed: 1 Ice pack (please put in freezer the night before surgery)   1 Hibiclens skin cleaner     Pre-op instructions  If you have any questions regarding the instructions, please do not hesitate to call our office.  [place]: [address], [place], [place] [address] -- [phone]  [place]: [address] [place] , [place] [address] -- [phone]  [place]: [address], [place], [place] [address] -- [phone]   Website: [email or web address] 

## 2020-02-29 NOTE — Progress Notes (Signed)
Subjective:  Patient ID: Patricia Knox, female    DOB: [birth year],  MRN: [id number] HPI No chief complaint on file.   84 y.o. female presents with the above complaint.   ROS: Denies fever chills nausea vomiting muscle aches pains calf pain back pain chest pain shortness of breath.  She presents today wanting to discuss amputation of the second toe of the right foot.  Past Medical History:  Diagnosis Date  . 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 GERD (gastroesophageal reflux disease)   . Heart murmur   . Hemifacial spasm    Left   . History of pulmonary embolus (PE)   . Hyperlipidemia   . Hypertension   . Restless leg syndrome    Past Surgical History:  Procedure Laterality Date  . ABDOMINAL HYSTERECTOMY    . BREAST BIOPSY Left 07/08/2013   NEG - US biopsy  . 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 BREAST BIOPSY Left 02/10/2018   Affim Biopsy-(calcs/"X" clip) path pending  . BREAST EXCISIONAL BIOPSY    . BREAST LUMPECTOMY Left 03/14/2016   Procedure: BREAST LUMPECTOMY;  Surgeon: [name], MD;  Location: [facility] ORS;  Service: General;  Laterality: Left;  . 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  . COLONOSCOPY  2002  . TOE SURGERY  1999    Current Outpatient Medications:  .  alendronate (FOSAMAX) 70 MG tablet, Take 70 mg by mouth every 7 (seven) days. , Disp: , Rfl:  .  amLODipine (NORVASC) 5 MG tablet, Take 5 mg by mouth daily., Disp: , Rfl: 6 .  aspirin 325 MG tablet, Take 325 mg by mouth daily., Disp: , Rfl:  .  atorvastatin (LIPITOR) 40 MG tablet, Take 40 mg by mouth at bedtime., Disp: , Rfl: 0 .  Calcium Carbonate-Vitamin D (CALTRATE 600+D PO), Take 1 capsule by mouth 2 (two) times daily., Disp: , Rfl:  .  gabapentin (NEURONTIN) 100 MG capsule, TAKE 3 CAPSULES BY  MOUTH AT BEDTIME, Disp: 90 capsule, Rfl: 11 .  incobotulinumtoxinA (XEOMIN) 50 units SOLR injection, Inject 50 Units into the muscle every 3 (three) months., Disp: 1 each, Rfl: 3 .  omeprazole (PRILOSEC) 20 MG capsule, Take 20 mg by mouth every morning. , Disp: , Rfl:  .  rOPINIRole (REQUIP) 0.5 MG tablet, Take 0.5 mg by mouth 2 (two) times daily. , Disp: , Rfl:  .  simvastatin (ZOCOR) 80 MG tablet, Take 80 mg by mouth daily at 6 PM. , Disp: , Rfl:  .  spironolactone (ALDACTONE) 25 MG tablet, Take 25 mg by mouth daily., Disp: , Rfl: 6 .  traZODone (DESYREL) 100 MG tablet, Take 100 mg by mouth at bedtime. , Disp: , Rfl:   No Known Allergies Review of Systems Objective:  There were no vitals filed for this visit.  General: Well developed, nourished, in no acute distress, alert and oriented x3   Dermatological: Skin is warm, dry and supple bilateral. Nails x 10 are well maintained; remaining integument appears unremarkable at this time. There are no open sores, no preulcerative lesions, no rash or signs of infection present.  Vascular: Dorsalis Pedis artery and Posterior Tibial artery pedal pulses are 2/4 bilateral with immedate capillary fill time. Pedal hair growth present. No varicosities and no lower extremity edema present bilateral.   Neruologic: Grossly intact  via light touch bilateral. Vibratory intact via tuning fork bilateral. Protective threshold with Semmes Wienstein monofilament intact to all pedal sites bilateral. Patellar and Achilles deep tendon reflexes 2+ bilateral. No Babinski or clonus noted bilateral.   Musculoskeletal: No gross boney pedal deformities bilateral. No pain, crepitus, or limitation noted with foot and ankle range of motion bilateral. Muscular strength 5/5 in all groups tested bilateral.  Hallux abductovalgus deformity and neck cocked up hammertoe deformity second right with severe dislocation.  The toe is completely flail and completely dislocated from the second  metatarsophalangeal joint.  There is a large area of reactive hyperkeratosis beneath the second metatarsal head which is very prominent.  Gait: Unassisted, Nonantalgic.    Radiographs:  None taken  Assessment & Plan:   Assessment: Painful hammertoe deformity with plantarflexed second metatarsal right foot.  Plan: Discussed etiology pathology conservative versus surgical therapies.  At this point we discussed complete amputation of the second toe at the level of the metatarsophalangeal joint with resection of the head of the second metatarsal.  She understands this and is amenable to it.  We did discuss the possible postop complications which may include but are not limited to postop pain bleeding swelling infection recurrence need for further surgery overcorrection under correction also digit loss of limb loss of life.     Patricia Knox T. [place], [place]

## 2020-03-01 ENCOUNTER — Telehealth: Payer: Self-pay

## 2020-03-01 NOTE — Telephone Encounter (Signed)
Received surgery paperwork from the [place] office. Left a message for Daija to call back to schedule surgery.

## 2020-03-06 ENCOUNTER — Telehealth: Payer: Self-pay

## 2020-03-06 NOTE — Telephone Encounter (Signed)
DOS 03/30/2020  AMPUTATION TOR MPJ JOINT 2ND RT - 28820 MET HEAD RESECTION 2ND RT - 28112  [facility] MEDICARE EFFECTIVE DATE - 06/03/2019  PLAN DEDUCTIBLE - $0.00 OUT OF POCKET - $3600.00 W/ $3219.20 REMAINING  CO-INSURANCE 0% / Day OUTPATIENT SURGERY 0% / [place] $295 / Day OUTPATIENT SURGERY $295 / [place]  Notification or Prior Authorization is not required for the requested services  Decision ID #:[id number]

## 2020-03-16 ENCOUNTER — Telehealth: Payer: Self-pay

## 2020-03-16 NOTE — Telephone Encounter (Signed)
Patricia Knox called to reschedule her surgery scheduled for 03/30/2020 with Dr. Milinda Pointer. Her husband is being put in hospice and she needs to take care of him first. I have her rescheduled for 06/29/2020. Left message for Caren Griffins at [facility] of new date.

## 2020-03-28 NOTE — Telephone Encounter (Signed)
Patient called wanting to reschedule this appointment. I rescheduled her for Dr. Rhea Belton next available on December 8th. I mailed patient a reminder card.

## 2020-04-04 ENCOUNTER — Encounter: Payer: Medicare Other | Admitting: Podiatry

## 2020-04-11 ENCOUNTER — Encounter: Payer: Medicare Other | Admitting: Podiatry

## 2020-04-18 ENCOUNTER — Encounter: Payer: Medicare Other | Admitting: Podiatry

## 2020-04-24 ENCOUNTER — Encounter: Payer: Medicare Other | Admitting: Podiatry

## 2020-05-02 ENCOUNTER — Encounter: Payer: Medicare Other | Admitting: Podiatry

## 2020-05-09 ENCOUNTER — Encounter: Payer: Self-pay | Admitting: Neurology

## 2020-05-09 ENCOUNTER — Encounter: Payer: Medicare Other | Admitting: Podiatry

## 2020-05-09 ENCOUNTER — Ambulatory Visit: Payer: Medicare Other | Admitting: Neurology

## 2020-05-09 VITALS — BP 132/85 | HR 54 | Ht 62.0 in | Wt 141.5 lb

## 2020-05-09 DIAGNOSIS — G5132 Clonic hemifacial spasm, left: Secondary | ICD-10-CM

## 2020-05-09 MED ORDER — INCOBOTULINUMTOXINA 50 UNITS IM SOLR
50.0000 [IU] | INTRAMUSCULAR | Status: DC
  Administered 2020-05-09: 50 [IU] via INTRAMUSCULAR

## 2020-05-09 NOTE — Progress Notes (Signed)
PATIENT: Patricia Knox  DOB: August 31, 1935  Chief Complaint  Patient presents with  . Hemifacial Spasms    Xeomin 50 units x 1 vial - office supply     HISTORICAL  Patricia Knox is a 84 year old right-handed female, seen in refer by her primary care doctor  [name] for continued EMG guided injection for her left hemifacial spasm.  She had a gradual onset left hemifacial spasm for many years,  She began to receive EMG guided incobotulism toxin a by Dr. Sherley Bounds at [facility] works very well for her, last injection was in July 2017,used total 27.5 units, but because of the insurance reason she has to switch to different practice,she hopes to continue injection through our office.  She had a history of restless leg syndrome, complains of frequent bilateral calf muscle cramping, increased recently, woke her up few times each night, despite taking Requip 0.5 milligrams twice a day,  UPDATE Dec 27th 2017: She came in for EMG guided xeomin injection for left hemifacial spasm today, potential side effect pain  UPDATE September 25 2016: She had suboptimal response to previous injection, noticed left cheek area twitching, no significant side effect noticed,  Gabapentin 100 mg 3 tablets every night has worked well for her nighttime leg cramping.  UPDATE January 07 2017:  she responded very well to previous injection, no significant side effect noted  UPDATE Apr 15 2017:  She responded very well to previous injection, noticed recurrent left eye twitching, left cheek muscle twitching now  Update July 20, 2017: She responded well to previous injection, there was no significant side effect noticed, only recent few weeks she noticed recurrent frequent left eye twitching, involving left cheek as well,  UPDATE Oct 20 2017: She responded very well to previous injection  UPDATE January 12 2018: She responded well to previous injection,   UPDATE Apr 14 2018: She did very  well with previous injection, this time will use Botox a 100 units  UPDATE Jul 15 2018: She did well with previous injection  UPDATE November 10 2018: She responded well to previous injections.  UPDATE Oct 25 2019: She did well, no significant side effect noted.  UPDATE May 09 2020: She had long interview from previous injection, complains of worsening recurrent left hemifacial muscle spasm  REVIEW OF SYSTEMS: Full 14 system review of systems performed and notable only for above.  ALLERGIES: No Known Allergies  HOME MEDICATIONS: Current Outpatient Medications  Medication Sig Dispense Refill  . alendronate (FOSAMAX) 70 MG tablet Take 70 mg by mouth every 7 (seven) days.     [name] aspirin 325 MG tablet Take 325 mg by mouth daily.    . Calcium Carbonate-Vitamin D (CALTRATE 600+D PO) Take 1 capsule by mouth 2 (two) times daily.    [name] gabapentin (NEURONTIN) 100 MG capsule Take 3 capsules (300 mg total) by mouth at bedtime. 90 capsule 11  . incobotulinumtoxinA (XEOMIN) 50 units SOLR injection Inject 50 Units into the muscle every 3 (three) months.    [name] lisinopril-hydrochlorothiazide (PRINZIDE,ZESTORETIC) 20-12.5 MG per tablet Take 1 tablet by mouth daily.     [name] omeprazole (PRILOSEC) 20 MG capsule Take 20 mg by mouth every morning.     [name] rOPINIRole (REQUIP) 0.5 MG tablet Take 0.5 mg by mouth 2 (two) times daily.     . simvastatin (ZOCOR) 80 MG tablet Take 80 mg by mouth daily at 6 PM.     . traZODone (DESYREL) 100  MG tablet Take 100 mg by mouth at bedtime.      No current facility-administered medications for this visit.     PAST MEDICAL HISTORY: Past Medical History:  Diagnosis Date  . 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 GERD (gastroesophageal reflux disease)   . Heart murmur   . Hemifacial spasm    Left   . History of pulmonary embolus (PE)   . Hyperlipidemia   . Hypertension   . Restless leg syndrome     PAST SURGICAL  HISTORY: Past Surgical History:  Procedure Laterality Date  . BREAST BIOPSY Left 07/08/2013   NEG - US biopsy  . BREAST BIOPSY Left 01/30/2016   Stereo - Positive  . 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  . COLONOSCOPY  2002  . TOE SURGERY  1999    FAMILY HISTORY: Family History  Problem Relation Age of Onset  . Stroke Mother   . Heart attack Father   . Breast cancer Neg Hx     SOCIAL HISTORY:  Social History   Socioeconomic History  . Marital status: Single    Spouse name: Not on file  . Number of children: 3  . Years of education: HS  . Highest education level: Not on file  Social Needs  . Financial resource strain: Not on file  . Food insecurity - worry: Not on file  . Food insecurity - inability: Not on file  . Transportation needs - medical: Not on file  . Transportation needs - non-medical: Not on file  Occupational History  . Occupation: Retired  Tobacco Use  . Smoking status: Never Smoker  . Smokeless tobacco: Never Used  Substance and Sexual Activity  . Alcohol use: No  . Drug use: No  . Sexual activity: Not on file  Other Topics Concern  . Not on file  Social History Narrative   Lives at home with husband.   Right-handed.   No daily caffeine use.     PHYSICAL EXAM   Vitals:   04/15/17 1350  BP: 124/77  Pulse: 68  Weight: 155 lb (70.3 kg)  Height: 5\' 2"  (1.575 m)    Not recorded      Body mass index is 28.35 kg/m.    She has frequent l left hemifacial spasm, involving left orbicularis oculi, occasionally left cheek, and the orbicularis auris muscles. Mild asymmetry of left face,     ASSESSMENT AND PLAN  Patricia Knox is a 84 y.o. female    Left hemifacial spasm  Xeomin 50 units in 1 cc of NS, we used 30 units, discard 20 units  Left orbicularis oculi at 2, 3, 4, 5, 6, 8, 10   (2.5 unitsx 7=17.5 units) total Right orbicularis oculi at 7, 8, 9   (2.5unitsx 3=7.5 Units)  Left zygomatic major 5 units   She will return to clinic in 3 months for repeat injection   Marcial Pacas, M.D. Ph.D.  [facility] [address] [place], [place] [address] Ph: [phone] Fax: [phone]  CC: [name], MD

## 2020-05-09 NOTE — Progress Notes (Signed)
**  Xeomin 50 units x 1 vial, NDC 0259-1605-01, Lot [id number], Exp 05/2021, office supply.//mck,rn** 

## 2020-05-16 ENCOUNTER — Encounter: Payer: Medicare Other | Admitting: Podiatry

## 2020-06-14 ENCOUNTER — Telehealth: Payer: Self-pay | Admitting: Podiatry

## 2020-06-14 NOTE — Telephone Encounter (Signed)
DOS: 06/29/2020  Procedures: Met. Head Resection 2nd Rt (28112) and Amputation Toe MPJ Joint 2nd Rt (21194)  [employer] Medicare Effective From 06/02/2020 - Present  Deductible: $0 Out of Network: $3,600 with $0 met and $3,600 remaining. CoInsurance: 100% Copay: $195 or $295  Notification or Prior Authorization is not required for the requested services  Decision ID #:[id number]  The number above acknowledges your inquiry and our response. Please write this number down and refer to it for future inquiries. Coverage and payment for an item or service is governed by the member's benefit plan document, and, if applicable, the provider's participation agreement with the Health Plan.

## 2020-06-28 ENCOUNTER — Other Ambulatory Visit: Payer: Self-pay | Admitting: Podiatry

## 2020-06-28 MED ORDER — HYDROCODONE-ACETAMINOPHEN 10-325 MG PO TABS
1.0000 | ORAL_TABLET | Freq: Four times a day (QID) | ORAL | 0 refills | Status: AC | PRN
Start: 2020-06-28 — End: 2020-07-05

## 2020-06-28 MED ORDER — ONDANSETRON HCL 4 MG PO TABS: 4.0000 mg | ORAL_TABLET | Freq: Three times a day (TID) | ORAL | 0 refills | Status: DC | PRN

## 2020-06-28 MED ORDER — CEPHALEXIN 500 MG PO CAPS: 500.0000 mg | ORAL_CAPSULE | Freq: Three times a day (TID) | ORAL | 0 refills | Status: DC

## 2020-07-04 ENCOUNTER — Encounter: Payer: Medicare Other | Admitting: Podiatry

## 2020-07-11 ENCOUNTER — Encounter: Payer: Medicare Other | Admitting: Podiatry

## 2020-07-21 ENCOUNTER — Other Ambulatory Visit: Payer: Self-pay | Admitting: Neurology

## 2020-07-25 ENCOUNTER — Encounter: Payer: Medicare Other | Admitting: Podiatry

## 2020-08-08 ENCOUNTER — Encounter: Payer: Medicare Other | Admitting: Podiatry

## 2020-08-15 ENCOUNTER — Ambulatory Visit: Payer: Medicare Other | Admitting: Neurology

## 2020-08-15 ENCOUNTER — Telehealth: Payer: Self-pay | Admitting: Neurology

## 2020-08-15 NOTE — Telephone Encounter (Signed)
Patient was scheduled to have a Xeomin appointment today. I called the patient to cancel due to recent billing issues with [facility]. I let patient know that we are going to enroll her in [place]. I am going to send her prescription in to them today. Advised patient I will check with them Monday or Tuesday next week to see where they are in the process and then I will call her to reschedule the appointment.

## 2020-08-21 NOTE — Telephone Encounter (Signed)
Received denial from [facility] stating that Xeomin is denied because it is not covered. Dysport is listed as a covered drug. Reference [id number].  Is it okay to continue authorization for Dysport?

## 2020-08-21 NOTE — Telephone Encounter (Signed)
Please let patient knows above change, if she has uncontrollable left hemifacial spasm, it is okay to bring her in soon for Xeomin sample; if her hemifacial spasm is tolerable go ahead start with Dysport preauthorization, 300 units

## 2020-08-23 NOTE — Telephone Encounter (Signed)
I called patient today. She is agreeable to waiting on Dysport authorization as her spasms are under control at this time. I advised patient that I would submit the authorization request for Dysport today, and should hear back by Monday. I thanked her for her patience, and advised that I would call her with an update next week.

## 2020-08-23 NOTE — Telephone Encounter (Addendum)
Submitted PA request for Dysport 300 via CMM.   Received approval. Reference #: [id number] (08/23/20- 11/23/20).  I called [facility] and spoke with Jasmine in the pharmacy to give a verbal prescription. She states she will submit it for processing.

## 2020-08-27 NOTE — Telephone Encounter (Signed)
Patient returned my call and advised that her partner is currently not doing well and she does not know how much longer he has. She states she would like to push this process off a week or two. I advised patient to call back when she is ready.

## 2020-08-27 NOTE — Telephone Encounter (Signed)
I called [facility] and spoke with Glennon Mac to check status of the Dysport order. She states that the order is ready to go out but the patient's consent is needed for shipment. I called the patient and LVM to advise this information. Left [facility]'s phone number as well as my direct line.

## 2020-09-18 NOTE — Telephone Encounter (Signed)
Patient called wanting to reschedule her Dysport appointment. She requested a late afternoon appointment due to work. We scheduled for 6/1 at 3:30. I advised I would call her if we get a sooner appointment. I provided patient with [facility]'s phone number with instructions on what to do to provide consent for medication shipment. Advised her to call me back with any questions.

## 2020-09-19 NOTE — Telephone Encounter (Signed)
Patient called in to let me know that she spoke with [facility] and they told her that the Dysport has been shipped to our office already. I called [facility] and spoke with Peter Congo to verify this. Peter Congo states she does not see an active order.

## 2020-09-21 ENCOUNTER — Other Ambulatory Visit: Payer: Self-pay | Admitting: Internal Medicine

## 2020-09-21 DIAGNOSIS — E782 Mixed hyperlipidemia: Secondary | ICD-10-CM | POA: Diagnosis not present

## 2020-09-21 DIAGNOSIS — I361 Nonrheumatic tricuspid (valve) insufficiency: Secondary | ICD-10-CM | POA: Diagnosis not present

## 2020-09-21 DIAGNOSIS — G2581 Restless legs syndrome: Secondary | ICD-10-CM | POA: Diagnosis not present

## 2020-09-21 DIAGNOSIS — R7302 Impaired glucose tolerance (oral): Secondary | ICD-10-CM | POA: Diagnosis not present

## 2020-09-21 DIAGNOSIS — I10 Essential (primary) hypertension: Secondary | ICD-10-CM | POA: Diagnosis not present

## 2020-09-21 DIAGNOSIS — Z1231 Encounter for screening mammogram for malignant neoplasm of breast: Secondary | ICD-10-CM

## 2020-09-25 NOTE — Telephone Encounter (Signed)
I called [facility] to follow-up on order status. I spoke with Lovey Newcomer who states that patient has a $290.00 co-pay. Lovey Newcomer states the patient has asked for financial assistance through the pharmacy. Lovey Newcomer states they are working on finding that for patient.

## 2020-09-28 DIAGNOSIS — I361 Nonrheumatic tricuspid (valve) insufficiency: Secondary | ICD-10-CM | POA: Diagnosis not present

## 2020-10-02 ENCOUNTER — Ambulatory Visit
Admission: RE | Admit: 2020-10-02 | Discharge: 2020-10-02 | Disposition: A | Discharge status: Home / Self Care | Payer: Medicare Other | Source: Ambulatory Visit | Attending: Internal Medicine | Admitting: Internal Medicine

## 2020-10-02 ENCOUNTER — Other Ambulatory Visit: Payer: Self-pay

## 2020-10-02 DIAGNOSIS — Z1231 Encounter for screening mammogram for malignant neoplasm of breast: Secondary | ICD-10-CM | POA: Insufficient documentation

## 2020-10-08 ENCOUNTER — Other Ambulatory Visit: Payer: Self-pay

## 2020-10-08 ENCOUNTER — Encounter: Payer: Self-pay | Admitting: Podiatry

## 2020-10-08 ENCOUNTER — Ambulatory Visit: Payer: Medicare Other | Admitting: Podiatry

## 2020-10-08 DIAGNOSIS — M2041 Other hammer toe(s) (acquired), right foot: Secondary | ICD-10-CM

## 2020-10-08 NOTE — Progress Notes (Signed)
She presents today for follow-up consult of her hammertoe amputation second digit right foot.  She would like to go over with me once again today.  She had to cancel her last planned surgery the day before surgery secondary to her husband dying of liver cancer.  Objective: Vital signs are stable alert oriented x3 severe hammertoe deformity hallux valgus deformity overlapping hammertoe deformity second right painful palpation resulting in reactive hyper keratoma plantar aspect of the right foot.  Assessment: Hammertoe deformity overlapping second toe right foot.  Hallux valgus deformity with poor keratoma benign skin lesion.  Plan: Debridement of benign skin lesion today consented her today for amputation of the second toe at the level of the metatarsophalangeal joint we once again went over the pros and cons of the surgery and the possible side effects which may include but not limited to postop pain bleeding swelling infection recurrence need for further surgery overcorrection under correction loss of limb loss of life.

## 2020-10-09 NOTE — Telephone Encounter (Signed)
I called [facility] and spoke with Crystal to check status of order. Crystal states the patient has not given consent for shipment. I called patient but was not able to reach her. LVM with [facility]'s number and my number as well.

## 2020-10-10 NOTE — Telephone Encounter (Signed)
Patient returned my call. She states she hasn't received a call from the pharmacy, but states it could be her phone. I provided her with [facility]'s phone number again. Patient states she will call them.

## 2020-10-10 NOTE — Telephone Encounter (Signed)
Received call from [facility] with [place] to schedule Dysport delivery. Dysport TBD 5/17.

## 2020-10-15 ENCOUNTER — Telehealth: Payer: Self-pay | Admitting: Urology

## 2020-10-15 NOTE — Telephone Encounter (Signed)
DOS - 11/02/20  AMPUTATION MPJ JOINT 2ND RIGHT --- 28820  [facility] EFFECTIVE DATE - 06/02/20   PLAN DEDUCTIBLE -  $0.00 W/  $0.00 REMAINING OUT OF POCKET - $3,600.00 W/ $3,245.00 REMAINING COINSURANCE - 0% COPAY - $295    PER [employer] WEB SITE FOR CPT CODE 76195 Notification or Prior Authorization is not required for the requested services  Decision ID #:[id number]

## 2020-10-16 NOTE — Telephone Encounter (Signed)
Received (1) 300 unit vial of Dysport today from [facility].

## 2020-10-31 ENCOUNTER — Other Ambulatory Visit: Payer: Self-pay | Admitting: Podiatry

## 2020-10-31 ENCOUNTER — Ambulatory Visit: Payer: Medicare Other | Admitting: Neurology

## 2020-10-31 ENCOUNTER — Encounter: Payer: Self-pay | Admitting: Neurology

## 2020-10-31 ENCOUNTER — Other Ambulatory Visit: Payer: Self-pay

## 2020-10-31 VITALS — BP 132/83 | HR 98 | Ht 62.0 in | Wt 142.0 lb

## 2020-10-31 DIAGNOSIS — G5132 Clonic hemifacial spasm, left: Secondary | ICD-10-CM

## 2020-10-31 MED ORDER — ONDANSETRON HCL 4 MG PO TABS: 4.0000 mg | ORAL_TABLET | Freq: Three times a day (TID) | ORAL | 0 refills | Status: DC | PRN

## 2020-10-31 MED ORDER — OXYCODONE-ACETAMINOPHEN 10-325 MG PO TABS: 1.0000 | ORAL_TABLET | Freq: Three times a day (TID) | ORAL | 0 refills | Status: AC | PRN

## 2020-10-31 MED ORDER — CEPHALEXIN 500 MG PO CAPS: 500.0000 mg | ORAL_CAPSULE | Freq: Three times a day (TID) | ORAL | 0 refills | Status: DC

## 2020-10-31 NOTE — Progress Notes (Signed)
PATIENT: Patricia Knox  DOB: [birth year]  Chief Complaint  Patient presents with  . Hemifacial Spasms    Dysport 300 units x 1 vial - office supply     HISTORICAL  Patricia Knox is a 85 year old right-handed female, seen in refer by her primary care doctor  [name] for continued EMG guided injection for her left hemifacial spasm.  She had a gradual onset left hemifacial spasm for many years,  She began to receive EMG guided incobotulism toxin a by Dr. Sherley Bounds at [facility] works very well for her, last injection was in July 2017,used total 27.5 units, but because of the insurance reason she has to switch to different practice,she hopes to continue injection through our office.  She had a history of restless leg syndrome, complains of frequent bilateral calf muscle cramping, increased recently, woke her up few times each night, despite taking Requip 0.5 milligrams twice a day,  UPDATE Dec 27th 2017: She came in for EMG guided xeomin injection for left hemifacial spasm today, potential side effect pain  UPDATE September 25 2016: She had suboptimal response to previous injection, noticed left cheek area twitching, no significant side effect noticed,  Gabapentin 100 mg 3 tablets every night has worked well for her nighttime leg cramping.  UPDATE January 07 2017:  she responded very well to previous injection, no significant side effect noted  UPDATE Apr 15 2017:  She responded very well to previous injection, noticed recurrent left eye twitching, left cheek muscle twitching now  Update July 20, 2017: She responded well to previous injection, there was no significant side effect noticed, only recent few weeks she noticed recurrent frequent left eye twitching, involving left cheek as well,  UPDATE Oct 20 2017: She responded very well to previous injection  UPDATE January 12 2018: She responded well to previous injection,   UPDATE Apr 14 2018: She did  very well with previous injection, this time will use Botox a 100 units  UPDATE Jul 15 2018: She did well with previous injection  UPDATE November 10 2018: She responded well to previous injections.  UPDATE Oct 25 2019: She did well, no significant side effect noted.  UPDATE May 09 2020: She had long interview from previous injection, complains of worsening recurrent left hemifacial muscle spasm  UPDATE October 31 2020: It has been 6 months since previous injection due to insurance reasons, she has to switch to Dysport 300 units per insurance, previously only used Xeomin 50 units, she not noticed frequent left hemifacial muscle spasm to the point of closing her left eye sometimes  REVIEW OF SYSTEMS: Full 14 system review of systems performed and notable only for above.  ALLERGIES: No Known Allergies  HOME MEDICATIONS: Current Outpatient Medications  Medication Sig Dispense Refill  . alendronate (FOSAMAX) 70 MG tablet Take 70 mg by mouth every 7 (seven) days.     [name] aspirin 325 MG tablet Take 325 mg by mouth daily.    . Calcium Carbonate-Vitamin D (CALTRATE 600+D PO) Take 1 capsule by mouth 2 (two) times daily.    [name] gabapentin (NEURONTIN) 100 MG capsule Take 3 capsules (300 mg total) by mouth at bedtime. 90 capsule 11  . incobotulinumtoxinA (XEOMIN) 50 units SOLR injection Inject 50 Units into the muscle every 3 (three) months.    [name] lisinopril-hydrochlorothiazide (PRINZIDE,ZESTORETIC) 20-12.5 MG per tablet Take 1 tablet by mouth daily.     [name] omeprazole (PRILOSEC) 20 MG capsule Take 20 mg  by mouth every morning.     [name] rOPINIRole (REQUIP) 0.5 MG tablet Take 0.5 mg by mouth 2 (two) times daily.     . simvastatin (ZOCOR) 80 MG tablet Take 80 mg by mouth daily at 6 PM.     . traZODone (DESYREL) 100 MG tablet Take 100 mg by mouth at bedtime.      No current facility-administered medications for this visit.     PAST MEDICAL HISTORY: Past Medical History:  Diagnosis Date  . 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 GERD (gastroesophageal reflux disease)   . Heart murmur   . Hemifacial spasm    Left   . History of pulmonary embolus (PE)   . Hyperlipidemia   . Hypertension   . Restless leg syndrome     PAST SURGICAL HISTORY: Past Surgical History:  Procedure Laterality Date  . BREAST BIOPSY Left 07/08/2013   NEG - US biopsy  . BREAST BIOPSY Left 01/30/2016   Stereo - Positive  . 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  . COLONOSCOPY  2002  . TOE SURGERY  1999    FAMILY HISTORY: Family History  Problem Relation Age of Onset  . Stroke Mother   . Heart attack Father   . Breast cancer Neg Hx     SOCIAL HISTORY:  Social History   Socioeconomic History  . Marital status: Single    Spouse name: Not on file  . Number of children: 3  . Years of education: HS  . Highest education level: Not on file  Social Needs  . Financial resource strain: Not on file  . Food insecurity - worry: Not on file  . Food insecurity - inability: Not on file  . Transportation needs - medical: Not on file  . Transportation needs - non-medical: Not on file  Occupational History  . Occupation: Retired  Tobacco Use  . Smoking status: Never Smoker  . Smokeless tobacco: Never Used  Substance and Sexual Activity  . Alcohol use: No  . Drug use: No  . Sexual activity: Not on file  Other Topics Concern  . Not on file  Social History Narrative   Lives at home with husband.   Right-handed.   No daily caffeine use.     PHYSICAL EXAM   Vitals:   04/15/17 1350  BP: 124/77  Pulse: 68  Weight: 155 lb (70.3 kg)  Height: 5\' 2"  (1.575 m)    Not recorded      Body mass index is 28.35 kg/m.    She has frequent l left hemifacial spasm, involving left orbicularis oculi, frequent involvement of left cheek, left frontalis, and the orbicularis auris muscles.  Mild asymmetry of left face,     ASSESSMENT AND PLAN  Patricia Knox is a 85 y.o. female    Left hemifacial spasm Dysport 300 units units in 2.0 cc of NS, we used 1.0 cc, discard 1.0 cc.  Left orbicularis oculi at 2, 3, 4, 5, 6, 8, 10   (0.05 ccx7= 0.35 cc) Right orbicularis oculi at  8, 9  (0.05 cc  x 2= 0.10 cc)  Left zygomatic major 0.05 cc x3= 0.15cc Left nasalis 0.05 x2=0.1cc Left depressor anguli oris 0.05x2=0.1 cc Left platysmas 0.05x4=0.2cc  She will return to clinic in 3 months for repeat injection   Marcial Pacas, M.D. Ph.D.  [facility] [phone]  [address] [place], [place] [address] Ph: [phone] Fax: [phone]  CC: [name], MD

## 2020-10-31 NOTE — Progress Notes (Signed)
**  Dysport 300 units x 1 vial, NDC [address]-2, Lot [id number], Exp 05/01/2021, specialty pharmacy.//mck,rn**

## 2020-11-02 DIAGNOSIS — M25571 Pain in right ankle and joints of right foot: Secondary | ICD-10-CM | POA: Diagnosis not present

## 2020-11-02 DIAGNOSIS — M2041 Other hammer toe(s) (acquired), right foot: Secondary | ICD-10-CM | POA: Diagnosis not present

## 2020-11-02 DIAGNOSIS — M2042 Other hammer toe(s) (acquired), left foot: Secondary | ICD-10-CM | POA: Diagnosis not present

## 2020-11-07 ENCOUNTER — Other Ambulatory Visit: Payer: Self-pay

## 2020-11-07 ENCOUNTER — Ambulatory Visit (INDEPENDENT_AMBULATORY_CARE_PROVIDER_SITE_OTHER): Payer: Medicare Other | Admitting: Podiatry

## 2020-11-07 ENCOUNTER — Ambulatory Visit (INDEPENDENT_AMBULATORY_CARE_PROVIDER_SITE_OTHER): Payer: Medicare Other

## 2020-11-07 ENCOUNTER — Encounter: Payer: Self-pay | Admitting: Podiatry

## 2020-11-07 DIAGNOSIS — M2041 Other hammer toe(s) (acquired), right foot: Secondary | ICD-10-CM

## 2020-11-07 DIAGNOSIS — Z9889 Other specified postprocedural states: Secondary | ICD-10-CM

## 2020-11-07 NOTE — Progress Notes (Signed)
She presents today for her first postop visit date of surgery 11/02/2020 amputation second toe right foot.  Denies fever chills nausea vomiting aches pains back pain chest pain shortness of breath.  States that she is really not had any pain and has not taken any medicine.  Objective: Vital signs are stable she alert oriented x3.  Rest of dressing intact was removed demonstrates no erythema edema/drainage or odor sutures are intact where the second toe was right foot.  Assessment: Well-healing surgical foot right.  Plan: Redressed today dressed a compressive dressing follow-up with her in 1 week hopefully can leave sutures at that time.

## 2020-11-19 ENCOUNTER — Other Ambulatory Visit: Payer: Self-pay

## 2020-11-19 ENCOUNTER — Encounter: Payer: Self-pay | Admitting: Podiatry

## 2020-11-19 ENCOUNTER — Ambulatory Visit (INDEPENDENT_AMBULATORY_CARE_PROVIDER_SITE_OTHER): Payer: Medicare Other | Admitting: Podiatry

## 2020-11-19 DIAGNOSIS — M2041 Other hammer toe(s) (acquired), right foot: Secondary | ICD-10-CM

## 2020-11-19 DIAGNOSIS — Z9889 Other specified postprocedural states: Secondary | ICD-10-CM

## 2020-11-19 NOTE — Progress Notes (Signed)
She presents today for her second postop visit date of surgery 11/02/2020 disarticulation of the second toe at the level of the metatarsal phalangeal joint.  States that she has had absolutely no pain.  Objective: Sutures are intact today margins well coapted sutures were removed margins remain well coapted right foot.  No signs of infection.  Assessment: Well-healing surgical foot right.  Plan: Allow her back into her regular shoe gear.  I will follow-up with her in 2 to 4 weeks.

## 2020-11-21 ENCOUNTER — Ambulatory Visit: Payer: Medicare Other | Admitting: Neurology

## 2020-12-05 ENCOUNTER — Ambulatory Visit (INDEPENDENT_AMBULATORY_CARE_PROVIDER_SITE_OTHER): Payer: Medicare Other | Admitting: Podiatry

## 2020-12-05 ENCOUNTER — Encounter: Payer: Self-pay | Admitting: Podiatry

## 2020-12-05 ENCOUNTER — Other Ambulatory Visit: Payer: Self-pay

## 2020-12-05 DIAGNOSIS — M2041 Other hammer toe(s) (acquired), right foot: Secondary | ICD-10-CM

## 2020-12-05 DIAGNOSIS — Z9889 Other specified postprocedural states: Secondary | ICD-10-CM

## 2020-12-05 NOTE — Progress Notes (Signed)
She presents today for postop visit date of surgery 11/02/2020 disarticulation of the second toe at the level of the metatarsal phalangeal joint.  She states that is doing very well though she thinks she may have a couple stitches left.  Objective: Vital signs are stable she is alert and oriented x3.  There is no erythema edema cellulitis drainage or odor and there are 2 Prolene stitches remaining.  These were removed today there is no dehiscence no signs of infection.  Assessment: Well-healing surgical foot.  Plan: Follow-up with me on an as-needed basis.

## 2020-12-24 ENCOUNTER — Telehealth: Payer: Self-pay | Admitting: Emergency Medicine

## 2020-12-24 NOTE — Telephone Encounter (Signed)
PA for Dysport started on [facility] Awaiting determination from [facility]   Approval of Dysport, Request Reference Number: [id number]. DYSPORT INJ 300UNIT is approved through 03/14/2021

## 2020-12-26 ENCOUNTER — Encounter: Payer: Medicare Other | Admitting: Podiatry

## 2021-01-21 NOTE — Telephone Encounter (Signed)
Patient has an appointment on 8/31. I called [facility], Dysport TBD 8/24.

## 2021-01-23 NOTE — Telephone Encounter (Signed)
Received (1) 300 unit vial of Dysport today from [facility].

## 2021-01-30 ENCOUNTER — Encounter: Payer: Self-pay | Admitting: Neurology

## 2021-01-30 ENCOUNTER — Ambulatory Visit: Payer: Medicare Other | Admitting: Neurology

## 2021-01-30 VITALS — BP 148/94 | HR 89 | Ht 62.0 in | Wt 144.0 lb

## 2021-01-30 DIAGNOSIS — G5132 Clonic hemifacial spasm, left: Secondary | ICD-10-CM

## 2021-01-30 NOTE — Progress Notes (Signed)
PATIENT: Patricia Knox  DOB: 29-Nov-1935  Chief Complaint  Patient presents with   Hemifacial Spasms    Dysport 300 units x 1 vial - office supply     HISTORICAL  Patricia Knox is a 85 year old right-handed female, seen in refer by her primary care doctor  [name] for continued EMG guided injection for her left hemifacial spasm.  She had a gradual onset left hemifacial spasm for many years,  She began to receive EMG guided incobotulism toxin a by Dr. Sherley Bounds at [facility] works very well for her, last injection was in July 2017,used total 27.5 units, but because of the insurance reason she has to switch to different practice,she hopes to continue injection through our office.  She had a history of restless leg syndrome, complains of frequent bilateral calf muscle cramping, increased recently, woke her up few times each night, despite taking Requip 0.5 milligrams twice a day,  UPDATE Dec 27th 2017: She came in for EMG guided xeomin injection for left hemifacial spasm today, potential side effect pain  UPDATE September 25 2016: She had suboptimal response to previous injection, noticed left cheek area twitching, no significant side effect noticed,  Gabapentin 100 mg 3 tablets every night has worked well for her nighttime leg cramping.  UPDATE January 07 2017:  she responded very well to previous injection, no significant side effect noted  UPDATE Apr 15 2017:  She responded very well to previous injection, noticed recurrent left eye twitching, left cheek muscle twitching now  Update July 20, 2017: She responded well to previous injection, there was no significant side effect noticed, only recent few weeks she noticed recurrent frequent left eye twitching, involving left cheek as well,  UPDATE Oct 20 2017: She responded very well to previous injection  UPDATE January 12 2018: She responded well to previous injection,   UPDATE Apr 14 2018: She did very  well with previous injection, this time will use Botox a 100 units  UPDATE Jul 15 2018: She did well with previous injection  UPDATE November 10 2018: She responded well to previous injections.  UPDATE Oct 25 2019: She did well, no significant side effect noted.  UPDATE May 09 2020: She had long interview from previous injection, complains of worsening recurrent left hemifacial muscle spasm  UPDATE October 31 2020: It has been 6 months since previous injection due to insurance reasons, she has to switch to Dysport 300 units per insurance, previously only used Xeomin 50 units, she not noticed frequent left hemifacial muscle spasm to the point of closing her left eye sometimes  UPDATE January 30 2021: She responded well to previous injection,   REVIEW OF SYSTEMS: Full 14 system review of systems performed and notable only for above.  ALLERGIES: No Known Allergies  HOME MEDICATIONS: Current Outpatient Medications  Medication Sig Dispense Refill   alendronate (FOSAMAX) 70 MG tablet Take 70 mg by mouth every 7 (seven) days.      aspirin 325 MG tablet Take 325 mg by mouth daily.     Calcium Carbonate-Vitamin D (CALTRATE 600+D PO) Take 1 capsule by mouth 2 (two) times daily.     gabapentin (NEURONTIN) 100 MG capsule Take 3 capsules (300 mg total) by mouth at bedtime. 90 capsule 11   incobotulinumtoxinA (XEOMIN) 50 units SOLR injection Inject 50 Units into the muscle every 3 (three) months.     lisinopril-hydrochlorothiazide (PRINZIDE,ZESTORETIC) 20-12.5 MG per tablet Take 1 tablet by mouth daily.  omeprazole (PRILOSEC) 20 MG capsule Take 20 mg by mouth every morning.      rOPINIRole (REQUIP) 0.5 MG tablet Take 0.5 mg by mouth 2 (two) times daily.      simvastatin (ZOCOR) 80 MG tablet Take 80 mg by mouth daily at 6 PM.      traZODone (DESYREL) 100 MG tablet Take 100 mg by mouth at bedtime.      No current facility-administered medications for this visit.     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   COLONOSCOPY  2002   TOE SURGERY  1999    FAMILY HISTORY: Family History  Problem Relation Age of Onset   Stroke Mother    Heart attack Father    Breast cancer Neg Hx     SOCIAL HISTORY:  Social History   Socioeconomic History   Marital status: Single    Spouse name: Not on file   Number of children: 3   Years of education: HS   Highest education level: Not on file  Social Needs   Financial resource strain: Not on file   Food insecurity - worry: Not on file   Food insecurity - inability: Not on file   Transportation needs - medical: Not on file   Transportation needs - non-medical: Not on file  Occupational History   Occupation: Retired  Tobacco Use   Smoking status: Never Smoker   Smokeless tobacco: Never Used  Substance and Sexual Activity   Alcohol use: No   Drug use: No   Sexual activity: Not on file  Other Topics Concern   Not on file  Social History Narrative   Lives at home with husband.   Right-handed.   No daily caffeine use.     PHYSICAL EXAM   Vitals:   04/15/17 1350  BP: 124/77  Pulse: 68  Weight: 155 lb (70.3 kg)  Height: '5\' 2"'$  (1.575 m)    Not recorded       Body mass index is 28.35 kg/m.    She has frequent l left hemifacial spasm, involving left orbicularis oculi, frequent involvement of left cheek, left frontalis, and the  orbicularis auris muscles. Mild asymmetry of left face, droopiness of left face     ASSESSMENT AND PLAN  Patricia Knox is a 85 y.o. female    Left hemifacial spasm Dysport 300 units units in 2.0 cc of NS, we used 0.9 cc, discard 1.1 cc.  Left orbicularis oculi at 2, 3, 4, 5, 6, 8, 10   (0.05 ccx7= 0.35 cc) Right orbicularis oculi at  8, 9  (0.05 cc  x 2= 0.10 cc)  Left zygomatic major 0.05 cc x3= 0.15cc Left platysmas 0.05x4=0.2cc  She will return to clinic in 3 months for repeat injection   Marcial Pacas, M.D. Ph.D.  [facility] [address], [place], [place] [address] Ph: [phone] Fax: [phone]  CC: [name], MD

## 2021-01-30 NOTE — Progress Notes (Signed)
**  Dysport 300 units x 1 vial, NDC [id number], Lot [id number], Exp 08/30/2021, specialty pharmacy.//mck,rn**

## 2021-02-18 DIAGNOSIS — Z859 Personal history of malignant neoplasm, unspecified: Secondary | ICD-10-CM | POA: Diagnosis not present

## 2021-02-18 DIAGNOSIS — L570 Actinic keratosis: Secondary | ICD-10-CM | POA: Diagnosis not present

## 2021-02-18 DIAGNOSIS — B372 Candidiasis of skin and nail: Secondary | ICD-10-CM | POA: Diagnosis not present

## 2021-02-18 DIAGNOSIS — L578 Other skin changes due to chronic exposure to nonionizing radiation: Secondary | ICD-10-CM | POA: Diagnosis not present

## 2021-02-18 DIAGNOSIS — I872 Venous insufficiency (chronic) (peripheral): Secondary | ICD-10-CM | POA: Diagnosis not present

## 2021-02-18 DIAGNOSIS — L720 Epidermal cyst: Secondary | ICD-10-CM | POA: Diagnosis not present

## 2021-02-18 DIAGNOSIS — Z872 Personal history of diseases of the skin and subcutaneous tissue: Secondary | ICD-10-CM | POA: Diagnosis not present

## 2021-03-22 DIAGNOSIS — M8589 Other specified disorders of bone density and structure, multiple sites: Secondary | ICD-10-CM | POA: Diagnosis not present

## 2021-03-22 DIAGNOSIS — G2581 Restless legs syndrome: Secondary | ICD-10-CM | POA: Diagnosis not present

## 2021-03-22 DIAGNOSIS — E782 Mixed hyperlipidemia: Secondary | ICD-10-CM | POA: Diagnosis not present

## 2021-03-22 DIAGNOSIS — I10 Essential (primary) hypertension: Secondary | ICD-10-CM | POA: Diagnosis not present

## 2021-03-22 DIAGNOSIS — R7302 Impaired glucose tolerance (oral): Secondary | ICD-10-CM | POA: Diagnosis not present

## 2021-03-22 DIAGNOSIS — Z23 Encounter for immunization: Secondary | ICD-10-CM | POA: Diagnosis not present

## 2021-03-27 ENCOUNTER — Telehealth: Payer: Self-pay | Admitting: Neurology

## 2021-03-27 NOTE — Telephone Encounter (Signed)
Patient's next Botox appointment is 11/30. As of 04/02/21, [facility] Medicare will require PA for Dysport. I submitted PA for 300 units of Dysport ([id number]) every 12 weeks for G51.32 using [employer] portal. PA was approved, PA #[id number] (03/27/21- 03/27/22). Patient will continue to use [facility].  Patient's PA for pharmacy benefit ([facility]) expired 10/13 ([id number]). I initiated new PA via CMM. PA was approved, [id number] (03/27/21- 06/27/21).

## 2021-04-10 NOTE — Telephone Encounter (Signed)
Received (1) 300 unit vial of Dysport today from [facility].

## 2021-05-01 ENCOUNTER — Other Ambulatory Visit: Payer: Self-pay

## 2021-05-01 ENCOUNTER — Encounter: Payer: Self-pay | Admitting: Neurology

## 2021-05-01 ENCOUNTER — Ambulatory Visit: Payer: Medicare Other | Admitting: Neurology

## 2021-05-01 VITALS — BP 147/91 | HR 79 | Ht 62.0 in | Wt 144.0 lb

## 2021-05-01 DIAGNOSIS — G5132 Clonic hemifacial spasm, left: Secondary | ICD-10-CM | POA: Diagnosis not present

## 2021-05-01 NOTE — Progress Notes (Signed)
PATIENT: Patricia Knox  DOB: 04/10/1936  Chief Complaint  Patient presents with   Hemifacial Spasms    Dysport 300 units x 1 vial - office supply     HISTORICAL  Patricia Knox is a 85 year old right-handed female, seen in refer by her primary care doctor  Patricia Knox for continued EMG guided injection for her left hemifacial spasm.  She had a gradual onset left hemifacial spasm for many years,  She began to receive EMG guided incobotulism toxin a by Dr. Sherley Knox at [facility] works very well for her, last injection was in July 2017,used total 27.5 units, but because of the insurance reason she has to switch to different practice,she hopes to continue injection through our office.  She had a history of restless leg syndrome, complains of frequent bilateral calf muscle cramping, increased recently, woke her up few times each night, despite taking Requip 0.5 milligrams twice a day,  UPDATE Dec 27th 2017: She came in for EMG guided xeomin injection for left hemifacial spasm today, potential side effect pain  UPDATE September 25 2016: She had suboptimal response to previous injection, noticed left cheek area twitching, no significant side effect noticed,  Gabapentin 100 mg 3 tablets every night has worked well for her nighttime leg cramping.  UPDATE January 07 2017:  she responded very well to previous injection, no significant side effect noted  UPDATE Apr 15 2017:  She responded very well to previous injection, noticed recurrent left eye twitching, left cheek muscle twitching now  Update July 20, 2017: She responded well to previous injection, there was no significant side effect noticed, only recent few weeks she noticed recurrent frequent left eye twitching, involving left cheek as well,  UPDATE Oct 20 2017: She responded very well to previous injection  UPDATE January 12 2018: She responded well to previous injection,   UPDATE Apr 14 2018: She did very  well with previous injection, this time will use Botox a 100 units  UPDATE Jul 15 2018: She did well with previous injection  UPDATE November 10 2018: She responded well to previous injections.  UPDATE Oct 25 2019: She did well, no significant side effect noted.  UPDATE May 09 2020: She had long interview from previous injection, complains of worsening recurrent left hemifacial muscle spasm  UPDATE October 31 2020: It has been 6 months since previous injection due to insurance reasons, she has to switch to Dysport 300 units per insurance, previously only used Xeomin 50 units, she not noticed frequent left hemifacial muscle spasm to the point of closing her left eye sometimes  UPDATE January 30 2021: She responded well to previous injection,   Update May 01, 2021: She responded well to previous injection, no significant side effect noted  REVIEW OF SYSTEMS: Full 14 system review of systems performed and notable only for above.  ALLERGIES: No Known Allergies  HOME MEDICATIONS: Current Outpatient Medications  Medication Sig Dispense Refill   alendronate (FOSAMAX) 70 MG tablet Take 70 mg by mouth every 7 (seven) days.      aspirin 325 MG tablet Take 325 mg by mouth daily.     Calcium Carbonate-Vitamin D (CALTRATE 600+D PO) Take 1 capsule by mouth 2 (two) times daily.     gabapentin (NEURONTIN) 100 MG capsule Take 3 capsules (300 mg total) by mouth at bedtime. 90 capsule 11   incobotulinumtoxinA (XEOMIN) 50 units SOLR injection Inject 50 Units into the muscle every 3 (three) months.  lisinopril-hydrochlorothiazide (PRINZIDE,ZESTORETIC) 20-12.5 MG per tablet Take 1 tablet by mouth daily.      omeprazole (PRILOSEC) 20 MG capsule Take 20 mg by mouth every morning.      rOPINIRole (REQUIP) 0.5 MG tablet Take 0.5 mg by mouth 2 (two) times daily.      simvastatin (ZOCOR) 80 MG tablet Take 80 mg by mouth daily at 6 PM.      traZODone (DESYREL) 100 MG tablet Take 100 mg by mouth at bedtime.       No current facility-administered medications for this visit.     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   COLONOSCOPY  2002   TOE SURGERY  1999    FAMILY HISTORY: Family History  Problem Relation Age of Onset   Stroke Mother    Heart attack Father    Breast cancer Neg Hx     SOCIAL HISTORY:  Social History   Socioeconomic History   Marital status: Single    Spouse name: Not on file   Number of children: 3   Years of education: HS   Highest education level: Not on file  Social Needs   Financial resource strain: Not on file   Food insecurity - worry: Not on file   Food insecurity - inability: Not on file   Transportation needs - medical: Not on file   Transportation needs - non-medical: Not on file  Occupational History   Occupation: Retired  Tobacco Use   Smoking status: Never Smoker   Smokeless tobacco: Never Used  Substance and Sexual Activity   Alcohol use: No   Drug use: No   Sexual activity: Not on file  Other Topics Concern   Not on file  Social History Narrative   Lives at home with husband.   Right-handed.   No daily caffeine use.     PHYSICAL EXAM   Vitals:   04/15/17 1350  BP: 124/77  Pulse: 68  Weight: 155 lb (70.3 kg)  Height: 5\' 2"  (1.575 m)   Body mass index is 28.35 kg/m.    She has frequent l left hemifacial spasm, involving left  orbicularis oculi, frequent involvement of left cheek, left frontalis, and the orbicularis auris muscles. Mild asymmetry of left face, droopiness of left face     ASSESSMENT AND PLAN  Patricia Knox is a 85 y.o. female    Left hemifacial spasm Dysport 300 units units in 2.0 cc of NS, we used 0.7 cc, discard 0.3 cc. 1 Left orbicularis oculi at 3, 4, 5, 6, 8, 10   (0.05 ccx6= 0.7 cc) Right orbicularis oculi at  8, 9  (0.05 cc  x 2= 0.10 cc)  Left frontalis muscle 0.05ccx2=0.1cc Right frontalis muscle 0.05 ccx2= 0.1 cc  Left zygomatic major 0.05 cc  Left platysmas 0.05  She will return to clinic in 3 months for repeat injection   Marcial Pacas, M.D. Ph.D.  [facility] [address], [place], [place] [address] Ph: [phone] Fax: [phone]  CC: Patricia Maltese, MD

## 2021-05-01 NOTE — Progress Notes (Signed)
**  Dysport 300 units x 1 vial, NDC [address]-8, Lot [id number], Exp 11/29/2021, specialty pharmacy.//mck,rn**

## 2021-07-21 ENCOUNTER — Other Ambulatory Visit: Payer: Self-pay | Admitting: Neurology

## 2021-07-22 NOTE — Telephone Encounter (Signed)
Rx refilled.

## 2021-08-07 ENCOUNTER — Ambulatory Visit: Payer: Medicare Other | Admitting: Neurology

## 2021-08-09 ENCOUNTER — Other Ambulatory Visit: Payer: Self-pay | Admitting: Internal Medicine

## 2021-08-09 DIAGNOSIS — R7302 Impaired glucose tolerance (oral): Secondary | ICD-10-CM | POA: Diagnosis not present

## 2021-08-09 DIAGNOSIS — Z1231 Encounter for screening mammogram for malignant neoplasm of breast: Secondary | ICD-10-CM

## 2021-08-09 DIAGNOSIS — I10 Essential (primary) hypertension: Secondary | ICD-10-CM | POA: Diagnosis not present

## 2021-08-09 DIAGNOSIS — L03115 Cellulitis of right lower limb: Secondary | ICD-10-CM | POA: Diagnosis not present

## 2021-08-09 DIAGNOSIS — E782 Mixed hyperlipidemia: Secondary | ICD-10-CM | POA: Diagnosis not present

## 2021-08-19 DIAGNOSIS — E782 Mixed hyperlipidemia: Secondary | ICD-10-CM | POA: Diagnosis not present

## 2021-08-19 DIAGNOSIS — I10 Essential (primary) hypertension: Secondary | ICD-10-CM | POA: Diagnosis not present

## 2021-08-19 DIAGNOSIS — R7302 Impaired glucose tolerance (oral): Secondary | ICD-10-CM | POA: Diagnosis not present

## 2021-08-20 ENCOUNTER — Telehealth: Payer: Self-pay | Admitting: Neurology

## 2021-08-20 MED ORDER — DYSPORT 300 UNITS IM SOLR: 300.0000 [IU] | INTRAMUSCULAR | 3 refills | Status: DC

## 2021-08-20 NOTE — Telephone Encounter (Signed)
Please send Dysport Rx refill to [facility]. ?

## 2021-08-20 NOTE — Telephone Encounter (Signed)
Dysport 300 unit vial sent to requested pharmacy. ?

## 2021-09-04 ENCOUNTER — Encounter: Payer: Medicare Other | Attending: Internal Medicine | Admitting: Internal Medicine

## 2021-09-04 DIAGNOSIS — I872 Venous insufficiency (chronic) (peripheral): Secondary | ICD-10-CM | POA: Diagnosis not present

## 2021-09-04 DIAGNOSIS — S80811A Abrasion, right lower leg, initial encounter: Secondary | ICD-10-CM | POA: Diagnosis not present

## 2021-09-04 DIAGNOSIS — I10 Essential (primary) hypertension: Secondary | ICD-10-CM | POA: Insufficient documentation

## 2021-09-04 DIAGNOSIS — L97812 Non-pressure chronic ulcer of other part of right lower leg with fat layer exposed: Secondary | ICD-10-CM | POA: Insufficient documentation

## 2021-09-04 DIAGNOSIS — X58XXXA Exposure to other specified factors, initial encounter: Secondary | ICD-10-CM | POA: Insufficient documentation

## 2021-09-04 NOTE — Progress Notes (Signed)
Patricia Knox, Patricia Knox ([id number]) ?Visit Report for 09/04/2021 ?Abuse Risk Screen Details ?Patient Name: Patricia Knox, Patricia Knox ?Date of Service: 09/04/2021 10:00 AM ?Medical Record Number: [id number] ?Patient Account Number: [id number] ?Date of Birth/Sex: 1935/07/30 (85 y.o. F) ?Treating RN: Donnamarie Poag ?Primary Care Zollie Clemence: Lamonte Sakai Other Clinician: ?Referring Rielly Brunn: Lamonte Sakai ?Treating Shamicka Inga/Extender: Kalman Shan ?Weeks in Treatment: 0 ?Abuse Risk Screen Items ?Answer ?ABUSE RISK SCREEN: ?Has anyone close to you tried to hurt or harm you recentlyo No ?Do you feel uncomfortable with anyone in your familyo No ?Has anyone forced you do things that you didnot want to doo No ?Electronic Signature(s) ?Signed: 09/04/2021 12:01:52 PM By: Donnamarie Poag ?Entered ByDonnamarie Poag on 09/04/2021 12:01:52 ?Patricia Knox, Patricia Knox ([id number]) ?-------------------------------------------------------------------------------- ?Activities of Daily Living Details ?Patient Name: Patricia Knox, Patricia Knox ?Date of Service: 09/04/2021 10:00 AM ?Medical Record Number: [id number] ?Patient Account Number: [id number] ?Date of Birth/Sex: Sep 09, 1935 (85 y.o. F) ?Treating RN: Donnamarie Poag ?Primary Care Banita Lehn: Lamonte Sakai Other Clinician: ?Referring Harrie Cazarez: Lamonte Sakai ?Treating Kattleya Kuhnert/Extender: Kalman Shan ?Weeks in Treatment: 0 ?Activities of Daily Living Items ?Answer ?Activities of Daily Living (Please select one for each item) ?[place] ?Take Medications Completely Able ?Use Telephone Completely Able ?Care for Appearance Completely Able ?Use Toilet Completely Able ?Bath / Shower Completely Able ?Dress Self Completely Able ?Feed Self Completely Able ?Walk Completely Able ?Get In / Out Bed Completely Able ?Housework Completely Able ?Prepare Meals Completely Able ?Handle Money Completely Able ?Shop for Self Completely Able ?Electronic Signature(s) ?Signed: 09/04/2021 12:02:00 PM By: Donnamarie Poag ?Entered ByDonnamarie Poag on 09/04/2021  12:02:00 ?Patricia Knox, Patricia Knox ([id number]) ?-------------------------------------------------------------------------------- ?Education Screening Details ?Patient Name: Patricia Knox, Patricia Knox ?Date of Service: 09/04/2021 10:00 AM ?Medical Record Number: [id number] ?Patient Account Number: [id number] ?Date of Birth/Sex: [birth year] (85 y.o. F) ?Treating RN: Donnamarie Poag ?Primary Care Karryn Kosinski: Lamonte Sakai Other Clinician: ?Referring Purity Irmen: Lamonte Sakai ?Treating Jaasia Viglione/Extender: Kalman Shan ?Weeks in Treatment: 0 ?Primary Learner Assessed: Patient ?Learning Preferences/Education Level/Primary Language ?Learning Preference: Explanation ?Highest Education Level: High School ?Preferred Language: English ?Cognitive Barrier ?Language Barrier: No ?Translator Needed: No ?Memory Deficit: No ?Emotional Barrier: No ?Cultural/Religious Beliefs Affecting Medical Care: No ?Physical Barrier ?Impaired Vision: No ?Impaired Hearing: No ?Decreased Hand dexterity: No ?Knowledge/Comprehension ?Knowledge Level: Medium ?Comprehension Level: High ?Ability to understand written instructions: High ?Ability to understand verbal instructions: High ?Motivation ?Anxiety Level: Calm ?Cooperation: Cooperative ?Education Importance: Acknowledges Need ?Interest in Health Problems: Asks Questions ?Perception: Coherent ?Willingness to Engage in Self-Management ?High ?Activities: ?Readiness to Engage in Self-Management ?High ?Activities: ?Electronic Signature(s) ?Signed: 09/04/2021 12:02:09 PM By: Donnamarie Poag ?Entered ByDonnamarie Poag on 09/04/2021 12:02:09 ?Patricia Knox, Patricia Knox ([id number]) ?-------------------------------------------------------------------------------- ?Fall Risk Assessment Details ?Patient Name: Patricia Knox, Patricia Knox ?Date of Service: 09/04/2021 10:00 AM ?Medical Record Number: [id number] ?Patient Account Number: [id number] ?Date of Birth/Sex: July 13, 1935 (85 y.o. F) ?Treating RN: Donnamarie Poag ?Primary Care Anselmo Reihl: Lamonte Sakai Other Clinician: ?Referring  Montel Vanderhoof: Lamonte Sakai ?Treating Chosen Geske/Extender: Kalman Shan ?Weeks in Treatment: 0 ?Fall Risk Assessment Items ?Have you had 2 or more falls in the last 12 monthso 0 No ?Have you had any fall that resulted in injury in the last 12 monthso 0 No ?FALLS RISK SCREEN ?History of falling - immediate or within 3 months 0 No ?Secondary diagnosis (Do you have 2 or more medical diagnoseso) 0 No ?Ambulatory aid ?None/bed rest/wheelchair/nurse 0 Yes ?Crutches/cane/walker 0 No ?Furniture 0 No ?Intravenous therapy Access/Saline/Heparin Lock 0 No ?Gait/Transferring ?Normal/ bed rest/ wheelchair 0 Yes ?Weak (short steps with or without shuffle, stooped  but able to lift head while walking, may ?0 No ?seek support from furniture) ?Impaired (short steps with shuffle, may have difficulty arising from chair, head down, impaired ?0 No ?balance) ?Mental Status ?Oriented to own ability 0 Yes ?Electronic Signature(s) ?Signed: 09/04/2021 12:02:16 PM By: Donnamarie Poag ?Entered ByDonnamarie Poag on 09/04/2021 12:02:16 ?Patricia Knox, Patricia Knox ([id number]) ?-------------------------------------------------------------------------------- ?Foot Assessment Details ?Patient Name: Patricia Knox, Patricia Knox ?Date of Service: 09/04/2021 10:00 AM ?Medical Record Number: [id number] ?Patient Account Number: [id number] ?Date of Birth/Sex: 1936-01-06 (85 y.o. F) ?Treating RN: Donnamarie Poag ?Primary Care Omarion Minnehan: Lamonte Sakai Other Clinician: ?Referring Nollan Muldrow: Lamonte Sakai ?Treating Roshana Shuffield/Extender: Kalman Shan ?Weeks in Treatment: 0 ?Foot Assessment Items ?Site Locations ?+ = Sensation present, - = Sensation absent, C = Callus, U = Ulcer ?R = Redness, W = Warmth, M = Maceration, PU = Pre-ulcerative lesion ?F = Fissure, S = Swelling, D = Dryness ?Assessment ?Right: Left: ?Other Deformity: No No ?Prior Foot Ulcer: No No ?Prior Amputation: Yes No ?Charcot Joint: No No ?Ambulatory Status: Ambulatory Without Help ?Gait: Steady ?Notes ?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 F) ?Treating RN: Donnamarie Poag ?Primary Care Odis Wickey: Lamonte Sakai Other Clinician: ?Referring Refael Fulop: Lamonte Sakai ?Treating Annalynn Centanni/Extender: Kalman Shan ?Weeks in Treatment: 0 ?Height (in): 62 ?Weight (lbs): 144 ?Body Mass Index (BMI): 26.3 ?Nutrition Risk Screening Items ?Score Screening ?NUTRITION RISK SCREEN: ?I have an illness or condition that made me change the kind and/or amount of food I eat 0 No ?I eat fewer than two meals per day 0 No ?I eat few fruits and vegetables, or milk products 0 No ?I have three or more drinks of beer, liquor or wine almost every day 0 No ?I have tooth or mouth problems that make it hard for me to eat 0 No ?I don't always have enough money to buy the food I need 0 No ?I eat alone most of the time 1 Yes ?I take three or more different prescribed or over-the-counter drugs a day 0 No ?Without wanting to, I have lost or gained 10 pounds in the last six months 0 No ?I am not always physically able to shop, cook and/or feed myself 0 No ?Nutrition Protocols ?Good Risk Protocol 0 No interventions needed ?Moderate Risk Protocol ?High Risk Proctocol ?Risk Level: Good Risk ?Score: 1 ?Electronic Signature(s) ?Signed: 09/04/2021 12:02:24 PM By: Donnamarie Poag ?Entered ByDonnamarie Poag on 09/04/2021 12:02:23 ?

## 2021-09-04 NOTE — Progress Notes (Signed)
Patricia Knox, Patricia Knox ([id number]) ?Visit Report for 09/04/2021 ?Allergy List Details ?Patient Name: Patricia Knox, Patricia Knox ?Date of Service: 09/04/2021 10:00 AM ?Medical Record Number: [id number] ?Patient Account Number: [id number] ?Date of Birth/Sex: Jan 12, 1936 (85 y.o. F) ?Treating RN: Donnamarie Poag ?Primary Care Elber Galyean: Lamonte Sakai Other Clinician: ?Referring Kadelyn Dimascio: Lamonte Sakai ?Treating Arhaan Chesnut/Extender: Kalman Shan ?Weeks in Treatment: 0 ?Allergies ?Active Allergies ?No Known Allergies ?Allergy Notes ?Electronic Signature(s) ?Signed: 09/04/2021 12:01:34 PM By: Donnamarie Poag ?Entered ByDonnamarie Poag on 09/04/2021 12:01:34 ?Patricia Knox, Patricia Knox ([id number]) ?-------------------------------------------------------------------------------- ?Arrival Information Details ?Patient Name: JAYNI, PRESCHER ?Date of Service: 09/04/2021 10:00 AM ?Medical Record Number: [id number] ?Patient Account Number: [id number] ?Date of Birth/Sex: December 09, 1935 (85 y.o. F) ?Treating RN: Donnamarie Poag ?Primary Care Helaine Yackel: Lamonte Sakai Other Clinician: ?Referring Malvern Kadlec: Lamonte Sakai ?Treating Corda Shutt/Extender: Kalman Shan ?Weeks in Treatment: 0 ?Visit Information ?Patient Arrived: Ambulatory ?Arrival Time: 09:43 ?Accompanied By: self ?Transfer Assistance: None ?Patient Identification Verified: Yes ?Secondary Verification Process Completed: Yes ?Patient Requires Transmission-Based No ?Precautions: ?Patient Has Alerts: Yes ?Patient Alerts: Patient on Blood ?Thinner ?NOT diabetic ?ASA '325mg'$  ?Electronic Signature(s) ?Signed: 09/04/2021 12:00:54 PM By: Donnamarie Poag ?Entered ByDonnamarie Poag on 09/04/2021 12:00:53 ?Patricia Knox, Patricia Knox ([id number]) ?-------------------------------------------------------------------------------- ?Clinic Level of Care Assessment Details ?Patient Name: Patricia Knox, Patricia Knox ?Date of Service: 09/04/2021 10:00 AM ?Medical Record Number: [id number] ?Patient Account Number: [id number] ?Date of Birth/Sex: Jun 29, 1935 (85 y.o. F) ?Treating RN: Donnamarie Poag ?Primary Care Bennette Hasty: Lamonte Sakai Other Clinician: ?Referring Letrice Pollok: Lamonte Sakai ?Treating Peregrine Nolt/Extender: Kalman Shan ?Weeks in Treatment: 0 ?Clinic Level of Care Assessment Items ?TOOL 1 Quantity Score ?'[]'$  - Use when EandM and Procedure is performed on INITIAL visit 0 ?ASSESSMENTS - Nursing Assessment / Reassessment ?X - General Physical Exam (combine w/ comprehensive assessment (listed just below) when performed on new ?1 20 ?pt. evals) ?X- 1 25 ?Comprehensive Assessment (HX, ROS, Risk Assessments, Wounds Hx, etc.) ?ASSESSMENTS - Wound and Skin Assessment / Reassessment ?'[]'$  - Dermatologic / Skin Assessment (not related to wound area) 0 ?ASSESSMENTS - Ostomy and/or Continence Assessment and Care ?'[]'$  - Incontinence Assessment and Management 0 ?'[]'$  - 0 ?Ostomy Care Assessment and Management (repouching, etc.) ?PROCESS - Coordination of Care ?X - Simple Patient / Family Education for ongoing care 1 15 ?'[]'$  - 0 ?Complex (extensive) Patient / Family Education for ongoing care ?X- 1 10 ?Staff obtains Consents, Records, Test Results / Process Orders ?'[]'$  - 0 ?Staff telephones HHA, Nursing Homes / Clarify orders / etc ?'[]'$  - 0 ?Routine Transfer to another Facility (non-emergent condition) ?'[]'$  - 0 ?Routine Hospital Admission (non-emergent condition) ?X- 1 15 ?New Admissions / Biomedical engineer / Ordering NPWT, Apligraf, etc. ?'[]'$  - 0 ?Emergency Hospital Admission (emergent condition) ?PROCESS - Special Needs ?'[]'$  - Pediatric / Minor Patient Management 0 ?'[]'$  - 0 ?Isolation Patient Management ?'[]'$  - 0 ?Hearing / Language / Visual special needs ?'[]'$  - 0 ?Assessment of Community assistance (transportation, D/C planning, etc.) ?'[]'$  - 0 ?Additional assistance / Altered mentation ?'[]'$  - 0 ?Support Surface(s) Assessment (bed, cushion, seat, etc.) ?INTERVENTIONS - Miscellaneous ?'[]'$  - External ear exam 0 ?'[]'$  - 0 ?Patient Transfer (multiple staff / Civil Service fast streamer / Similar devices) ?'[]'$  - 0 ?Simple Staple / Suture removal  (25 or less) ?'[]'$  - 0 ?Complex Staple / Suture removal (26 or more) ?'[]'$  - 0 ?Hypo/Hyperglycemic Management (do not check if billed separately) ?X- 1 15 ?Ankle / Brachial Index (ABI) - do not check if billed separately ?Has the patient been seen at the hospital within the last three years:  Yes ?Total Score: 100 ?Level Of Care: New/Established - Level ?3 ?Patricia Knox, Patricia Knox ([id number]) ?Electronic Signature(s) ?Signed: 09/04/2021 12:06:15 PM By: Donnamarie Poag ?Entered ByDonnamarie Poag on 09/04/2021 12:03:53 ?Patricia Knox, Patricia Knox ([id number]) ?-------------------------------------------------------------------------------- ?Encounter Discharge Information Details ?Patient Name: Patricia Knox, Patricia Knox ?Date of Service: 09/04/2021 10:00 AM ?Medical Record Number: [id number] ?Patient Account Number: [id number] ?Date of Birth/Sex: June 28, 1935 (85 y.o. F) ?Treating RN: Donnamarie Poag ?Primary Care Candelario Steppe: Lamonte Sakai Other Clinician: ?Referring Parneet Glantz: Lamonte Sakai ?Treating Clell Trahan/Extender: Kalman Shan ?Weeks in Treatment: 0 ?Encounter Discharge Information Items Post Procedure Vitals ?Discharge Condition: Stable ?Temperature (?F): 97.9 ?Ambulatory Status: Ambulatory ?Pulse (bpm): 66 ?Discharge Destination: Home ?Respiratory Rate (breaths/min): 16 ?Transportation: Private Auto ?Blood Pressure (mmHg): 150/81 ?Accompanied By: self ?Schedule Follow-up Appointment: Yes ?Clinical Summary of Care: ?Electronic Signature(s) ?Signed: 09/04/2021 12:06:03 PM By: Donnamarie Poag ?Entered ByDonnamarie Poag on 09/04/2021 12:06:02 ?Patricia Knox, Patricia Knox ([id number]) ?-------------------------------------------------------------------------------- ?Lower Extremity Assessment Details ?Patient Name: Patricia Knox, Patricia Knox ?Date of Service: 09/04/2021 10:00 AM ?Medical Record Number: [id number] ?Patient Account Number: [id number] ?Date of Birth/Sex: July 04, 1935 (85 y.o. F) ?Treating RN: Donnamarie Poag ?Primary Care Coree Riester: Lamonte Sakai Other Clinician: ?Referring Azizi Bally: Lamonte Sakai ?Treating Tenelle Andreason/Extender: Kalman Shan ?Weeks in Treatment: 0 ?Edema Assessment ?Assessed: [Left: No] [Right: Yes] ?Edema: [Left: Ye] [Right: s] ?Calf ?Left: Right: ?Point of Measurement: 31 cm From Medial Instep 35.8 cm ?Ankle ?Left: Right: ?Point of Measurement: 9 cm From Medial Instep 24 cm ?Knee To Floor ?Left: Right: ?From Medial Instep 42 cm ?Vascular Assessment ?Pulses: ?Dorsalis Pedis ?Palpable: [Right:Yes] ?Blood Pressure: ?Brachial: [Right:118] ?Ankle: ?[Right:Dorsalis Pedis: 122 1.03] ?Electronic Signature(s) ?Signed: 09/04/2021 12:01:25 PM By: Donnamarie Poag ?Entered ByDonnamarie Poag on 09/04/2021 12:01:25 ?Patricia Knox, Patricia Knox ([id number]) ?-------------------------------------------------------------------------------- ?Multi Wound Chart Details ?Patient Name: Patricia Knox, Patricia Knox ?Date of Service: 09/04/2021 10:00 AM ?Medical Record Number: [id number] ?Patient Account Number: [id number] ?Date of Birth/Sex: 1936/04/17 (85 y.o. F) ?Treating RN: Donnamarie Poag ?Primary Care Jeramiah Mccaughey: Lamonte Sakai Other Clinician: ?Referring Cosmo Tetreault: Lamonte Sakai ?Treating Eriel Doyon/Extender: Kalman Shan ?Weeks in Treatment: 0 ?Vital Signs ?Height(in): 62 ?Pulse(bpm): 66 ?Weight(lbs): 144 ?Blood Pressure(mmHg): 150/81 ?Body Mass Index(BMI): 26.3 ?Temperature(??F): 97.9 ?Respiratory Rate(breaths/min): 16 ?Photos: [N/A:N/A] ?Wound Location: Right Lower Leg N/A N/A ?Wounding Event: Skin Tear/Laceration N/A N/A ?Primary Etiology: Skin Tear N/A N/A ?Comorbid History: Cataracts, Angina, Coronary Artery N/A N/A ?Disease ?Date Acquired: 08/05/2021 N/A N/A ?Weeks of Treatment: 0 N/A N/A ?Wound Status: Open N/A N/A ?Wound Recurrence: No N/A N/A ?Measurements L x W x D (cm) 4x3.3x0.1 N/A N/A ?Area (cm?) : 10.367 N/A N/A ?Volume (cm?) : 1.037 N/A N/A ?Classification: Full Thickness Without Exposed N/A N/A ?Support Structures ?Exudate Amount: Medium N/A N/A ?Exudate Type: Serosanguineous N/A N/A ?Exudate Color: red, brown N/A  N/A ?Granulation Amount: None Present (0%) N/A N/A ?Necrotic Amount: Large (67-100%) N/A N/A ?Necrotic Tissue: Eschar, Adherent Slough N/A N/A ?Exposed Structures: ?Fat Layer (Subcutaneous Tissue): N/A N/A ?Yes ?Fascia: No ?T

## 2021-09-06 NOTE — Progress Notes (Signed)
AMRA, SHUKLA ([id number]) ?Visit Report for 09/04/2021 ?Chief Complaint Document Details ?Patient Name: Patricia Knox, Patricia Knox ?Date of Service: 09/04/2021 10:00 AM ?Medical Record Number: [id number] ?Patient Account Number: [id number] ?Date of Birth/Sex: July 08, 1935 (85 y.o. F) ?Treating RN: Donnamarie Poag ?Primary Care Provider: Lamonte Sakai Other Clinician: ?Referring Provider: Lamonte Sakai ?Treating Provider/Extender: Kalman Shan ?Weeks in Treatment: 0 ?Information Obtained from: Patient ?Chief Complaint ?09/04/2021; skin tear to the right lower extremity ?Electronic Signature(s) ?Signed: 09/04/2021 12:04:47 PM By: Donnamarie Poag ?Signed: 09/04/2021 12:16:21 PM By: Kalman Shan DO ?Previous Signature: 09/04/2021 11:08:05 AM Version By: Kalman Shan DO ?Entered ByDonnamarie Poag on 09/04/2021 12:04:46 ?CALLIE, FACEY ([id number]) ?-------------------------------------------------------------------------------- ?Debridement Details ?Patient Name: Patricia Knox ?Date of Service: 09/04/2021 10:00 AM ?Medical Record Number: [id number] ?Patient Account Number: [id number] ?Date of Birth/Sex: 04-10-1936 (85 y.o. F) ?Treating RN: Donnamarie Poag ?Primary Care Provider: Lamonte Sakai Other Clinician: ?Referring Provider: Lamonte Sakai ?Treating Provider/Extender: Jeri Cos ?Weeks in Treatment: 0 ?Debridement Performed for ?Wound #1 Right Lower Leg ?Assessment: ?Performed By: Physician Tommie Sams., PA-C ?Debridement Type: Debridement ?Level of Consciousness (Pre- ?Awake and Alert ?procedure): ?Pre-procedure Verification/Time Out ?Yes - 10:19 ?Taken: ?Start Time: 10:20 ?Pain Control: Lidocaine ?Total Area Debrided (L x W): 4 (cm) x 3.3 (cm) = 13.2 (cm?) ?Tissue and other material ?Viable, Non-Viable, Slough, Subcutaneous, Skin: Dermis , Slough ?debrided: ?Level: Skin/Subcutaneous Tissue ?Debridement Description: Excisional ?Instrument: Curette ?Bleeding: Minimum ?Hemostasis Achieved: Pressure ?Response to Treatment: Procedure was tolerated  well ?Level of Consciousness (Post- ?Awake and Alert ?procedure): ?Post Debridement Measurements of Total Wound ?Length: (cm) 3.2 ?Width: (cm) 2.5 ?Depth: (cm) 0.1 ?Volume: (cm?) 0.628 ?Character of Wound/Ulcer Post Debridement: Improved ?Post Procedure Diagnosis ?Same as Pre-procedure ?Electronic Signature(s) ?Signed: 09/04/2021 12:06:15 PM By: Donnamarie Poag ?Signed: 09/06/2021 5:15:24 PM By: Worthy Keeler PA-C ?Entered ByDonnamarie Poag on 09/04/2021 10:23:18 ?RUCHAMA, KUBICEK ([id number]) ?-------------------------------------------------------------------------------- ?HPI Details ?Patient Name: Patricia Knox ?Date of Service: 09/04/2021 10:00 AM ?Medical Record Number: [id number] ?Patient Account Number: [id number] ?Date of Birth/Sex: September 12, 1935 (85 y.o. F) ?Treating RN: Donnamarie Poag ?Primary Care Provider: Lamonte Sakai Other Clinician: ?Referring Provider: Lamonte Sakai ?Treating Provider/Extender: Kalman Shan ?Weeks in Treatment: 0 ?History of Present Illness ?HPI Description: Admission/09/2021 ?Ms. Nayeliz Spengler is an 86 year old female with a past medical history of essential hypertension that presents to the clinic for a one month history ?of right lower extremity wound after hitting an object. She has not been using any wound dressings and keeping the area open to air. She was ?recently seen by her primary care provider on March 21 and prescribed Augmentin. She completed this course. She currently denies signs of ?infection. She does not wear compression stockings. ?Electronic Signature(s) ?Signed: 09/04/2021 12:04:56 PM By: Donnamarie Poag ?Signed: 09/04/2021 12:16:21 PM By: Kalman Shan DO ?Previous Signature: 09/04/2021 11:08:05 AM Version By: Kalman Shan DO ?Entered ByDonnamarie Poag on 09/04/2021 12:04:56 ?LYSETTE, LINDENBAUM ([id number]) ?-------------------------------------------------------------------------------- ?Physical Exam Details ?Patient Name: Patricia Knox ?Date of Service: 09/04/2021 10:00 AM ?Medical  Record Number: [id number] ?Patient Account Number: [id number] ?Date of Birth/Sex: 06-09-1935 (85 y.o. F) ?Treating RN: Donnamarie Poag ?Primary Care Provider: Lamonte Sakai Other Clinician: ?Referring Provider: Lamonte Sakai ?Treating Provider/Extender: Kalman Shan ?Weeks in Treatment: 0 ?Constitutional ?. ?Cardiovascular ?[name] ?Psychiatric ?[name] ?Notes ?Right lower extremity: To the anterior aspect there is a wound with nonviable tissue throughout. Postdebridement there was granulation tissue ?with some fatty tissue and tightly adhered nonviable tissue. No signs of surrounding infection. ?2+ pitting edema to the knee, varicose veins ?  Electronic Signature(s) ?Signed: 09/04/2021 12:05:06 PM By: Donnamarie Poag ?Signed: 09/04/2021 12:16:21 PM By: Kalman Shan DO ?Previous Signature: 09/04/2021 11:08:05 AM Version By: Kalman Shan DO ?Entered ByDonnamarie Poag on 09/04/2021 12:05:06 ?ISABELLAH, SOBOCINSKI ([id number]) ?-------------------------------------------------------------------------------- ?Physician Orders Details ?Patient Name: Patricia Knox ?Date of Service: 09/04/2021 10:00 AM ?Medical Record Number: [id number] ?Patient Account Number: [id number] ?Date of Birth/Sex: [birth year] (85 y.o. F) ?Treating RN: Donnamarie Poag ?Primary Care Provider: Lamonte Sakai Other Clinician: ?Referring Provider: Lamonte Sakai ?Treating Provider/Extender: Kalman Shan ?Weeks in Treatment: 0 ?Verbal / Phone Orders: No ?Diagnosis Coding ?ICD-10 Coding ?Code Description ?T66.060O Abrasion, right lower leg, initial encounter ?I10 Essential (primary) hypertension ?Follow-up Appointments ?o Return Appointment in 1 week. - in a wrap ?o Nurse Visit as needed ?Bathing/ Shower/ Hygiene ?o May shower with wound dressing protected with water repellent cover or cast protector. ?o No tub bath. ?Anesthetic (Use 'Patient Medications' Section for Anesthetic Order Entry) ?o Lidocaine applied to wound bed ?Edema Control - Lymphedema / Segmental Compressive  Device / Other ?o Elevate leg(s) parallel to the floor when sitting. ?o DO YOUR BEST to sleep in the bed at night. DO NOT sleep in your recliner. Long hours of sitting in a recliner leads to ?swelling of the legs and/or potential wounds on your backside. ?Additional Orders / Instructions ?o Follow Nutritious Diet and Increase Protein Intake ?Wound Treatment ?Wound #1 - Lower Leg Wound Laterality: Right ?Cleanser: Soap and Water 1 x Per Week/15 Days ?Discharge Instructions: Gently cleanse wound with antibacterial soap, rinse and pat dry prior to dressing wounds ?Cleanser: Wound Cleanser 1 x Per Week/15 Days ?Discharge Instructions: Wash your hands with soap and water. Remove old dressing, discard into plastic bag and place into trash. ?Cleanse the wound with Wound Cleanser prior to applying a clean dressing using gauze sponges, not tissues or cotton balls. Do not ?scrub or use excessive force. Pat dry using gauze sponges, not tissue or cotton balls. ?Primary Dressing: Hydrofera Blue Ready Transfer Foam, 2.5x2.5 (in/in) 1 x Per Week/15 Days ?Discharge Instructions: Apply Hydrofera Blue Ready to wound bed as directed ?Secondary Dressing: ABD Pad 5x9 (in/in) 1 x Per Week/15 Days ?Discharge Instructions: Cover with ABD pad ?Secured With: Medipore Tape - 52M Medipore H Soft Cloth Surgical Tape, 2x2 (in/yd) 1 x Per Week/15 Days ?Secured With: Coban Cohesive Bandage 4x5 (yds) Stretched 1 x Per Week/15 Days ?Discharge Instructions: 2nd layer-Apply Coban as directed. ?Secured With: [employer] or Non-Sterile 6-ply 4.5x4 (yd/yd) 1 x Per Week/15 Days ?Discharge Instructions: 1st layer-Apply Kerlix as directed ?Electronic Signature(s) ?Signed: 09/04/2021 12:06:15 PM By: Donnamarie Poag ?Signed: 09/04/2021 12:16:21 PM By: Kalman Shan DO ?Previous Signature: 09/04/2021 11:08:05 AM Version By: Kalman Shan DO ?KORRA, CHRISTINE ([id number]) ?Entered ByDonnamarie Poag on 09/04/2021 12:03:46 ?CASMIRA, CRAMER  ([id number]) ?-------------------------------------------------------------------------------- ?Problem List Details ?Patient Name: BRITANI, BEATTIE ?Date of Service: 09/04/2021 10:00 AM ?Medical Record Number: [id number] ?Pati

## 2021-09-11 ENCOUNTER — Encounter: Payer: Medicare Other | Admitting: Internal Medicine

## 2021-09-11 DIAGNOSIS — I872 Venous insufficiency (chronic) (peripheral): Secondary | ICD-10-CM | POA: Diagnosis not present

## 2021-09-11 DIAGNOSIS — S80811A Abrasion, right lower leg, initial encounter: Secondary | ICD-10-CM | POA: Diagnosis not present

## 2021-09-11 DIAGNOSIS — L97812 Non-pressure chronic ulcer of other part of right lower leg with fat layer exposed: Secondary | ICD-10-CM | POA: Diagnosis not present

## 2021-09-11 DIAGNOSIS — I10 Essential (primary) hypertension: Secondary | ICD-10-CM | POA: Diagnosis not present

## 2021-09-17 NOTE — Progress Notes (Signed)
ONALEE, STEINBACH ([id number]) ?Visit Report for 09/11/2021 ?Debridement Details ?Patient Name: Patricia Knox, DONE ?Date of Service: 09/11/2021 8:00 AM ?Medical Record Number: [id number] ?Patient Account Number: [id number] ?Date of Birth/Sex: 08-20-1935 (85 y.o. F) ?Treating RN: Carlene Coria ?Primary Care Provider: Lamonte Sakai Other Clinician: ?Referring Provider: Lamonte Sakai ?Treating Provider/Extender: Ricard Dillon ?Weeks in Treatment: 1 ?Debridement Performed for ?Wound #1 Right Lower Leg ?Assessment: ?Performed By: Physician Ricard Dillon, MD ?Debridement Type: Debridement ?Level of Consciousness (Pre- ?Awake and Alert ?procedure): ?Pre-procedure Verification/Time Out ?Yes - 08:20 ?Taken: ?Start Time: 08:20 ?Pain Control: Lidocaine 4% Topical Solution ?Total Area Debrided (L x W): 4 (cm) x 2.5 (cm) = 10 (cm?) ?Tissue and other material ?Viable, Non-Viable, Slough, Subcutaneous, Skin: Dermis , Skin: Epidermis, Biofilm, Slough ?debrided: ?Level: Skin/Subcutaneous Tissue ?Debridement Description: Excisional ?Instrument: Curette ?Bleeding: Moderate ?Hemostasis Achieved: Pressure ?End Time: 08:23 ?Procedural Pain: 0 ?Post Procedural Pain: 0 ?Response to Treatment: Procedure was tolerated well ?Level of Consciousness (Post- ?Awake and Alert ?procedure): ?Post Debridement Measurements of Total Wound ?Length: (cm) 4 ?Width: (cm) 2.5 ?Depth: (cm) 0.1 ?Volume: (cm?) 0.785 ?Character of Wound/Ulcer Post Debridement: Improved ?Post Procedure Diagnosis ?Same as Pre-procedure ?Electronic Signature(s) ?Signed: 09/12/2021 7:47:50 AM By: Linton Ham MD ?Signed: 09/17/2021 9:16:22 AM By: Carlene Coria RN ?Entered By: Linton Ham on 09/11/2021 09:73:53 ?CALIANNA, KIM ([id number]) ?-------------------------------------------------------------------------------- ?HPI Details ?Patient Name: Patricia Knox ?Date of Service: 09/11/2021 8:00 AM ?Medical Record Number: [id number] ?Patient Account Number: [id number] ?Date of  Birth/Sex: [birth year] (85 y.o. F) ?Treating RN: Carlene Coria ?Primary Care Provider: Lamonte Sakai Other Clinician: ?Referring Provider: Lamonte Sakai ?Treating Provider/Extender: Ricard Dillon ?Weeks in Treatment: 1 ?History of Present Illness ?HPI Description: Admission/09/2021 ?Ms. Braelynn Dulany is an 86 year old female with a past medical history of essential hypertension that presents to the clinic for a one month history ?of right lower extremity wound after hitting an object. She has not been using any wound dressings and keeping the area open to air. She was ?recently seen by her primary care provider on March 21 and prescribed Augmentin. She completed this course. She currently denies signs of ?infection. She does not wear compression stockings. ?4/12; patient with a traumatic wound on the right anterior mid tibia. Using gentamicin Hydrofera Blue and kerlix Coban. She does not have a prior ?history of wounds however clinically she has very significant venous hypertension with hemosiderin deposition and fragile skin in her lower ?extremities. ?Electronic Signature(s) ?Signed: 09/12/2021 7:47:50 AM By: Linton Ham MD ?Entered By: Linton Ham on 09/11/2021 08:25:32 ?XENA, PROPST ([id number]) ?-------------------------------------------------------------------------------- ?Physical Exam Details ?Patient Name: Patricia Knox ?Date of Service: 09/11/2021 8:00 AM ?Medical Record Number: [id number] ?Patient Account Number: [id number] ?Date of Birth/Sex: 08/15/1935 (85 y.o. F) ?Treating RN: Carlene Coria ?Primary Care Provider: Lamonte Sakai Other Clinician: ?Referring Provider: Lamonte Sakai ?Treating Provider/Extender: Ricard Dillon ?Weeks in Treatment: 1 ?Constitutional ?Sitting or standing Blood Pressure is within target range for patient.. Pulse regular and within target range for patient.[name] Respirations regular, non- ?labored and within target range.. Temperature is normal and within the target range  for the patient.[name] appears in no distress. ?Cardiovascular ?. Significant venous hypertension. ?Notes ?Wound exam; right lower extremity. Anteriorly. Under illumination adherent surface slough removed with a #5 curette subcutaneous debris. This ?really cleans up quite nicely. Around the margins of the wound beginnings of epithelialization. There is no evidence of infection. Her edema control ?is adequate ?Electronic Signature(s) ?Signed: 09/12/2021 7:47:50 AM By: Linton Ham MD ?Entered By: Linton Ham  on 09/11/2021 08:26:47 ?VIBHA, FERDIG ([id number]) ?-------------------------------------------------------------------------------- ?Physician Orders Details ?Patient Name: Knox, Patricia ?Date of Service: 09/11/2021 8:00 AM ?Medical Record Number: [id number] ?Patient Account Number: [id number] ?Date of Birth/Sex: February 19, 1936 (85 y.o. F) ?Treating RN: Carlene Coria ?Primary Care Provider: Lamonte Sakai Other Clinician: ?Referring Provider: Lamonte Sakai ?Treating Provider/Extender: Ricard Dillon ?Weeks in Treatment: 1 ?Verbal / Phone Orders: No ?Diagnosis Coding ?Follow-up Appointments ?o Return Appointment in 1 week. - in a wrap ?o Nurse Visit as needed ?Bathing/ Shower/ Hygiene ?o May shower with wound dressing protected with water repellent cover or cast protector. ?o No tub bath. ?Anesthetic (Use 'Patient Medications' Section for Anesthetic Order Entry) ?o Lidocaine applied to wound bed ?Edema Control - Lymphedema / Segmental Compressive Device / Other ?o Elevate leg(s) parallel to the floor when sitting. ?o DO YOUR BEST to sleep in the bed at night. DO NOT sleep in your recliner. Long hours of sitting in a recliner leads to ?swelling of the legs and/or potential wounds on your backside. ?Additional Orders / Instructions ?o Follow Nutritious Diet and Increase Protein Intake ?Wound Treatment ?Wound #1 - Lower Leg Wound Laterality: Right ?Cleanser: Soap and Water 1 x Per Week/15 Days ?Discharge  Instructions: Gently cleanse wound with antibacterial soap, rinse and pat dry prior to dressing wounds ?Cleanser: Wound Cleanser 1 x Per Week/15 Days ?Discharge Instructions: Wash your hands with soap and water. Remove old dressing, discard into plastic bag and place into trash. ?Cleanse the wound with Wound Cleanser prior to applying a clean dressing using gauze sponges, not tissues or cotton balls. Do not ?scrub or use excessive force. Pat dry using gauze sponges, not tissue or cotton balls. ?Primary Dressing: Hydrofera Blue Ready Transfer Foam, 2.5x2.5 (in/in) 1 x Per Week/15 Days ?Discharge Instructions: Apply Hydrofera Blue Ready to wound bed as directed ?Secondary Dressing: ABD Pad 5x9 (in/in) 1 x Per Week/15 Days ?Discharge Instructions: Cover with ABD pad ?Secured With: Medipore Tape - 74M Medipore H Soft Cloth Surgical Tape, 2x2 (in/yd) 1 x Per Week/15 Days ?Secured With: Coban Cohesive Bandage 4x5 (yds) Stretched 1 x Per Week/15 Days ?Discharge Instructions: 2nd layer-Apply Coban as directed. ?Secured With: [employer] or Non-Sterile 6-ply 4.5x4 (yd/yd) 1 x Per Week/15 Days ?Discharge Instructions: 1st layer-Apply Kerlix as directed ?Electronic Signature(s) ?Signed: 09/12/2021 7:47:50 AM By: Linton Ham MD ?Signed: 09/17/2021 9:16:22 AM By: Carlene Coria RN ?Entered By: Carlene Coria on 09/11/2021 08:22:58 ?MARKELL, SCHRIER ([id number]) ?-------------------------------------------------------------------------------- ?Problem List Details ?Patient Name: HAZELGRACE, BONHAM ?Date of Service: 09/11/2021 8:00 AM ?Medical Record Number: [id number] ?Patient Account Number: [id number] ?Date of Birth/Sex: August 11, 1935 (85 y.o. 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

## 2021-09-17 NOTE — Progress Notes (Signed)
DONELLA, PASCARELLA ([id number]) ?Visit Report for 09/11/2021 ?Arrival Information Details ?Patient Name: Patricia Knox, Patricia Knox ?Date of Service: 09/11/2021 8:00 AM ?Medical Record Number: [id number] ?Patient Account Number: [id number] ?Date of Birth/Sex: 1935-12-02 (85 y.o. F) ?Treating RN: Carlene Coria ?Primary Care Amily Depp: Lamonte Sakai Other Clinician: ?Referring Daimion Adamcik: Lamonte Sakai ?Treating Amiee Wiley/Extender: Ricard Dillon ?Weeks in Treatment: 1 ?Visit Information History Since Last Visit ?All ordered tests and consults were completed: No ?Patient Arrived: Ambulatory ?Added or deleted any medications: No ?Arrival Time: 08:03 ?Any new allergies or adverse reactions: No ?Accompanied By: self ?Had a fall or experienced change in No ?Transfer Assistance: None ?activities of daily living that may affect ?Patient Identification Verified: Yes ?risk of falls: ?Secondary Verification Process Completed: Yes ?Signs or symptoms of abuse/neglect since last visito No ?Patient Requires Transmission-Based No ?Hospitalized since last visit: No ?Precautions: ?Implantable device outside of the clinic excluding No ?Patient Has Alerts: Yes ?cellular tissue based products placed in the center ?Patient Alerts: Patient on Blood ?since last visit: ?Thinner ?Has Dressing in Place as Prescribed: Yes ?NOT diabetic ?Has Compression in Place as Prescribed: Yes ?ASA '325mg'$  ?Pain Present Now: No ?Electronic Signature(s) ?Signed: 09/17/2021 9:16:22 AM By: Carlene Coria RN ?Entered By: Carlene Coria on 09/11/2021 08:08:00 ?Patricia Knox, Patricia Knox ([id number]) ?-------------------------------------------------------------------------------- ?Clinic Level of Care Assessment Details ?Patient Name: Patricia Knox, Patricia Knox ?Date of Service: 09/11/2021 8:00 AM ?Medical Record Number: [id number] ?Patient Account Number: [id number] ?Date of Birth/Sex: Oct 20, 1935 (85 y.o. F) ?Treating RN: Carlene Coria ?Primary Care Rayetta Veith: Lamonte Sakai Other Clinician: ?Referring Dequita Schleicher: Lamonte Sakai ?Treating Yarden Hillis/Extender: Ricard Dillon ?Weeks in Treatment: 1 ?Clinic Level of Care Assessment Items ?TOOL 1 Quantity Score ?'[]'$  - Use when EandM and Procedure is performed on INITIAL visit 0 ?ASSESSMENTS - Nursing Assessment / Reassessment ?'[]'$  - General Physical Exam (combine w/ comprehensive assessment (listed just below) when performed on new ?0 ?pt. evals) ?'[]'$  - 0 ?Comprehensive Assessment (HX, ROS, Risk Assessments, Wounds Hx, etc.) ?ASSESSMENTS - Wound and Skin Assessment / Reassessment ?'[]'$  - Dermatologic / Skin Assessment (not related to wound area) 0 ?ASSESSMENTS - Ostomy and/or Continence Assessment and Care ?'[]'$  - Incontinence Assessment and Management 0 ?'[]'$  - 0 ?Ostomy Care Assessment and Management (repouching, etc.) ?PROCESS - Coordination of Care ?'[]'$  - Simple Patient / Family Education for ongoing care 0 ?'[]'$  - 0 ?Complex (extensive) Patient / Family Education for ongoing care ?'[]'$  - 0 ?Staff obtains Consents, Records, Test Results / Process Orders ?'[]'$  - 0 ?Staff telephones HHA, Nursing Homes / Clarify orders / etc ?'[]'$  - 0 ?Routine Transfer to another Facility (non-emergent condition) ?'[]'$  - 0 ?Routine Hospital Admission (non-emergent condition) ?'[]'$  - 0 ?New Admissions / Biomedical engineer / Ordering NPWT, Apligraf, etc. ?'[]'$  - 0 ?Emergency Hospital Admission (emergent condition) ?PROCESS - Special Needs ?'[]'$  - Pediatric / Minor Patient Management 0 ?'[]'$  - 0 ?Isolation Patient Management ?'[]'$  - 0 ?Hearing / Language / Visual special needs ?'[]'$  - 0 ?Assessment of Community assistance (transportation, D/C planning, etc.) ?'[]'$  - 0 ?Additional assistance / Altered mentation ?'[]'$  - 0 ?Support Surface(s) Assessment (bed, cushion, seat, etc.) ?INTERVENTIONS - Miscellaneous ?'[]'$  - External ear exam 0 ?'[]'$  - 0 ?Patient Transfer (multiple staff / Civil Service fast streamer / Similar devices) ?'[]'$  - 0 ?Simple Staple / Suture removal (25 or less) ?'[]'$  - 0 ?Complex Staple / Suture removal (26 or more) ?'[]'$  -  0 ?Hypo/Hyperglycemic Management (do not check if billed separately) ?'[]'$  - 0 ?Ankle / Brachial Index (ABI) - do not check if billed  separately ?Has the patient been seen at the hospital within the last three years: Yes ?Total Score: 0 ?Level Of Care: ____ ?NAZYIA, GAUGH ([id number]) ?Electronic Signature(s) ?Signed: 09/17/2021 9:16:22 AM By: Carlene Coria RN ?Entered By: Carlene Coria on 09/11/2021 08:23:07 ?Patricia Knox, Patricia Knox ([id number]) ?-------------------------------------------------------------------------------- ?Encounter Discharge Information Details ?Patient Name: Patricia Knox, Patricia Knox ?Date of Service: 09/11/2021 8:00 AM ?Medical Record Number: [id number] ?Patient Account Number: [id number] ?Date of Birth/Sex: [birth year] (85 y.o. F) ?Treating RN: Carlene Coria ?Primary Care Leighanne Adolph: Lamonte Sakai Other Clinician: ?Referring Ferry Matthis: Lamonte Sakai ?Treating Nanetta Wiegman/Extender: Ricard Dillon ?Weeks in Treatment: 1 ?Encounter Discharge Information Items Post Procedure Vitals ?Discharge Condition: Stable ?Temperature (?F): 98.4 ?Ambulatory Status: Ambulatory ?Pulse (bpm): 81 ?Discharge Destination: Home ?Respiratory Rate (breaths/min): 16 ?Transportation: Private Auto ?Blood Pressure (mmHg): 145/81 ?Accompanied By: self ?Schedule Follow-up Appointment: Yes ?Clinical Summary of Care: Patient Declined ?Electronic Signature(s) ?Signed: 09/17/2021 9:16:22 AM By: Carlene Coria RN ?Entered By: Carlene Coria on 09/11/2021 08:46:33 ?Patricia Knox, Patricia Knox ([id number]) ?-------------------------------------------------------------------------------- ?Lower Extremity Assessment Details ?Patient Name: Patricia Knox, Patricia Knox ?Date of Service: 09/11/2021 8:00 AM ?Medical Record Number: [id number] ?Patient Account Number: [id number] ?Date of Birth/Sex: 09/23/1935 (85 y.o. F) ?Treating RN: Carlene Coria ?Primary Care Cassanda Walmer: Lamonte Sakai Other Clinician: ?Referring Geena Weinhold: Lamonte Sakai ?Treating Harutyun Monteverde/Extender: Ricard Dillon ?Weeks in Treatment:  1 ?Edema Assessment ?Assessed: [Left: No] [Right: No] ?Edema: [Left: Ye] [Right: s] ?Calf ?Left: Right: ?Point of Measurement: 31 cm From Medial Instep 33 cm ?Ankle ?Left: Right: ?Point of Measurement: 9 cm From Medial Instep 21 cm ?Knee To Floor ?Left: Right: ?From Medial Instep 42 cm ?Vascular Assessment ?Pulses: ?Dorsalis Pedis ?Palpable: [Right:Yes] ?Electronic Signature(s) ?Signed: 09/17/2021 9:16:22 AM By: Carlene Coria RN ?Entered By: Carlene Coria on 09/11/2021 08:16:41 ?Patricia Knox, Patricia Knox ([id number]) ?-------------------------------------------------------------------------------- ?Multi Wound Chart Details ?Patient Name: Patricia Knox, Patricia Knox ?Date of Service: 09/11/2021 8:00 AM ?Medical Record Number: [id number] ?Patient Account Number: [id number] ?Date of Birth/Sex: January 01, 1936 (85 y.o. F) ?Treating RN: Carlene Coria ?Primary Care Doniven Vanpatten: Lamonte Sakai Other Clinician: ?Referring Suhey Radford: Lamonte Sakai ?Treating Zaakirah Kistner/Extender: Ricard Dillon ?Weeks in Treatment: 1 ?Vital Signs ?Height(in): 62 ?Pulse(bpm): 81 ?Weight(lbs): 144 ?Blood Pressure(mmHg): 145/81 ?Body Mass Index(BMI): 26.3 ?Temperature(??F): 98.4 ?Respiratory Rate(breaths/min): 16 ?Photos: [N/A:N/A] ?Wound Location: Right Lower Leg N/A N/A ?Wounding Event: Skin Tear/Laceration N/A N/A ?Primary Etiology: Skin Tear N/A N/A ?Comorbid History: Cataracts, Angina, Coronary Artery N/A N/A ?Disease, Hypertension, ?Osteoarthritis ?Date Acquired: 08/05/2021 N/A N/A ?Weeks of Treatment: 1 N/A N/A ?Wound Status: Open N/A N/A ?Wound Recurrence: No N/A N/A ?Measurements L x W x D (cm) 4x2.5x0.1 N/A N/A ?Area (cm?) : 7.854 N/A N/A ?Volume (cm?) : 0.785 N/A N/A ?% Reduction in Area: 24.20% N/A N/A ?% Reduction in Volume: 24.30% N/A N/A ?Classification: Full Thickness Without Exposed N/A N/A ?Support Structures ?Exudate Amount: Medium N/A N/A ?Exudate Type: Serosanguineous N/A N/A ?Exudate Color: red, brown N/A N/A ?Granulation Amount: Medium (34-66%) N/A N/A ?Granulation  Quality: Red N/A N/A ?Necrotic Amount: Medium (34-66%) N/A N/A ?Exposed Structures: ?Fat Layer (Subcutaneous Tissue): N/A N/A ?Yes ?Fascia: No ?Tendon: No ?Muscle: No ?Joint: No ?Bone: No ?Epithelialization: Small (1-3

## 2021-09-18 ENCOUNTER — Encounter (HOSPITAL_BASED_OUTPATIENT_CLINIC_OR_DEPARTMENT_OTHER): Payer: Medicare Other | Admitting: Internal Medicine

## 2021-09-18 DIAGNOSIS — S80811A Abrasion, right lower leg, initial encounter: Secondary | ICD-10-CM | POA: Diagnosis not present

## 2021-09-18 DIAGNOSIS — L97812 Non-pressure chronic ulcer of other part of right lower leg with fat layer exposed: Secondary | ICD-10-CM | POA: Diagnosis not present

## 2021-09-18 DIAGNOSIS — I872 Venous insufficiency (chronic) (peripheral): Secondary | ICD-10-CM | POA: Diagnosis not present

## 2021-09-18 DIAGNOSIS — I10 Essential (primary) hypertension: Secondary | ICD-10-CM | POA: Diagnosis not present

## 2021-09-18 NOTE — Progress Notes (Signed)
Patricia Knox, Patricia Knox ([id number]) ?Visit Report for 09/18/2021 ?Arrival Information Details ?Patient Name: Patricia Knox, Patricia Knox ?Date of Service: 09/18/2021 10:30 AM ?Medical Record Number: [id number] ?Patient Account Number: [id number] ?Date of Birth/Sex: [birth year] (85 y.o. F) ?Treating RN: Levora Dredge ?Primary Care Ulanda Tackett: Lamonte Sakai Other Clinician: ?Referring Silvia Markuson: Lamonte Sakai ?Treating Dorotea Hand/Extender: Kalman Shan ?Weeks in Treatment: 2 ?Visit Information History Since Last Visit ?Added or deleted any medications: No ?Patient Arrived: Ambulatory ?Any new allergies or adverse reactions: No ?Arrival Time: 10:49 ?Had a fall or experienced change in No ?Accompanied By: self ?activities of daily living that may affect ?Transfer Assistance: None ?risk of falls: ?Patient Identification Verified: Yes ?Hospitalized since last visit: No ?Secondary Verification Process Completed: Yes ?Has Dressing in Place as Prescribed: Yes ?Patient Requires Transmission-Based No ?Has Compression in Place as Prescribed: Yes ?Precautions: ?Pain Present Now: No ?Patient Has Alerts: Yes ?Patient Alerts: Patient on Blood ?Thinner ?NOT diabetic ?ASA '325mg'$  ?Electronic Signature(s) ?Signed: 09/18/2021 3:51:42 PM By: Levora Dredge ?Entered By: Levora Dredge on 09/18/2021 10:51:11 ?Patricia Knox, Patricia Knox ([id number]) ?-------------------------------------------------------------------------------- ?Clinic Level of Care Assessment Details ?Patient Name: SAPHIRA, LAHMANN ?Date of Service: 09/18/2021 10:30 AM ?Medical Record Number: [id number] ?Patient Account Number: [id number] ?Date of Birth/Sex: 1935-07-01 (85 y.o. F) ?Treating RN: Levora Dredge ?Primary Care Velita Quirk: Lamonte Sakai Other Clinician: ?Referring Jacolyn Joaquin: Lamonte Sakai ?Treating Thereasa Iannello/Extender: Kalman Shan ?Weeks in Treatment: 2 ?Clinic Level of Care Assessment Items ?TOOL 1 Quantity Score ?'[]'$  - Use when EandM and Procedure is performed on INITIAL visit 0 ?ASSESSMENTS -  Nursing Assessment / Reassessment ?'[]'$  - General Physical Exam (combine w/ comprehensive assessment (listed just below) when performed on new ?0 ?pt. evals) ?'[]'$  - 0 ?Comprehensive Assessment (HX, ROS, Risk Assessments, Wounds Hx, etc.) ?ASSESSMENTS - Wound and Skin Assessment / Reassessment ?'[]'$  - Dermatologic / Skin Assessment (not related to wound area) 0 ?ASSESSMENTS - Ostomy and/or Continence Assessment and Care ?'[]'$  - Incontinence Assessment and Management 0 ?'[]'$  - 0 ?Ostomy Care Assessment and Management (repouching, etc.) ?PROCESS - Coordination of Care ?'[]'$  - Simple Patient / Family Education for ongoing care 0 ?'[]'$  - 0 ?Complex (extensive) Patient / Family Education for ongoing care ?'[]'$  - 0 ?Staff obtains Consents, Records, Test Results / Process Orders ?'[]'$  - 0 ?Staff telephones HHA, Nursing Homes / Clarify orders / etc ?'[]'$  - 0 ?Routine Transfer to another Facility (non-emergent condition) ?'[]'$  - 0 ?Routine Hospital Admission (non-emergent condition) ?'[]'$  - 0 ?New Admissions / Biomedical engineer / Ordering NPWT, Apligraf, etc. ?'[]'$  - 0 ?Emergency Hospital Admission (emergent condition) ?PROCESS - Special Needs ?'[]'$  - Pediatric / Minor Patient Management 0 ?'[]'$  - 0 ?Isolation Patient Management ?'[]'$  - 0 ?Hearing / Language / Visual special needs ?'[]'$  - 0 ?Assessment of Community assistance (transportation, D/C planning, etc.) ?'[]'$  - 0 ?Additional assistance / Altered mentation ?'[]'$  - 0 ?Support Surface(s) Assessment (bed, cushion, seat, etc.) ?INTERVENTIONS - Miscellaneous ?'[]'$  - External ear exam 0 ?'[]'$  - 0 ?Patient Transfer (multiple staff / Civil Service fast streamer / Similar devices) ?'[]'$  - 0 ?Simple Staple / Suture removal (25 or less) ?'[]'$  - 0 ?Complex Staple / Suture removal (26 or more) ?'[]'$  - 0 ?Hypo/Hyperglycemic Management (do not check if billed separately) ?'[]'$  - 0 ?Ankle / Brachial Index (ABI) - do not check if billed separately ?Has the patient been seen at the hospital within the last three years: Yes ?Total Score:  0 ?Level Of Care: ____ ?Patricia Knox, Patricia Knox ([id number]) ?Electronic Signature(s) ?Signed: 09/18/2021 3:51:42 PM By: Levora Dredge ?Entered By: Levora Dredge  on 09/18/2021 11:47:25 ?LYNEL, Patricia Knox ([id number]) ?-------------------------------------------------------------------------------- ?Encounter Discharge Information Details ?Patient Name: Patricia Knox, Patricia Knox ?Date of Service: 09/18/2021 10:30 AM ?Medical Record Number: [id number] ?Patient Account Number: [id number] ?Date of Birth/Sex: [birth year] (85 y.o. F) ?Treating RN: Levora Dredge ?Primary Care Kailen Name: Lamonte Sakai Other Clinician: ?Referring Pritika Alvarez: Lamonte Sakai ?Treating Payeton Germani/Extender: Kalman Shan ?Weeks in Treatment: 2 ?Encounter Discharge Information Items Post Procedure Vitals ?Discharge Condition: Stable ?Temperature (?F): 97.6 ?Ambulatory Status: Ambulatory ?Pulse (bpm): 61 ?Discharge Destination: Home ?Respiratory Rate (breaths/min): 18 ?Transportation: Private Auto ?Blood Pressure (mmHg): 133/77 ?Accompanied By: self ?Schedule Follow-up Appointment: Yes ?Clinical Summary of Care: Patient Declined ?Electronic Signature(s) ?Signed: 09/18/2021 11:48:57 AM By: Levora Dredge ?Entered By: Levora Dredge on 09/18/2021 11:48:57 ?Patricia Knox, Patricia Knox ([id number]) ?-------------------------------------------------------------------------------- ?Lower Extremity Assessment Details ?Patient Name: Patricia Knox, Patricia Knox ?Date of Service: 09/18/2021 10:30 AM ?Medical Record Number: [id number] ?Patient Account Number: [id number] ?Date of Birth/Sex: [birth year] (85 y.o. F) ?Treating RN: Levora Dredge ?Primary Care Melina Mosteller: Lamonte Sakai Other Clinician: ?Referring Jewell Ryans: Lamonte Sakai ?Treating Duron Meister/Extender: Kalman Shan ?Weeks in Treatment: 2 ?Edema Assessment ?Assessed: [Left: No] [Right: No] ?Edema: [Left: Ye] [Right: s] ?Calf ?Left: Right: ?Point of Measurement: 31 cm From Medial Instep 34.3 cm ?Ankle ?Left: Right: ?Point of Measurement: 9 cm From Medial  Instep 22 cm ?Vascular Assessment ?Pulses: ?Dorsalis Pedis ?Palpable: [Right:Yes] ?Electronic Signature(s) ?Signed: 09/18/2021 3:51:42 PM By: Levora Dredge ?Entered By: Levora Dredge on 09/18/2021 11:05:42 ?Patricia Knox, Patricia Knox ([id number]) ?-------------------------------------------------------------------------------- ?Multi Wound Chart Details ?Patient Name: Patricia Knox, Patricia Knox ?Date of Service: 09/18/2021 10:30 AM ?Medical Record Number: [id number] ?Patient Account Number: [id number] ?Date of Birth/Sex: May 14, 1936 (85 y.o. F) ?Treating RN: Levora Dredge ?Primary Care Torri Michalski: Lamonte Sakai Other Clinician: ?Referring Jarman Litton: Lamonte Sakai ?Treating Tadeo Besecker/Extender: Kalman Shan ?Weeks in Treatment: 2 ?Vital Signs ?Height(in): 62 ?Pulse(bpm): 61 ?Weight(lbs): 144 ?Blood Pressure(mmHg): 133/77 ?Body Mass Index(BMI): 26.3 ?Temperature(??F): 97.6 ?Respiratory Rate(breaths/min): 18 ?Photos: [N/A:N/A] ?Wound Location: Right Lower Leg N/A N/A ?Wounding Event: Skin Tear/Laceration N/A N/A ?Primary Etiology: Skin Tear N/A N/A ?Comorbid History: Cataracts, Angina, Coronary Artery N/A N/A ?Disease, Hypertension, ?Osteoarthritis ?Date Acquired: 08/05/2021 N/A N/A ?Weeks of Treatment: 2 N/A N/A ?Wound Status: Open N/A N/A ?Wound Recurrence: No N/A N/A ?Measurements Knox x W x D (cm) 2x2.1x0.1 N/A N/A ?Area (cm?) : 3.299 N/A N/A ?Volume (cm?) : 0.33 N/A N/A ?% Reduction in Area: 68.20% N/A N/A ?% Reduction in Volume: 68.20% N/A N/A ?Classification: Full Thickness Without Exposed N/A N/A ?Support Structures ?Exudate Amount: Medium N/A N/A ?Exudate Type: Serosanguineous N/A N/A ?Exudate Color: red, brown N/A N/A ?Granulation Amount: Medium (34-66%) N/A N/A ?Granulation Quality: Red N/A N/A ?Necrotic Amount: Medium (34-66%) N/A N/A ?Exposed Structures: ?Fat Layer (Subcutaneous Tissue): N/A N/A ?Yes ?Fascia: No ?Tendon: No ?Muscle: No ?Joint: No ?Bone: No ?Epithelialization: Small (1-33%) N/A N/A ?Treatment Notes ?Electronic  Signature(s) ?Signed: 09/18/2021 3:51:42 PM By: Levora Dredge ?Entered By: Levora Dredge on 09/18/2021 11:15:01 ?Patricia Knox, Patricia Knox ([id number]) ?---------------------------------------------------------------------------

## 2021-09-18 NOTE — Progress Notes (Signed)
MIGUELINA, FORE ([id number]) ?Visit Report for 09/18/2021 ?Chief Complaint Document Details ?Patient Name: Patricia Knox, Patricia Knox ?Date of Service: 09/18/2021 10:30 AM ?Medical Record Number: [id number] ?Patient Account Number: [id number] ?Date of Birth/Sex: 1935-09-25 (85 y.o. F) ?Treating RN: Levora Dredge ?Primary Care Provider: Lamonte Sakai Other Clinician: ?Referring Provider: Lamonte Sakai ?Treating Provider/Extender: Kalman Shan ?Weeks in Treatment: 2 ?Information Obtained from: Patient ?Chief Complaint ?09/04/2021; skin tear to the right lower extremity ?Electronic Signature(s) ?Signed: 09/18/2021 12:15:46 PM By: Kalman Shan DO ?Entered By: Kalman Shan on 09/18/2021 12:11:13 ?Patricia Knox, Patricia Knox ([id number]) ?-------------------------------------------------------------------------------- ?Debridement Details ?Patient Name: Patricia Knox, Patricia Knox ?Date of Service: 09/18/2021 10:30 AM ?Medical Record Number: [id number] ?Patient Account Number: [id number] ?Date of Birth/Sex: 1935/08/09 (85 y.o. F) ?Treating RN: Levora Dredge ?Primary Care Provider: Lamonte Sakai Other Clinician: ?Referring Provider: Lamonte Sakai ?Treating Provider/Extender: Kalman Shan ?Weeks in Treatment: 2 ?Debridement Performed for ?Wound #1 Right Lower Leg ?Assessment: ?Performed By: Physician Kalman Shan, MD ?Debridement Type: Debridement ?Level of Consciousness (Pre- ?Awake and Alert ?procedure): ?Pre-procedure Verification/Time Out ?Yes - 11:15 ?Taken: ?Total Area Debrided (L x W): 2 (cm) x 2.1 (cm) = 4.2 (cm?) ?Tissue and other material ?Viable, Non-Viable, Slough, Subcutaneous, [place] ?debrided: ?Level: Skin/Subcutaneous Tissue ?Debridement Description: Excisional ?Instrument: Curette ?Bleeding: Moderate ?Hemostasis Achieved: Pressure ?Response to Treatment: Procedure was tolerated well ?Level of Consciousness (Post- ?Awake and Alert ?procedure): ?Post Debridement Measurements of Total Wound ?Length: (cm) 2 ?Width: (cm) 2.1 ?Depth: (cm)  0.1 ?Volume: (cm?) 0.33 ?Character of Wound/Ulcer Post Debridement: Stable ?Post Procedure Diagnosis ?Same as Pre-procedure ?Electronic Signature(s) ?Signed: 09/18/2021 12:15:46 PM By: Kalman Shan DO ?Signed: 09/18/2021 3:51:42 PM By: Levora Dredge ?Entered By: Levora Dredge on 09/18/2021 11:17:15 ?Patricia Knox, Patricia Knox ([id number]) ?-------------------------------------------------------------------------------- ?HPI Details ?Patient Name: Patricia Knox, Patricia Knox ?Date of Service: 09/18/2021 10:30 AM ?Medical Record Number: [id number] ?Patient Account Number: [id number] ?Date of Birth/Sex: 08/11/1935 (85 y.o. F) ?Treating RN: Levora Dredge ?Primary Care Provider: Lamonte Sakai Other Clinician: ?Referring Provider: Lamonte Sakai ?Treating Provider/Extender: Kalman Shan ?Weeks in Treatment: 2 ?History of Present Illness ?HPI Description: Admission/09/2021 ?Ms. Kaliana Deblanc is an 86 year old female with a past medical history of essential hypertension that presents to the clinic for a one month history ?of right lower extremity wound after hitting an object. She has not been using any wound dressings and keeping the area open to air. She was ?recently seen by her primary care provider on March 21 and prescribed Augmentin. She completed this course. She currently denies signs of ?infection. She does not wear compression stockings. ?4/12; patient with a traumatic wound on the right anterior mid tibia. Using gentamicin Hydrofera Blue and kerlix Coban. She does not have a prior ?history of wounds however clinically she has very significant venous hypertension with hemosiderin deposition and fragile skin in her lower ?extremities. ?4/19; patient presents for follow-up. She has no issues or complaints today. She has tolerated the compression wrap well. ?Electronic Signature(s) ?Signed: 09/18/2021 12:15:46 PM By: Kalman Shan DO ?Entered By: Kalman Shan on 09/18/2021 12:11:37 ?Patricia Knox, Patricia Knox  ([id number]) ?-------------------------------------------------------------------------------- ?Physical Exam Details ?Patient Name: Patricia Knox, Patricia Knox ?Date of Service: 09/18/2021 10:30 AM ?Medical Record Number: [id number] ?Patient Account Number: [id number] ?Date of Birth/Sex: Aug 29, 1935 (85 y.o. F) ?Treating RN: Levora Dredge ?Primary Care Provider: Lamonte Sakai Other Clinician: ?Referring Provider: Lamonte Sakai ?Treating Provider/Extender: Kalman Shan ?Weeks in Treatment: 2 ?Constitutional ?. ?Cardiovascular ?[name] ?Psychiatric ?[name] ?Notes ?Right lower extremity: To the anterior aspect there is an open wound with slough, granulation tissue and devitalized tissue. No signs of ?  surrounding infection. ?Electronic Signature(s) ?Signed: 09/18/2021 12:15:46 PM By: Kalman Shan DO ?Entered By: Kalman Shan on 09/18/2021 12:12:20 ?Patricia Knox, Patricia Knox ([id number]) ?-------------------------------------------------------------------------------- ?Physician Orders Details ?Patient Name: Patricia Knox, Patricia Knox ?Date of Service: 09/18/2021 10:30 AM ?Medical Record Number: [id number] ?Patient Account Number: [id number] ?Date of Birth/Sex: Mar 25, 1936 (85 y.o. F) ?Treating RN: Levora Dredge ?Primary Care Provider: Lamonte Sakai Other Clinician: ?Referring Provider: Lamonte Sakai ?Treating Provider/Extender: Kalman Shan ?Weeks in Treatment: 2 ?Verbal / Phone Orders: No ?Diagnosis Coding ?Follow-up Appointments ?o Return Appointment in 1 week. - in a wrap ?o Nurse Visit as needed ?Bathing/ Shower/ Hygiene ?o May shower with wound dressing protected with water repellent cover or cast protector. ?o No tub bath. ?Anesthetic (Use 'Patient Medications' Section for Anesthetic Order Entry) ?o Lidocaine applied to wound bed ?Edema Control - Lymphedema / Segmental Compressive Device / Other ?o Elevate leg(s) parallel to the floor when sitting. ?o DO YOUR BEST to sleep in the bed at night. DO NOT sleep in your recliner. Long hours of  sitting in a recliner leads to ?swelling of the legs and/or potential wounds on your backside. ?Additional Orders / Instructions ?o Follow Nutritious Diet and Increase Protein Intake ?Wound Treatment ?Wound #1 - Lower Leg Wound Laterality: Right ?Cleanser: Soap and Water 1 x Per Week/15 Days ?Discharge Instructions: Gently cleanse wound with antibacterial soap, rinse and pat dry prior to dressing wounds ?Cleanser: Wound Cleanser 1 x Per Week/15 Days ?Discharge Instructions: Wash your hands with soap and water. Remove old dressing, discard into plastic bag and place into trash. ?Cleanse the wound with Wound Cleanser prior to applying a clean dressing using gauze sponges, not tissues or cotton balls. Do not ?scrub or use excessive force. Pat dry using gauze sponges, not tissue or cotton balls. ?Topical: Santyl Collagenase Ointment, 30 (gm), tube 1 x Per Week/15 Days ?Discharge Instructions: apply nickel thick to wound bed only ?Primary Dressing: Hydrofera Blue Ready Transfer Foam, 2.5x2.5 (in/in) 1 x Per Week/15 Days ?Discharge Instructions: Apply Hydrofera Blue Ready to wound bed as directed ?Secondary Dressing: (NON-BORDER) Zetuvit Plus Silicone NON-BORDER 5x5 (in/in) 1 x Per Week/15 Days ?Discharge Instructions: Please do not put silicone bordered dressings under wraps. Use non-bordered dressing only under ?wraps. ?Secured With: Medipore Tape - 23M Medipore H Soft Cloth Surgical Tape, 2x2 (in/yd) 1 x Per Week/15 Days ?Secured With: Coban Cohesive Bandage 4x5 (yds) Stretched 1 x Per Week/15 Days ?Discharge Instructions: 2nd layer-Apply Coban as directed. ?Secured With: [employer] or Non-Sterile 6-ply 4.5x4 (yd/yd) 1 x Per Week/15 Days ?Discharge Instructions: 1st layer-Apply Kerlix as directed ?Electronic Signature(s) ?Signed: 09/18/2021 12:15:46 PM By: Kalman Shan DO ?Entered By: Kalman Shan on 09/18/2021 12:14:08 ?Patricia Knox, Patricia Knox  ([id number]) ?-------------------------------------------------------------------------------- ?Problem List Details ?Patient Name: Patricia Knox, Patricia Knox ?Date of Service: 09/18/2021 10:30 AM ?Medical Record Number: [id number] ?Patient Account Number: [id number] ?Date of Birth/Sex: 01-Mar-1936 (85 y.o. F) ?

## 2021-09-24 ENCOUNTER — Ambulatory Visit: Payer: Medicare Other | Admitting: Neurology

## 2021-09-24 ENCOUNTER — Encounter: Payer: Self-pay | Admitting: Neurology

## 2021-09-24 VITALS — BP 140/88 | HR 86 | Ht 62.0 in | Wt 144.0 lb

## 2021-09-24 DIAGNOSIS — G5132 Clonic hemifacial spasm, left: Secondary | ICD-10-CM

## 2021-09-24 NOTE — Progress Notes (Signed)
Dysport 300 units x 1 vial ?[phone] ?[phone] ?Exp-07/03/2023 ?SP ?

## 2021-09-24 NOTE — Telephone Encounter (Signed)
Received (1) 300 unit vial of Dysport from [facility]. ?

## 2021-09-24 NOTE — Progress Notes (Signed)
? ? ?PATIENT: Patricia Knox  ?DOB: 1935/11/22 ? ?Chief Complaint  ?Patient presents with  ? Hemifacial Spasms  ?  Dysport 300 units x 1 vial - office supply  ?  ? ?HISTORICAL ? ?Patricia Knox is a 86 year old right-handed female, seen in refer by her primary care doctor  [name] for continued EMG guided injection for her left hemifacial spasm. ? 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 ?She began to receive EMG guided incobotulism toxin a by Dr. Sherley Bounds at [facility] works very well for her, last injection was in July 2017,used total 27.5 units, but because of the insurance reason she has to switch to different practice,she hopes to continue injection through our office. ? ?She had a history of restless leg syndrome, complains of frequent bilateral calf muscle cramping, increased recently, woke her up few times each night, despite taking Requip 0.5 milligrams twice a day, ? ?UPDATE Dec 27th 2017: ?She came in for EMG guided xeomin injection for left hemifacial spasm today, potential side effect pain ? ?UPDATE September 25 2016: ?She had suboptimal response to previous injection, noticed left cheek area twitching, no significant side effect noticed, ? ?Gabapentin 100 mg 3 tablets every night has worked well for her nighttime leg cramping. ? ?UPDATE January 07 2017: ? she responded very well to previous injection, no significant side effect noted ? ?UPDATE Apr 15 2017: ? ?She responded very well to previous injection, noticed recurrent left eye twitching, left cheek muscle twitching now ? ?Update July 20, 2017: ?She responded well to previous injection, there was no significant side effect noticed, only recent few weeks she noticed recurrent frequent left eye twitching, involving left cheek as well, ? ?UPDATE Oct 20 2017: ?She responded very well to previous injection ? ?UPDATE January 12 2018: ?She responded well to previous injection,  ? ?UPDATE Apr 14 2018: ?She did very  well with previous injection, this time will use Botox a 100 units ? ?UPDATE Jul 15 2018: ?She did well with previous injection ? ?UPDATE November 10 2018: ?She responded well to previous injections. ? ?UPDATE Oct 25 2019: ?She did well, no significant side effect noted. ? ?UPDATE May 09 2020: ?She had long interview from previous injection, complains of worsening recurrent left hemifacial muscle spasm ? ?UPDATE October 31 2020: ?It has been 6 months since previous injection due to insurance reasons, she has to switch to Dysport 300 units per insurance, previously only used Xeomin 50 units, she not noticed frequent left hemifacial muscle spasm to the point of closing her left eye sometimes ? ?UPDATE January 30 2021: ?She responded well to previous injection,  ? ?Update May 01, 2021: ?She responded well to previous injection, no significant side effect noted ? ?UPDATE September 24 2021: ?She responded well to injection, did notice facial asymmetry, low-lying left nasolabial fold ? ? ?PHYSICAL EXAM ?  ?Vitals:  ? 04/15/17 1350  ?BP: 124/77  ?Pulse: 68  ?Weight: 155 lb (70.3 kg)  ?Height: '5\' 2"'$  (1.575 m)  ? ?Body mass index is 28.35 kg/m?. ?   ?She has frequent l left hemifacial spasm, involving left orbicularis oculi, frequent involvement of left cheek, left frontalis, and the orbicularis auris muscles. Mild asymmetry of left face, droopiness of left face ?  ?  ?ASSESSMENT AND PLAN ? ?Patricia Knox is a 86 y.o. female   ? ?Left hemifacial spasm ?Dysport 300 units units in 2.5 cc of NS, we used  1.15, discard the rest. ? ?Left orbicularis oculi at 3, 4, 5, 6, 8, 10   (0.05 ccx6= 0.7 cc) ?Right orbicularis oculi at  8, 9, 7 (0.05 cc  x 3= 0.15 cc) ?   ?Left zygomatic major 0.05 cc x2=0.1 cc ?Left platysmas 0.05 x4=0.2 cc ? ?She will return to clinic in 3 months for repeat injection ? ? ?Marcial Pacas, M.D. Ph.D. ? ?[facility] ?[address], [address] ?[place], [place] [address] ?Ph: [phone] ?Fax:  [phone] ? ?CC: [name], MD ?

## 2021-09-25 ENCOUNTER — Encounter (HOSPITAL_BASED_OUTPATIENT_CLINIC_OR_DEPARTMENT_OTHER): Payer: Medicare Other | Admitting: Internal Medicine

## 2021-09-25 DIAGNOSIS — I10 Essential (primary) hypertension: Secondary | ICD-10-CM | POA: Diagnosis not present

## 2021-09-25 DIAGNOSIS — S80811A Abrasion, right lower leg, initial encounter: Secondary | ICD-10-CM | POA: Diagnosis not present

## 2021-09-25 DIAGNOSIS — L97812 Non-pressure chronic ulcer of other part of right lower leg with fat layer exposed: Secondary | ICD-10-CM | POA: Diagnosis not present

## 2021-09-25 DIAGNOSIS — I872 Venous insufficiency (chronic) (peripheral): Secondary | ICD-10-CM

## 2021-09-25 NOTE — Progress Notes (Signed)
Patricia, Knox ([id number]) ?Visit Report for 09/25/2021 ?Chief Complaint Document Details ?Patient Name: Patricia Knox, Patricia Knox ?Date of Service: 09/25/2021 9:00 AM ?Medical Record Number: [id number] ?Patient Account Number: [id number] ?Date of Birth/Sex: [birth year] (86 y.o. F) ?Treating RN: Levora Dredge ?Primary Care Provider: Lamonte Sakai Other Clinician: ?Referring Provider: Lamonte Sakai ?Treating Provider/Extender: Kalman Shan ?Weeks in Treatment: 3 ?Information Obtained from: Patient ?Chief Complaint ?09/04/2021; skin tear to the right lower extremity ?Electronic Signature(s) ?Signed: 09/25/2021 10:27:12 AM By: Kalman Shan DO ?Entered By: Kalman Shan on 09/25/2021 09:44:17 ?HAYLEN, BELLOTTI ([id number]) ?-------------------------------------------------------------------------------- ?HPI Details ?Patient Name: Patricia, Knox ?Date of Service: 09/25/2021 9:00 AM ?Medical Record Number: [id number] ?Patient Account Number: [id number] ?Date of Birth/Sex: [birth year] (86 y.o. F) ?Treating RN: Levora Dredge ?Primary Care Provider: Lamonte Sakai Other Clinician: ?Referring Provider: Lamonte Sakai ?Treating Provider/Extender: Kalman Shan ?Weeks in Treatment: 3 ?History of Present Illness ?HPI Description: Admission/09/2021 ?Ms. Maicee Ellerbrock is an 86 year old female with a past medical history of essential hypertension that presents to the clinic for a one month history ?of right lower extremity wound after hitting an object. She has not been using any wound dressings and keeping the area open to air. She was ?recently seen by her primary care provider on March 21 and prescribed Augmentin. She completed this course. She currently denies signs of ?infection. She does not wear compression stockings. ?4/12; patient with a traumatic wound on the right anterior mid tibia. Using gentamicin Hydrofera Blue and kerlix Coban. She does not have a prior ?history of wounds however clinically she has very significant venous  hypertension with hemosiderin deposition and fragile skin in her lower ?extremities. ?4/19; patient presents for follow-up. She has no issues or complaints today. She has tolerated the compression wrap well. ?4/26; patient presents for follow-up. She continues to tolerate the wrap well. She has no issues or complaints today. ?Electronic Signature(s) ?Signed: 09/25/2021 10:27:12 AM By: Kalman Shan DO ?Entered By: Kalman Shan on 09/25/2021 09:45:24 ?ICESIS, RENN ([id number]) ?-------------------------------------------------------------------------------- ?Physical Exam Details ?Patient Name: Patricia, Knox ?Date of Service: 09/25/2021 9:00 AM ?Medical Record Number: [id number] ?Patient Account Number: [id number] ?Date of Birth/Sex: 1935-08-22 (86 y.o. F) ?Treating RN: Levora Dredge ?Primary Care Provider: Lamonte Sakai Other Clinician: ?Referring Provider: Lamonte Sakai ?Treating Provider/Extender: Kalman Shan ?Weeks in Treatment: 3 ?Constitutional ?. ?Cardiovascular ?[name] ?Psychiatric ?[name] ?Notes ?Right lower extremity: To the anterior aspect there is an open wound with granulation tissue throughout. No signs of surrounding infection. ?Appears well-healing. ?Electronic Signature(s) ?Signed: 09/25/2021 10:27:12 AM By: Kalman Shan DO ?Entered By: Kalman Shan on 09/25/2021 09:46:21 ?REEMA, CHICK ([id number]) ?-------------------------------------------------------------------------------- ?Physician Orders Details ?Patient Name: [name], Knox ?Date of Service: 09/25/2021 9:00 AM ?Medical Record Number: [id number] ?Patient Account Number: [id number] ?Date of Birth/Sex: June 13, 1935 (86 y.o. F) ?Treating RN: Levora Dredge ?Primary Care Provider: Lamonte Sakai Other Clinician: ?Referring Provider: Lamonte Sakai ?Treating Provider/Extender: Kalman Shan ?Weeks in Treatment: 3 ?Verbal / Phone Orders: No ?Diagnosis Coding ?Follow-up Appointments ?o Return Appointment in 1 week. - in a wrap ?o Nurse  Visit as needed ?Bathing/ Shower/ Hygiene ?o May shower with wound dressing protected with water repellent cover or cast protector. ?o No tub bath. ?Anesthetic (Use 'Patient Medications' Section for Anesthetic Order Entry) ?o Lidocaine applied to wound bed ?Edema Control - Lymphedema / Segmental Compressive Device / Other ?o Elevate leg(s) parallel to the floor when sitting. ?o DO YOUR BEST to sleep in the bed at night. DO NOT sleep in your recliner. Long hours of sitting in  a recliner leads to ?swelling of the legs and/or potential wounds on your backside. ?Additional Orders / Instructions ?o Follow Nutritious Diet and Increase Protein Intake ?Wound Treatment ?Wound #1 - Lower Leg Wound Laterality: Right ?Cleanser: Soap and Water 1 x Per Week/15 Days ?Discharge Instructions: Gently cleanse wound with antibacterial soap, rinse and pat dry prior to dressing wounds ?Cleanser: Wound Cleanser 1 x Per Week/15 Days ?Discharge Instructions: Wash your hands with soap and water. Remove old dressing, discard into plastic bag and place into trash. ?Cleanse the wound with Wound Cleanser prior to applying a clean dressing using gauze sponges, not tissues or cotton balls. Do not ?scrub or use excessive force. Pat dry using gauze sponges, not tissue or cotton balls. ?Topical: Santyl Collagenase Ointment, 30 (gm), tube 1 x Per Week/15 Days ?Discharge Instructions: apply nickel thick to wound bed only ?Primary Dressing: Hydrofera Blue Ready Transfer Foam, 2.5x2.5 (in/in) 1 x Per Week/15 Days ?Discharge Instructions: Apply Hydrofera Blue Ready to wound bed as directed ?Secondary Dressing: (NON-BORDER) Zetuvit Plus Silicone NON-BORDER 5x5 (in/in) 1 x Per Week/15 Days ?Discharge Instructions: Please do not put silicone bordered dressings under wraps. Use non-bordered dressing only under ?wraps. ?Secured With: Medipore Tape - 38M Medipore H Soft Cloth Surgical Tape, 2x2 (in/yd) 1 x Per Week/15 Days ?Secured With: Coban  Cohesive Bandage 4x5 (yds) Stretched 1 x Per Week/15 Days ?Discharge Instructions: 2nd layer-Apply Coban as directed. ?Secured With: [employer] or Non-Sterile 6-ply 4.5x4 (yd/yd) 1 x Per Week/15 Days ?Discharge Instructions: 1st layer-Apply Kerlix as directed ?Electronic Signature(s) ?Signed: 09/25/2021 10:27:12 AM By: Kalman Shan DO ?Entered By: Kalman Shan on 09/25/2021 10:26:39 ?HIDEKO, ESSELMAN ([id number]) ?-------------------------------------------------------------------------------- ?Problem List Details ?Patient Name: ELLYSSA, ZAGAL ?Date of Service: 09/25/2021 9:00 AM ?Medical Record Number: [id number] ?Patient Account Number: [id number] ?Date of Birth/Sex: 1935/06/05 (86 y.o. 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 F) ?Treating RN: Levora Dredge ?Primary Care Provider: Lamonte Sakai Other Clinician: ?Referring Provider: Lamonte Sakai ?Treating Provider/Extender: Kalman Shan ?Weeks in  Treatment: 3 ?Subjective ?Chief Complaint ?Information obtained from Patient ?09/04/2021; skin tear to the right lower extremity ?History of Present Illness (HPI) ?Admission/09/2021 ?Ms. Shandel Crisanti is an 86 year old female

## 2021-09-25 NOTE — Progress Notes (Signed)
Patricia Knox ([id number]) ?Visit Report for 09/25/2021 ?Arrival Information Details ?Patient Name: Patricia Knox, Patricia Knox ?Date of Service: 09/25/2021 9:00 AM ?Medical Record Number: [id number] ?Patient Account Number: [id number] ?Date of Birth/Sex: 1935-11-05 (85 y.o. F) ?Treating RN: Levora Dredge ?Primary Care Josuha Fontanez: Lamonte Sakai Other Clinician: ?Referring Rashea Hoskie: Lamonte Sakai ?Treating Wealthy Danielski/Extender: Kalman Shan ?Weeks in Treatment: 3 ?Visit Information History Since Last Visit ?Added or deleted any medications: No ?Patient Arrived: Ambulatory ?Any new allergies or adverse reactions: No ?Arrival Time: 09:03 ?Had a fall or experienced change in No ?Accompanied By: self ?activities of daily living that may affect ?Transfer Assistance: None ?risk of falls: ?Patient Identification Verified: Yes ?Hospitalized since last visit: No ?Secondary Verification Process Completed: Yes ?Has Dressing in Place as Prescribed: Yes ?Patient Requires Transmission-Based No ?Has Compression in Place as Prescribed: Yes ?Precautions: ?Pain Present Now: Yes ?Patient Has Alerts: Yes ?Patient Alerts: Patient on Blood ?Thinner ?NOT diabetic ?ASA '325mg'$  ?Electronic Signature(s) ?Signed: 09/25/2021 4:30:13 PM By: Levora Dredge ?Entered By: Levora Dredge on 09/25/2021 09:06:01 ?Patricia Knox, Patricia Knox ([id number]) ?-------------------------------------------------------------------------------- ?Clinic Level of Care Assessment Details ?Patient Name: Patricia Knox ?Date of Service: 09/25/2021 9:00 AM ?Medical Record Number: [id number] ?Patient Account Number: [id number] ?Date of Birth/Sex: 1935-07-16 (85 y.o. F) ?Treating RN: Levora Dredge ?Primary Care Emoni Yang: Lamonte Sakai Other Clinician: ?Referring Brittanie Dosanjh: Lamonte Sakai ?Treating Elisabella Hacker/Extender: Kalman Shan ?Weeks in Treatment: 3 ?Clinic Level of Care Assessment Items ?TOOL 4 Quantity Score ?'[]'$  - Use when only an EandM is performed on FOLLOW-UP visit 0 ?ASSESSMENTS - Nursing  Assessment / Reassessment ?X - Reassessment of Co-morbidities (includes updates in patient status) 1 10 ?X- 1 5 ?Reassessment of Adherence to Treatment Plan ?ASSESSMENTS - Wound and Skin Assessment / Reassessment ?X - Simple Wound Assessment / Reassessment - one wound 1 5 ?'[]'$  - 0 ?Complex Wound Assessment / Reassessment - multiple wounds ?'[]'$  - 0 ?Dermatologic / Skin Assessment (not related to wound area) ?ASSESSMENTS - Focused Assessment ?X - Circumferential Edema Measurements - multi extremities 1 5 ?'[]'$  - 0 ?Nutritional Assessment / Counseling / Intervention ?'[]'$  - 0 ?Lower Extremity Assessment (monofilament, tuning fork, pulses) ?'[]'$  - 0 ?Peripheral Arterial Disease Assessment (using hand held doppler) ?ASSESSMENTS - Ostomy and/or Continence Assessment and Care ?'[]'$  - Incontinence Assessment and Management 0 ?'[]'$  - 0 ?Ostomy Care Assessment and Management (repouching, etc.) ?PROCESS - Coordination of Care ?X - Simple Patient / Family Education for ongoing care 1 15 ?'[]'$  - 0 ?Complex (extensive) Patient / Family Education for ongoing care ?'[]'$  - 0 ?Staff obtains Consents, Records, Test Results / Process Orders ?'[]'$  - 0 ?Staff telephones HHA, Nursing Homes / Clarify orders / etc ?'[]'$  - 0 ?Routine Transfer to another Facility (non-emergent condition) ?'[]'$  - 0 ?Routine Hospital Admission (non-emergent condition) ?'[]'$  - 0 ?New Admissions / Biomedical engineer / Ordering NPWT, Apligraf, etc. ?'[]'$  - 0 ?Emergency Hospital Admission (emergent condition) ?X- 1 10 ?Simple Discharge Coordination ?'[]'$  - 0 ?Complex (extensive) Discharge Coordination ?PROCESS - Special Needs ?'[]'$  - Pediatric / Minor Patient Management 0 ?'[]'$  - 0 ?Isolation Patient Management ?'[]'$  - 0 ?Hearing / Language / Visual special needs ?'[]'$  - 0 ?Assessment of Community assistance (transportation, D/C planning, etc.) ?'[]'$  - 0 ?Additional assistance / Altered mentation ?'[]'$  - 0 ?Support Surface(s) Assessment (bed, cushion, seat, etc.) ?INTERVENTIONS - Wound Cleansing /  Measurement ?Patricia Knox, Patricia Knox ([id number]) ?X- 1 5 ?Simple Wound Cleansing - one wound ?'[]'$  - 0 ?Complex Wound Cleansing - multiple wounds ?X- 1 5 ?Wound Imaging (photographs - any number  of wounds) ?'[]'$  - 0 ?Wound Tracing (instead of photographs) ?X- 1 5 ?Simple Wound Measurement - one wound ?'[]'$  - 0 ?Complex Wound Measurement - multiple wounds ?INTERVENTIONS - Wound Dressings ?X - Small Wound Dressing one or multiple wounds 1 10 ?'[]'$  - 0 ?Medium Wound Dressing one or multiple wounds ?'[]'$  - 0 ?Large Wound Dressing one or multiple wounds ?X- 1 5 ?Application of Medications - topical ?'[]'$  - 0 ?Application of Medications - injection ?INTERVENTIONS - Miscellaneous ?'[]'$  - External ear exam 0 ?'[]'$  - 0 ?Specimen Collection (cultures, biopsies, blood, body fluids, etc.) ?'[]'$  - 0 ?Specimen(s) / Culture(s) sent or taken to Lab for analysis ?'[]'$  - 0 ?Patient Transfer (multiple staff / Civil Service fast streamer / Similar devices) ?'[]'$  - 0 ?Simple Staple / Suture removal (25 or less) ?'[]'$  - 0 ?Complex Staple / Suture removal (26 or more) ?'[]'$  - 0 ?Hypo / Hyperglycemic Management (close monitor of Blood Glucose) ?'[]'$  - 0 ?Ankle / Brachial Index (ABI) - do not check if billed separately ?X- 1 5 ?Vital Signs ?Has the patient been seen at the hospital within the last three years: Yes ?Total Score: 85 ?Level Of Care: New/Established - Level ?3 ?Electronic Signature(s) ?Signed: 09/25/2021 4:30:13 PM By: Levora Dredge ?Entered By: Levora Dredge on 09/25/2021 11:02:03 ?Patricia Knox, Patricia Knox ([id number]) ?-------------------------------------------------------------------------------- ?Encounter Discharge Information Details ?Patient Name: Patricia Knox, Patricia Knox ?Date of Service: 09/25/2021 9:00 AM ?Medical Record Number: [id number] ?Patient Account Number: [id number] ?Date of Birth/Sex: November 12, 1935 (85 y.o. F) ?Treating RN: Levora Dredge ?Primary Care Clydell Sposito: Lamonte Sakai Other Clinician: ?Referring Traniyah Hallett: Lamonte Sakai ?Treating Indigo Barbian/Extender: Kalman Shan ?Weeks in  Treatment: 3 ?Encounter Discharge Information Items ?Discharge Condition: Stable ?Ambulatory Status: Ambulatory ?Discharge Destination: Home ?Transportation: Private Auto ?Accompanied By: self ?Schedule Follow-up Appointment: Yes ?Clinical Summary of Care: Patient Declined ?Electronic Signature(s) ?Signed: 09/25/2021 11:03:51 AM By: Levora Dredge ?Entered By: Levora Dredge on 09/25/2021 11:03:51 ?Patricia Knox, Patricia Knox ([id number]) ?-------------------------------------------------------------------------------- ?Lower Extremity Assessment Details ?Patient Name: Patricia Knox, Patricia Knox ?Date of Service: 09/25/2021 9:00 AM ?Medical Record Number: [id number] ?Patient Account Number: [id number] ?Date of Birth/Sex: [birth year] (85 y.o. F) ?Treating RN: Levora Dredge ?Primary Care Quinnetta Roepke: Lamonte Sakai Other Clinician: ?Referring Serenah Mill: Lamonte Sakai ?Treating Deondray Ospina/Extender: Kalman Shan ?Weeks in Treatment: 3 ?Edema Assessment ?Assessed: [Left: No] [Right: No] ?Edema: [Left: Ye] [Right: s] ?Calf ?Left: Right: ?Point of Measurement: 31 cm From Medial Instep 34 cm ?Ankle ?Left: Right: ?Point of Measurement: 9 cm From Medial Instep 21.2 cm ?Vascular Assessment ?Pulses: ?Dorsalis Pedis ?Palpable: [Left:Yes] ?Electronic Signature(s) ?Signed: 09/25/2021 4:30:13 PM By: Levora Dredge ?Entered By: Levora Dredge on 09/25/2021 09:20:29 ?Patricia Knox, Patricia Knox ([id number]) ?-------------------------------------------------------------------------------- ?Multi Wound Chart Details ?Patient Name: Patricia Knox, Patricia Knox ?Date of Service: 09/25/2021 9:00 AM ?Medical Record Number: [id number] ?Patient Account Number: [id number] ?Date of Birth/Sex: [birth year] (85 y.o. F) ?Treating RN: Levora Dredge ?Primary Care Tadarrius Burch: Lamonte Sakai Other Clinician: ?Referring Tameria Patti: Lamonte Sakai ?Treating Kmya Placide/Extender: Kalman Shan ?Weeks in Treatment: 3 ?Vital Signs ?Height(in): 62 ?Pulse(bpm): 71 ?Weight(lbs): 144 ?Blood Pressure(mmHg): 127/79 ?Body Mass  Index(BMI): 26.3 ?Temperature(??F): 97.7 ?Respiratory Rate(breaths/min): 18 ?Photos: [N/A:N/A] ?Wound Location: Right Lower Leg N/A N/A ?Wounding Event: Skin Tear/Laceration N/A N/A ?Primary Etiology:

## 2021-10-02 ENCOUNTER — Encounter: Payer: Medicare Other | Attending: Internal Medicine | Admitting: Internal Medicine

## 2021-10-02 DIAGNOSIS — S80811A Abrasion, right lower leg, initial encounter: Secondary | ICD-10-CM | POA: Diagnosis not present

## 2021-10-02 DIAGNOSIS — W228XXA Striking against or struck by other objects, initial encounter: Secondary | ICD-10-CM | POA: Diagnosis not present

## 2021-10-02 DIAGNOSIS — I878 Other specified disorders of veins: Secondary | ICD-10-CM | POA: Diagnosis not present

## 2021-10-02 DIAGNOSIS — I872 Venous insufficiency (chronic) (peripheral): Secondary | ICD-10-CM | POA: Diagnosis not present

## 2021-10-02 DIAGNOSIS — L97812 Non-pressure chronic ulcer of other part of right lower leg with fat layer exposed: Secondary | ICD-10-CM | POA: Diagnosis not present

## 2021-10-02 DIAGNOSIS — I10 Essential (primary) hypertension: Secondary | ICD-10-CM | POA: Diagnosis not present

## 2021-10-03 NOTE — Progress Notes (Signed)
Patricia Knox, Patricia Knox ([id number]) ?Visit Report for 10/02/2021 ?Chief Complaint Document Details ?Patient Name: Patricia Knox, Patricia Knox ?Date of Service: 10/02/2021 8:00 AM ?Medical Record Number: [id number] ?Patient Account Number: [id number] ?Date of Birth/Sex: [birth year] (86 y.o. F) ?Treating RN: Cornell Barman ?Primary Care Provider: Lamonte Sakai Other Clinician: ?Referring Provider: Lamonte Sakai ?Treating Provider/Extender: Kalman Shan ?Weeks in Treatment: 4 ?Information Obtained from: Patient ?Chief Complaint ?09/04/2021; skin tear to the right lower extremity ?Electronic Signature(s) ?Signed: 10/02/2021 9:44:52 AM By: Kalman Shan DO ?Entered By: Kalman Shan on 10/02/2021 08:55:33 ?[name], Knox ([id number]) ?-------------------------------------------------------------------------------- ?HPI Details ?Patient Name: Patricia Knox, Patricia Knox ?Date of Service: 10/02/2021 8:00 AM ?Medical Record Number: [id number] ?Patient Account Number: [id number] ?Date of Birth/Sex: December 19, 1935 (86 y.o. F) ?Treating RN: Cornell Barman ?Primary Care Provider: Lamonte Sakai Other Clinician: ?Referring Provider: Lamonte Sakai ?Treating Provider/Extender: Kalman Shan ?Weeks in Treatment: 4 ?History of Present Illness ?HPI Description: Admission/09/2021 ?Ms. Patricia Knox is an 86 year old female with a past medical history of essential hypertension that presents to the clinic for a one month history ?of right lower extremity wound after hitting an object. She has not been using any wound dressings and keeping the area open to air. She was ?recently seen by her primary care provider on March 21 and prescribed Augmentin. She completed this course. She currently denies signs of ?infection. She does not wear compression stockings. ?4/12; patient with a traumatic wound on the right anterior mid tibia. Using gentamicin Hydrofera Blue and kerlix Coban. She does not have a prior ?history of wounds however clinically she has very significant venous hypertension with  hemosiderin deposition and fragile skin in her lower ?extremities. ?4/19; patient presents for follow-up. She has no issues or complaints today. She has tolerated the compression wrap well. ?4/26; patient presents for follow-up. She continues to tolerate the wrap well. She has no issues or complaints today. ?5/3; patient presents for follow-up. She has no issues or complaints today. There is improvement in wound healing. ?Electronic Signature(s) ?Signed: 10/02/2021 9:44:52 AM By: Kalman Shan DO ?Entered By: Kalman Shan on 10/02/2021 08:56:31 ?Patricia, Knox ([id number]) ?-------------------------------------------------------------------------------- ?Physical Exam Details ?Patient Name: Patricia Knox, Patricia Knox ?Date of Service: 10/02/2021 8:00 AM ?Medical Record Number: [id number] ?Patient Account Number: [id number] ?Date of Birth/Sex: 1935/11/06 (86 y.o. F) ?Treating RN: Cornell Barman ?Primary Care Provider: Lamonte Sakai Other Clinician: ?Referring Provider: Lamonte Sakai ?Treating Provider/Extender: Kalman Shan ?Weeks in Treatment: 4 ?Constitutional ?. ?Cardiovascular ?[name] ?Psychiatric ?[name] ?Notes ?Right lower extremity: To the anterior aspect there are 2 small areas that are open with granulation tissue and some scant nonviable tissue. No ?surrounding signs of infection. ?Electronic Signature(s) ?Signed: 10/02/2021 9:44:52 AM By: Kalman Shan DO ?Entered By: Kalman Shan on 10/02/2021 08:57:08 ?Patricia Knox, Patricia Knox ([id number]) ?-------------------------------------------------------------------------------- ?Physician Orders Details ?Patient Name: Patricia Knox, Patricia Knox ?Date of Service: 10/02/2021 8:00 AM ?Medical Record Number: [id number] ?Patient Account Number: [id number] ?Date of Birth/Sex: 11-Jun-1935 (86 y.o. F) ?Treating RN: Cornell Barman ?Primary Care Provider: Lamonte Sakai Other Clinician: ?Referring Provider: Lamonte Sakai ?Treating Provider/Extender: Kalman Shan ?Weeks in Treatment: 4 ?Verbal / Phone Orders:  No ?Diagnosis Coding ?Follow-up Appointments ?o Return Appointment in 1 week. - in a wrap ?o Nurse Visit as needed ?Bathing/ Shower/ Hygiene ?o May shower with wound dressing protected with water repellent cover or cast protector. ?o No tub bath. ?Edema Control - Lymphedema / Segmental Compressive Device / Other ?o Elevate, Exercise Daily and Avoid Standing for Long Periods of Time. ?o Elevate legs to the level of the heart and pump  ankles as often as possible ?o Elevate leg(s) parallel to the floor when sitting. ?o DO YOUR BEST to sleep in the bed at night. DO NOT sleep in your recliner. Long hours of sitting in a recliner leads to ?swelling of the legs and/or potential wounds on your backside. ?Additional Orders / Instructions ?o Follow Nutritious Diet and Increase Protein Intake ?Wound Treatment ?Wound #1 - Lower Leg Wound Laterality: Right ?Cleanser: Soap and Water 1 x Per Week/15 Days ?Discharge Instructions: Gently cleanse wound with antibacterial soap, rinse and pat dry prior to dressing wounds ?Cleanser: Wound Cleanser 1 x Per Week/15 Days ?Discharge Instructions: Wash your hands with soap and water. Remove old dressing, discard into plastic bag and place into trash. ?Cleanse the wound with Wound Cleanser prior to applying a clean dressing using gauze sponges, not tissues or cotton balls. Do not ?scrub or use excessive force. Pat dry using gauze sponges, not tissue or cotton balls. ?Topical: Santyl Collagenase Ointment, 30 (gm), tube 1 x Per Week/15 Days ?Discharge Instructions: apply nickel thick to wound bed only ?Primary Dressing: Xeroform-HBD 2x2 (in/in) 1 x Per Week/15 Days ?Discharge Instructions: Apply Xeroform-HBD 2x2 (in/in) as directed ?Secondary Dressing: ABD Pad 5x9 (in/in) 1 x Per Week/15 Days ?Discharge Instructions: Cover with ABD pad ?Secured With: Medipore Tape - 35M Medipore H Soft Cloth Surgical Tape, 2x2 (in/yd) 1 x Per Week/15 Days ?Secured With: Coban Cohesive Bandage  4x5 (yds) Stretched 1 x Per Week/15 Days ?Discharge Instructions: 2nd layer-Apply Coban as directed. ?Secured With: [employer] or Non-Sterile 6-ply 4.5x4 (yd/yd) 1 x Per Week/15 Days ?Discharge Instructions: 1st layer-Apply Kerlix as directed ?Electronic Signature(s) ?Signed: 10/02/2021 9:44:52 AM By: Kalman Shan DO ?Entered By: Kalman Shan on 10/02/2021 08:59:11 ?Patricia Knox, Patricia Knox ([id number]) ?-------------------------------------------------------------------------------- ?Problem List Details ?Patient Name: Patricia Knox, Patricia Knox ?Date of Service: 10/02/2021 8:00 AM ?Medical Record Number: [id number] ?Patient Account Number: [id number] ?Date of Birth/Sex: Feb 19, 1936 (85 y.o. 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 F) ?Treating RN: Cornell Barman ?Primary Care Provider: Lamonte Sakai Other Clinician: ?Referring Provider: Lamonte Sakai ?Treating Provider/Extender: Kalman Shan ?Weeks in Treatment: 4 ?Subjective ?Chief  Complaint ?Information obtained from Patient ?09/04/2021; skin tear to the right lower extremity ?History of Present Illness (HPI) ?Admission/09/2021 ?Ms. Patricia Knox is an 85 year old female with a past medical history of

## 2021-10-03 NOTE — Progress Notes (Signed)
CAELA, HUOT ([id number]) ?Visit Report for 10/02/2021 ?Arrival Information Details ?Patient Name: Patricia Knox, Patricia Knox ?Date of Service: 10/02/2021 8:00 AM ?Medical Record Number: [id number] ?Patient Account Number: [id number] ?Date of Birth/Sex: Apr 08, 1936 (86 y.o. F) ?Treating RN: Cornell Barman ?Primary Care Chee Dimon: Lamonte Sakai Other Clinician: ?Referring Kirsten Mckone: Lamonte Sakai ?Treating Muhammed Teutsch/Extender: Kalman Shan ?Weeks in Treatment: 4 ?Visit Information History Since Last Visit ?Added or deleted any medications: No ?Patient Arrived: Ambulatory ?Has Dressing in Place as Prescribed: Yes ?Arrival Time: 08:13 ?Pain Present Now: No ?Accompanied By: self ?Transfer Assistance: None ?Patient Identification Verified: Yes ?Secondary Verification Process Completed: Yes ?Patient Requires Transmission-Based No ?Precautions: ?Patient Has Alerts: Yes ?Patient Alerts: Patient on Blood ?Thinner ?NOT diabetic ?ASA '325mg'$  ?Electronic Signature(s) ?Signed: 10/03/2021 5:29:17 PM By: Gretta Cool, BSN, RN, CWS, Kim RN, BSN ?Entered By: Gretta Cool, BSN, RN, CWS, Kim on 10/02/2021 08:14:15 ?ENRIQUETA, AUGUSTA ([id number]) ?-------------------------------------------------------------------------------- ?Clinic Level of Care Assessment Details ?Patient Name: Patricia Knox ?Date of Service: 10/02/2021 8:00 AM ?Medical Record Number: [id number] ?Patient Account Number: [id number] ?Date of Birth/Sex: August 23, 1935 (86 y.o. F) ?Treating RN: Cornell Barman ?Primary Care Wesly Whisenant: Lamonte Sakai Other Clinician: ?Referring Taelynn Mcelhannon: Lamonte Sakai ?Treating Eris Breck/Extender: Kalman Shan ?Weeks in Treatment: 4 ?Clinic Level of Care Assessment Items ?TOOL 4 Quantity Score ?'[]'$  - Use when only an EandM is performed on FOLLOW-UP visit 0 ?ASSESSMENTS - Nursing Assessment / Reassessment ?X - Reassessment of Co-morbidities (includes updates in patient status) 1 10 ?X- 1 5 ?Reassessment of Adherence to Treatment Plan ?ASSESSMENTS - Wound and Skin Assessment / Reassessment ?X  - Simple Wound Assessment / Reassessment - one wound 1 5 ?'[]'$  - 0 ?Complex Wound Assessment / Reassessment - multiple wounds ?'[]'$  - 0 ?Dermatologic / Skin Assessment (not related to wound area) ?ASSESSMENTS - Focused Assessment ?'[]'$  - Circumferential Edema Measurements - multi extremities 0 ?'[]'$  - 0 ?Nutritional Assessment / Counseling / Intervention ?'[]'$  - 0 ?Lower Extremity Assessment (monofilament, tuning fork, pulses) ?'[]'$  - 0 ?Peripheral Arterial Disease Assessment (using hand held doppler) ?ASSESSMENTS - Ostomy and/or Continence Assessment and Care ?'[]'$  - Incontinence Assessment and Management 0 ?'[]'$  - 0 ?Ostomy Care Assessment and Management (repouching, etc.) ?PROCESS - Coordination of Care ?X - Simple Patient / Family Education for ongoing care 1 15 ?'[]'$  - 0 ?Complex (extensive) Patient / Family Education for ongoing care ?X- 1 10 ?Staff obtains Consents, Records, Test Results / Process Orders ?'[]'$  - 0 ?Staff telephones HHA, Nursing Homes / Clarify orders / etc ?'[]'$  - 0 ?Routine Transfer to another Facility (non-emergent condition) ?'[]'$  - 0 ?Routine Hospital Admission (non-emergent condition) ?'[]'$  - 0 ?New Admissions / Biomedical engineer / Ordering NPWT, Apligraf, etc. ?'[]'$  - 0 ?Emergency Hospital Admission (emergent condition) ?X- 1 10 ?Simple Discharge Coordination ?'[]'$  - 0 ?Complex (extensive) Discharge Coordination ?PROCESS - Special Needs ?'[]'$  - Pediatric / Minor Patient Management 0 ?'[]'$  - 0 ?Isolation Patient Management ?'[]'$  - 0 ?Hearing / Language / Visual special needs ?'[]'$  - 0 ?Assessment of Community assistance (transportation, D/C planning, etc.) ?'[]'$  - 0 ?Additional assistance / Altered mentation ?'[]'$  - 0 ?Support Surface(s) Assessment (bed, cushion, seat, etc.) ?INTERVENTIONS - Wound Cleansing / Measurement ?SHANEQUE, MERKLE ([id number]) ?X- 1 5 ?Simple Wound Cleansing - one wound ?'[]'$  - 0 ?Complex Wound Cleansing - multiple wounds ?X- 1 5 ?Wound Imaging (photographs - any number of wounds) ?'[]'$  - 0 ?Wound  Tracing (instead of photographs) ?X- 1 5 ?Simple Wound Measurement - one wound ?'[]'$  - 0 ?Complex Wound Measurement - multiple wounds ?INTERVENTIONS -  Wound Dressings ?'[]'$  - Small Wound Dressing one or multiple wounds 0 ?'[]'$  - 0 ?Medium Wound Dressing one or multiple wounds ?X- 1 20 ?Large Wound Dressing one or multiple wounds ?'[]'$  - 0 ?Application of Medications - topical ?'[]'$  - 0 ?Application of Medications - injection ?INTERVENTIONS - Miscellaneous ?'[]'$  - External ear exam 0 ?'[]'$  - 0 ?Specimen Collection (cultures, biopsies, blood, body fluids, etc.) ?'[]'$  - 0 ?Specimen(s) / Culture(s) sent or taken to Lab for analysis ?'[]'$  - 0 ?Patient Transfer (multiple staff / Civil Service fast streamer / Similar devices) ?'[]'$  - 0 ?Simple Staple / Suture removal (25 or less) ?'[]'$  - 0 ?Complex Staple / Suture removal (26 or more) ?'[]'$  - 0 ?Hypo / Hyperglycemic Management (close monitor of Blood Glucose) ?'[]'$  - 0 ?Ankle / Brachial Index (ABI) - do not check if billed separately ?X- 1 5 ?Vital Signs ?Has the patient been seen at the hospital within the last three years: Yes ?Total Score: 95 ?Level Of Care: New/Established - Level ?3 ?Electronic Signature(s) ?Signed: 10/03/2021 5:29:17 PM By: Gretta Cool, BSN, RN, CWS, Kim RN, BSN ?Entered By: Gretta Cool, BSN, RN, CWS, Kim on 10/02/2021 08:44:18 ?Patricia Knox ([id number]) ?-------------------------------------------------------------------------------- ?Encounter Discharge Information Details ?Patient Name: Patricia Knox ?Date of Service: 10/02/2021 8:00 AM ?Medical Record Number: [id number] ?Patient Account Number: [id number] ?Date of Birth/Sex: 02-Jun-1936 (86 y.o. F) ?Treating RN: Cornell Barman ?Primary Care Harlon Kutner: Lamonte Sakai Other Clinician: ?Referring Aleisa Howk: Lamonte Sakai ?Treating Meshulem Onorato/Extender: Kalman Shan ?Weeks in Treatment: 4 ?Encounter Discharge Information Items ?Discharge Condition: Stable ?Ambulatory Status: Ambulatory ?Discharge Destination: Home ?Transportation: Private Auto ?Accompanied By:  self ?Schedule Follow-up Appointment: Yes ?Clinical Summary of Care: ?Electronic Signature(s) ?Signed: 10/03/2021 5:29:17 PM By: Gretta Cool, BSN, RN, CWS, Kim RN, BSN ?Entered By: Gretta Cool, BSN, RN, CWS, Kim on 10/02/2021 08:46:39 ?YLIANA, GRAVOIS ([id number]) ?-------------------------------------------------------------------------------- ?Lower Extremity Assessment Details ?Patient Name: RILEE, KNOLL ?Date of Service: 10/02/2021 8:00 AM ?Medical Record Number: [id number] ?Patient Account Number: [id number] ?Date of Birth/Sex: June 25, 1935 (86 y.o. F) ?Treating RN: Cornell Barman ?Primary Care Marquisha Nikolov: Lamonte Sakai Other Clinician: ?Referring Marlyss Cissell: Lamonte Sakai ?Treating Daci Stubbe/Extender: Kalman Shan ?Weeks in Treatment: 4 ?Edema Assessment ?Assessed: [Left: No] [Right: No] ?[Left: Edema] [Right: :] ?Calf ?Left: Right: ?Point of Measurement: 31 cm From Medial Instep 33 cm ?Ankle ?Left: Right: ?Point of Measurement: 9 cm From Medial Instep 21 cm ?Vascular Assessment ?Pulses: ?Dorsalis Pedis ?Palpable: [Left:Yes] ?Electronic Signature(s) ?Signed: 10/03/2021 5:29:17 PM By: Gretta Cool, BSN, RN, CWS, Kim RN, BSN ?Entered By: Gretta Cool, BSN, RN, CWS, Kim on 10/02/2021 08:32:00 ?NIKEA, SETTLE ([id number]) ?-------------------------------------------------------------------------------- ?Multi Wound Chart Details ?Patient Name: RIHANA, KIDDY ?Date of Service: 10/02/2021 8:00 AM ?Medical Record Number: [id number] ?Patient Account Number: [id number] ?Date of Birth/Sex: 1936/02/21 (86 y.o. F) ?Treating RN: Cornell Barman ?Primary Care Gaetan Spieker: Lamonte Sakai Other Clinician: ?Referring Hayde Kilgour: Lamonte Sakai ?Treating Jazara Swiney/Extender: Kalman Shan ?Weeks in Treatment: 4 ?Vital Signs ?Height(in): 62 ?Pulse(bpm): 69 ?Weight(lbs): 144 ?Blood Pressure(mmHg): 136/79 ?Body Mass Index(BMI): 26.3 ?Temperature(??F): 97.6 ?Respiratory Rate(breaths/min): 18 ?Photos: [1:No Photos] [N/A:N/A] ?Wound Location: [1:Right Lower Leg] [N/A:N/A] ?Wounding Event:  [1:Skin Tear/Laceration] [N/A:N/A] ?Primary Etiology: [1:Skin Tear] [N/A:N/A] ?Comorbid History: [1:Cataracts, Angina, Coronary Artery Disease, Hypertension, Osteoarthritis] [N/A:N/A] ?Date Acquired: [1:08/06/18

## 2021-10-04 ENCOUNTER — Ambulatory Visit
Admission: RE | Admit: 2021-10-04 | Discharge: 2021-10-04 | Disposition: A | Discharge status: Home / Self Care | Payer: Medicare Other | Source: Ambulatory Visit | Attending: Internal Medicine | Admitting: Internal Medicine

## 2021-10-04 DIAGNOSIS — Z1231 Encounter for screening mammogram for malignant neoplasm of breast: Secondary | ICD-10-CM | POA: Diagnosis not present

## 2021-10-09 ENCOUNTER — Encounter (HOSPITAL_BASED_OUTPATIENT_CLINIC_OR_DEPARTMENT_OTHER): Payer: Medicare Other | Admitting: Internal Medicine

## 2021-10-09 DIAGNOSIS — I10 Essential (primary) hypertension: Secondary | ICD-10-CM

## 2021-10-09 DIAGNOSIS — S80811A Abrasion, right lower leg, initial encounter: Secondary | ICD-10-CM

## 2021-10-09 DIAGNOSIS — I872 Venous insufficiency (chronic) (peripheral): Secondary | ICD-10-CM | POA: Diagnosis not present

## 2021-10-09 DIAGNOSIS — L97812 Non-pressure chronic ulcer of other part of right lower leg with fat layer exposed: Secondary | ICD-10-CM

## 2021-10-09 DIAGNOSIS — I878 Other specified disorders of veins: Secondary | ICD-10-CM | POA: Diagnosis not present

## 2021-10-09 NOTE — Progress Notes (Signed)
Patricia Knox, Patricia Knox ([id number]) ?Visit Report for 10/09/2021 ?Chief Complaint Document Details ?Patient Name: Patricia Knox, Patricia Knox ?Date of Service: 10/09/2021 8:30 AM ?Medical Record Number: [id number] ?Patient Account Number: [id number] ?Date of Birth/Sex: [birth year] (86 y.o. F) ?Treating RN: Donnamarie Poag ?Primary Care Provider: Lamonte Sakai Other Clinician: ?Referring Provider: Lamonte Sakai ?Treating Provider/Extender: Kalman Shan ?Weeks in Treatment: 5 ?Information Obtained from: Patient ?Chief Complaint ?09/04/2021; skin tear to the right lower extremity ?Electronic Signature(s) ?Signed: 10/09/2021 9:24:52 AM By: Kalman Shan DO ?Entered By: Kalman Shan on 10/09/2021 09:13:21 ?Patricia Knox, Patricia Knox ([id number]) ?-------------------------------------------------------------------------------- ?HPI Details ?Patient Name: Patricia Knox, Patricia Knox ?Date of Service: 10/09/2021 8:30 AM ?Medical Record Number: [id number] ?Patient Account Number: [id number] ?Date of Birth/Sex: April 08, 1936 (86 y.o. F) ?Treating RN: Donnamarie Poag ?Primary Care Provider: Lamonte Sakai Other Clinician: ?Referring Provider: Lamonte Sakai ?Treating Provider/Extender: Kalman Shan ?Weeks in Treatment: 5 ?History of Present Illness ?HPI Description: Admission/09/2021 ?[name] is an 86 year old female with a past medical history of essential hypertension that presents to the clinic for a one month history ?of right lower extremity wound after hitting an object. She has not been using any wound dressings and keeping the area open to air. She was ?recently seen by her primary care provider on March 21 and prescribed Augmentin. She completed this course. She currently denies signs of ?infection. She does not wear compression stockings. ?4/12; patient with a traumatic wound on the right anterior mid tibia. Using gentamicin Hydrofera Blue and kerlix Coban. She does not have a prior ?history of wounds however clinically she has very significant venous hypertension  with hemosiderin deposition and fragile skin in her lower ?extremities. ?4/19; patient presents for follow-up. She has no issues or complaints today. She has tolerated the compression wrap well. ?4/26; patient presents for follow-up. She continues to tolerate the wrap well. She has no issues or complaints today. ?5/3; patient presents for follow-up. She has no issues or complaints today. There is improvement in wound healing. ?5/10; patient presents for follow-up. She has no issues or complaints today. She reports improvement in wound healing. ?Electronic Signature(s) ?Signed: 10/09/2021 9:24:52 AM By: Kalman Shan DO ?Entered By: Kalman Shan on 10/09/2021 09:17:36 ?Patricia Knox, Patricia Knox ([id number]) ?-------------------------------------------------------------------------------- ?Physical Exam Details ?Patient Name: Patricia Knox, Patricia Knox ?Date of Service: 10/09/2021 8:30 AM ?Medical Record Number: [id number] ?Patient Account Number: [id number] ?Date of Birth/Sex: 04/06/1936 (86 y.o. F) ?Treating RN: Donnamarie Poag ?Primary Care Provider: Lamonte Sakai Other Clinician: ?Referring Provider: Lamonte Sakai ?Treating Provider/Extender: Kalman Shan ?Weeks in Treatment: 5 ?Constitutional ?. ?Cardiovascular ?[name] ?Psychiatric ?[name] ?Notes ?Right lower extremity: To the anterior aspect there is epithelization to the previous wound site. ?Electronic Signature(s) ?Signed: 10/09/2021 9:24:52 AM By: Kalman Shan DO ?Entered By: Kalman Shan on 10/09/2021 09:18:00 ?Patricia Knox, Patricia Knox ([id number]) ?-------------------------------------------------------------------------------- ?Physician Orders Details ?Patient Name: Patricia Knox, Patricia Knox ?Date of Service: 10/09/2021 8:30 AM ?Medical Record Number: [id number] ?Patient Account Number: [id number] ?Date of Birth/Sex: [birth year] (86 y.o. F) ?Treating RN: Donnamarie Poag ?Primary Care Provider: Lamonte Sakai Other Clinician: ?Referring Provider: Lamonte Sakai ?Treating Provider/Extender: Kalman Shan ?Weeks in Treatment: 5 ?Verbal / Phone Orders: No ?Diagnosis Coding ?Discharge From [facility] ?o Discharge from [facility] Complete ?o Wear compression garments daily. Put garments on first thing when you wake up and remove them before bed. - ?REcommend light compression-protect newly healed skin ?Electronic Signature(s) ?Signed: 10/09/2021 9:24:52 AM By: Kalman Shan DO ?Entered By: Kalman Shan on 10/09/2021 09:19:42 ?Patricia Knox, Patricia Knox ([id number]) ?-------------------------------------------------------------------------------- ?Problem List Details ?Patient Name: Patricia Knox,  Patricia G. ?Date of Service: 10/09/2021 8:30 AM ?Medical Record Number: [id number] ?Patient Account Number: [id number] ?Date of Birth/Sex: March 02, 1936 (86 y.o. 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 09/04/2021 No Yes ?exposed ?I87.2 Venous insufficiency (chronic) (peripheral) 09/04/2021 No Yes ?Inactive Problems ?Resolved Problems ?Electronic Signature(s) ?Signed: 10/09/2021 9:24:52 AM By: Kalman Shan DO ?Entered By: Kalman Shan on 10/09/2021 09:13:17 ?Patricia Knox, Patricia Knox ([id number]) ?-------------------------------------------------------------------------------- ?Progress Note Details ?Patient Name: Patricia Knox, Patricia Knox ?Date of Service: 10/09/2021 8:30 AM ?Medical Record Number: [id number] ?Patient Account Number: [id number] ?Date of Birth/Sex: September 13, 1935 (86 y.o. F) ?Treating RN: Donnamarie Poag ?Primary Care Provider: Lamonte Sakai Other Clinician: ?Referring Provider: Lamonte Sakai ?Treating  Provider/Extender: Kalman Shan ?Weeks in Treatment: 5 ?Subjective ?Chief Complaint ?Information obtained from Patient ?09/04/2021; skin tear to the right lower extremity ?History of Present Illness (HPI) ?Admission/09/2021 ?Ms. Alanya Mercer is an 86 year old female with a past medical history of essential hypertension that presents to the clinic for a one month history ?of right lower extremity wound after hitting an object. She has not been using any wound dressings and keeping the area open to air. She was ?recently seen by her primary care provider on March 21 and prescribed Augmentin. She completed this course. She currently denies signs of ?infection. She does not wear compression stockings. ?4/12; patient with a traumatic wound on the right anterior mid tibia. Using gentamicin Hydrofera Blue and kerlix Coban. She does not have a prior ?history of wounds however clinically she has very significant venous hypertension with hemosiderin deposition and fragile skin in her lower ?extremities. ?4/19; patient presents for follow-up. She has no issues or complaints today. She has tolerated the compression wrap well. ?4/26; patient presents for follow-up. She continues to tolerate the wrap well. She has no issues or complaints today. ?5/3; patient presents for follow-up. She has no issues or complaints today. There is improvement in wound healing. ?5/10; patient presents for follow-up. She has no issues or complaints today. She reports improvement in wound healing. ?Objective ?Constitutional ?Vitals Time Taken: 8:39 AM, Height: 62 in, Weight: 144 lbs, BMI: 26.3, Temperature: 97.7 ??F, Pulse: 65 bpm, Respiratory Rate: 16 breaths/min, ?Blood Pressure: 135/88 mmHg. ?General Notes: Right lower extremity: To the anterior aspect there is epithelization to the previous wound site. ?Integumentary (Hair, Skin) ?Wound #1 status is Healed - Epithelialized. Original cause of wound was Skin Tear/Laceration. The date acquired was:  08/05/2021. The wound has ?been in treatment 5 weeks. The wound is located on the Right Lower Leg. The wound measures 0cm length x 0cm width x 0cm depth; 0cm^2 ?area and 0cm^3 volume. There is a none present amount of

## 2021-10-09 NOTE — Progress Notes (Signed)
KELLE, RUPPERT ([id number]) ?Visit Report for 10/09/2021 ?Arrival Information Details ?Patient Name: Patricia Knox, Patricia Knox ?Date of Service: 10/09/2021 8:30 AM ?Medical Record Number: [id number] ?Patient Account Number: [id number] ?Date of Birth/Sex: Jul 27, 1935 (86 y.o. F) ?Treating RN: Donnamarie Poag ?Primary Care Jayln Branscom: Lamonte Sakai Other Clinician: ?Referring Breckin Savannah: Lamonte Sakai ?Treating Senya Hinzman/Extender: Kalman Shan ?Weeks in Treatment: 5 ?Visit Information History Since Last Visit ?Added or deleted any medications: No ?Patient Arrived: Ambulatory ?Had a fall or experienced change in No ?Arrival Time: 08:35 ?activities of daily living that may affect ?Accompanied By: self ?risk of falls: ?Transfer Assistance: None ?Hospitalized since last visit: No ?Patient Identification Verified: Yes ?Has Dressing in Place as Prescribed: Yes ?Secondary Verification Process Completed: Yes ?Has Compression in Place as Prescribed: Yes ?Patient Requires Transmission-Based No ?Pain Present Now: No ?Precautions: ?Patient Has Alerts: Yes ?Patient Alerts: Patient on Blood ?Thinner ?NOT diabetic ?ASA '325mg'$  ?Electronic Signature(s) ?Signed: 10/09/2021 2:56:37 PM By: Donnamarie Poag ?Entered ByDonnamarie Poag on 10/09/2021 08:39:43 ?SHYANE, FOSSUM ([id number]) ?-------------------------------------------------------------------------------- ?Clinic Level of Care Assessment Details ?Patient Name: Patricia, Knox ?Date of Service: 10/09/2021 8:30 AM ?Medical Record Number: [id number] ?Patient Account Number: [id number] ?Date of Birth/Sex: [birth year] (86 y.o. F) ?Treating RN: Donnamarie Poag ?Primary Care Jeffory Snelgrove: Lamonte Sakai Other Clinician: ?Referring Grayland Daisey: Lamonte Sakai ?Treating Katara Griner/Extender: Kalman Shan ?Weeks in Treatment: 5 ?Clinic Level of Care Assessment Items ?TOOL 4 Quantity Score ?'[]'$  - Use when only an EandM is performed on FOLLOW-UP visit 0 ?ASSESSMENTS - Nursing Assessment / Reassessment ?'[]'$  - Reassessment of Co-morbidities  (includes updates in patient status) 0 ?'[]'$  - 0 ?Reassessment of Adherence to Treatment Plan ?ASSESSMENTS - Wound and Skin Assessment / Reassessment ?X - Simple Wound Assessment / Reassessment - one wound 1 5 ?'[]'$  - 0 ?Complex Wound Assessment / Reassessment - multiple wounds ?'[]'$  - 0 ?Dermatologic / Skin Assessment (not related to wound area) ?ASSESSMENTS - Focused Assessment ?'[]'$  - Circumferential Edema Measurements - multi extremities 0 ?'[]'$  - 0 ?Nutritional Assessment / Counseling / Intervention ?'[]'$  - 0 ?Lower Extremity Assessment (monofilament, tuning fork, pulses) ?'[]'$  - 0 ?Peripheral Arterial Disease Assessment (using hand held doppler) ?ASSESSMENTS - Ostomy and/or Continence Assessment and Care ?'[]'$  - Incontinence Assessment and Management 0 ?'[]'$  - 0 ?Ostomy Care Assessment and Management (repouching, etc.) ?PROCESS - Coordination of Care ?X - Simple Patient / Family Education for ongoing care 1 15 ?'[]'$  - 0 ?Complex (extensive) Patient / Family Education for ongoing care ?'[]'$  - 0 ?Staff obtains Consents, Records, Test Results / Process Orders ?'[]'$  - 0 ?Staff telephones HHA, Nursing Homes / Clarify orders / etc ?'[]'$  - 0 ?Routine Transfer to another Facility (non-emergent condition) ?'[]'$  - 0 ?Routine Hospital Admission (non-emergent condition) ?'[]'$  - 0 ?New Admissions / Biomedical engineer / Ordering NPWT, Apligraf, etc. ?'[]'$  - 0 ?Emergency Hospital Admission (emergent condition) ?X- 1 10 ?Simple Discharge Coordination ?'[]'$  - 0 ?Complex (extensive) Discharge Coordination ?PROCESS - Special Needs ?'[]'$  - Pediatric / Minor Patient Management 0 ?'[]'$  - 0 ?Isolation Patient Management ?'[]'$  - 0 ?Hearing / Language / Visual special needs ?'[]'$  - 0 ?Assessment of Community assistance (transportation, D/C planning, etc.) ?'[]'$  - 0 ?Additional assistance / Altered mentation ?'[]'$  - 0 ?Support Surface(s) Assessment (bed, cushion, seat, etc.) ?INTERVENTIONS - Wound Cleansing / Measurement ?LANEYA, GASAWAY ([id number]) ?X- 1 5 ?Simple Wound  Cleansing - one wound ?'[]'$  - 0 ?Complex Wound Cleansing - multiple wounds ?X- 1 5 ?Wound Imaging (photographs - any number of wounds) ?'[]'$  - 0 ?Wound Tracing (instead of  photographs) ?X- 1 5 ?Simple Wound Measurement - one wound ?'[]'$  - 0 ?Complex Wound Measurement - multiple wounds ?INTERVENTIONS - Wound Dressings ?'[]'$  - Small Wound Dressing one or multiple wounds 0 ?'[]'$  - 0 ?Medium Wound Dressing one or multiple wounds ?'[]'$  - 0 ?Large Wound Dressing one or multiple wounds ?X- 1 5 ?Application of Medications - topical ?'[]'$  - 0 ?Application of Medications - injection ?INTERVENTIONS - Miscellaneous ?'[]'$  - External ear exam 0 ?'[]'$  - 0 ?Specimen Collection (cultures, biopsies, blood, body fluids, etc.) ?'[]'$  - 0 ?Specimen(s) / Culture(s) sent or taken to Lab for analysis ?'[]'$  - 0 ?Patient Transfer (multiple staff / Civil Service fast streamer / Similar devices) ?'[]'$  - 0 ?Simple Staple / Suture removal (25 or less) ?'[]'$  - 0 ?Complex Staple / Suture removal (26 or more) ?'[]'$  - 0 ?Hypo / Hyperglycemic Management (close monitor of Blood Glucose) ?'[]'$  - 0 ?Ankle / Brachial Index (ABI) - do not check if billed separately ?X- 1 5 ?Vital Signs ?Has the patient been seen at the hospital within the last three years: Yes ?Total Score: 55 ?Level Of Care: New/Established - Level ?2 ?Electronic Signature(s) ?Signed: 10/09/2021 2:56:37 PM By: Donnamarie Poag ?Entered ByDonnamarie Poag on 10/09/2021 08:59:14 ?SINTHIA, KARABIN ([id number]) ?-------------------------------------------------------------------------------- ?Encounter Discharge Information Details ?Patient Name: Patricia, Knox ?Date of Service: 10/09/2021 8:30 AM ?Medical Record Number: [id number] ?Patient Account Number: [id number] ?Date of Birth/Sex: 01/28/1936 (86 y.o. F) ?Treating RN: Donnamarie Poag ?Primary Care Marquel Pottenger: Lamonte Sakai Other Clinician: ?Referring Nitara Szczerba: Lamonte Sakai ?Treating Ikechukwu Cerny/Extender: Kalman Shan ?Weeks in Treatment: 5 ?Encounter Discharge Information Items ?Discharge Condition:  Stable ?Ambulatory Status: Ambulatory ?Discharge Destination: Home ?Transportation: Private Auto ?Accompanied By: self ?Schedule Follow-up Appointment: No ?Clinical Summary of Care: ?Electronic Signature(s) ?Signed: 10/09/2021 2:56:37 PM By: Donnamarie Poag ?Entered ByDonnamarie Poag on 10/09/2021 09:10:32 ?KENASIA, SCHELLER ([id number]) ?-------------------------------------------------------------------------------- ?Lower Extremity Assessment Details ?Patient Name: JONNELL, HENTGES ?Date of Service: 10/09/2021 8:30 AM ?Medical Record Number: [id number] ?Patient Account Number: [id number] ?Date of Birth/Sex: [birth year] (85 y.o. F) ?Treating RN: Donnamarie Poag ?Primary Care Sheddrick Lattanzio: Lamonte Sakai Other Clinician: ?Referring Naryiah Schley: Lamonte Sakai ?Treating Baltazar Pekala/Extender: Kalman Shan ?Weeks in Treatment: 5 ?Edema Assessment ?Assessed: [Left: No] [Right: Yes] ?Edema: [Left: N] [Right: o] ?Calf ?Left: Right: ?Point of Measurement: From Medial Instep 33 cm ?Ankle ?Left: Right: ?Point of Measurement: From Medial Instep 20 cm ?Vascular Assessment ?Pulses: ?Dorsalis Pedis ?Palpable: [Right:Yes] ?Electronic Signature(s) ?Signed: 10/09/2021 2:56:37 PM By: Donnamarie Poag ?Entered ByDonnamarie Poag on 10/09/2021 08:45:42 ?TAHEERAH, GULDIN ([id number]) ?-------------------------------------------------------------------------------- ?Multi Wound Chart Details ?Patient Name: RAFAELLA, KOLE ?Date of Service: 10/09/2021 8:30 AM ?Medical Record Number: [id number] ?Patient Account Number: [id number] ?Date of Birth/Sex: 12-22-1935 (85 y.o. F) ?Treating RN: Donnamarie Poag ?Primary Care Takiera Mayo: Lamonte Sakai Other Clinician: ?Referring Kellene Mccleary: Lamonte Sakai ?Treating Sedale Jenifer/Extender: Kalman Shan ?Weeks in Treatment: 5 ?Vital Signs ?Height(in): 62 ?Pulse(bpm): 65 ?Weight(lbs): 144 ?Blood Pressure(mmHg): 135/88 ?Body Mass Index(BMI): 26.3 ?Temperature(??F): 97.7 ?Respiratory Rate(breaths/min): 16 ?Photos: [N/A:N/A] ?Wound Location: Right Lower Leg  N/A N/A ?Wounding Event: Skin Tear/Laceration N/A N/A ?Primary Etiology: Skin Tear N/A N/A ?Comorbid History: Cataracts, Angina, Coronary Artery N/A N/A ?Disease, Hypertension, ?Osteoarthritis ?Date Acquire

## 2021-10-11 DIAGNOSIS — G2581 Restless legs syndrome: Secondary | ICD-10-CM | POA: Diagnosis not present

## 2021-10-11 DIAGNOSIS — E782 Mixed hyperlipidemia: Secondary | ICD-10-CM | POA: Diagnosis not present

## 2021-10-11 DIAGNOSIS — M8589 Other specified disorders of bone density and structure, multiple sites: Secondary | ICD-10-CM | POA: Diagnosis not present

## 2021-10-11 DIAGNOSIS — I10 Essential (primary) hypertension: Secondary | ICD-10-CM | POA: Diagnosis not present

## 2021-10-11 DIAGNOSIS — R7302 Impaired glucose tolerance (oral): Secondary | ICD-10-CM | POA: Diagnosis not present

## 2021-10-16 ENCOUNTER — Ambulatory Visit: Payer: Medicare Other | Admitting: Physician Assistant

## 2021-10-20 ENCOUNTER — Other Ambulatory Visit: Payer: Self-pay | Admitting: Neurology

## 2021-11-28 ENCOUNTER — Telehealth: Payer: Self-pay | Admitting: Neurology

## 2021-11-28 NOTE — Telephone Encounter (Signed)
I called pt got Botox appt rescheduled due to provider being out.

## 2021-12-02 ENCOUNTER — Telehealth: Payer: Self-pay | Admitting: Neurology

## 2021-12-02 NOTE — Telephone Encounter (Signed)
Pt is calling to schedule her Botox appointment.

## 2021-12-04 MED ORDER — DYSPORT 300 UNITS IM SOLR: 300.0000 [IU] | INTRAMUSCULAR | 3 refills | Status: DC

## 2021-12-04 NOTE — Addendum Note (Signed)
Addended by: Noberto Retort C on: 12/04/2021 05:19 PM   Modules accepted: Orders

## 2021-12-04 NOTE — Telephone Encounter (Signed)
Rx sent to requested pharmacy

## 2021-12-04 NOTE — Telephone Encounter (Signed)
Please send Dysport Rx to [facility].

## 2021-12-18 ENCOUNTER — Ambulatory Visit: Payer: Medicare Other | Admitting: Neurology

## 2021-12-18 ENCOUNTER — Telehealth: Payer: Self-pay | Admitting: Neurology

## 2021-12-18 NOTE — Telephone Encounter (Signed)
I called pt got Dysport Injection rescheduled.

## 2021-12-18 NOTE — Telephone Encounter (Signed)
Pt called and cancel her Botox shot because she will be at beach. Pt would like to be contacted so she can re-schedule appointment.

## 2021-12-18 NOTE — Telephone Encounter (Signed)
Please send Dysport rx to [facility].

## 2022-01-15 ENCOUNTER — Ambulatory Visit: Payer: Medicare Other | Admitting: Neurology

## 2022-01-19 ENCOUNTER — Other Ambulatory Visit: Payer: Self-pay | Admitting: Neurology

## 2022-01-20 NOTE — Telephone Encounter (Signed)
Rx refilled.

## 2022-01-27 DIAGNOSIS — K219 Gastro-esophageal reflux disease without esophagitis: Secondary | ICD-10-CM | POA: Diagnosis not present

## 2022-01-27 DIAGNOSIS — Z0001 Encounter for general adult medical examination with abnormal findings: Secondary | ICD-10-CM | POA: Diagnosis not present

## 2022-01-27 DIAGNOSIS — R7302 Impaired glucose tolerance (oral): Secondary | ICD-10-CM | POA: Diagnosis not present

## 2022-01-27 DIAGNOSIS — I10 Essential (primary) hypertension: Secondary | ICD-10-CM | POA: Diagnosis not present

## 2022-01-27 DIAGNOSIS — M8589 Other specified disorders of bone density and structure, multiple sites: Secondary | ICD-10-CM | POA: Diagnosis not present

## 2022-01-27 DIAGNOSIS — G2581 Restless legs syndrome: Secondary | ICD-10-CM | POA: Diagnosis not present

## 2022-01-27 DIAGNOSIS — E782 Mixed hyperlipidemia: Secondary | ICD-10-CM | POA: Diagnosis not present

## 2022-01-28 ENCOUNTER — Telehealth: Payer: Self-pay

## 2022-01-28 NOTE — Telephone Encounter (Signed)
Spoke with Cyril Mourning RN verbally on this. Ok to keep on the schedule.
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
## 2022-01-29 ENCOUNTER — Ambulatory Visit: Payer: Medicare Other | Admitting: Neurology

## 2022-01-31 DIAGNOSIS — E782 Mixed hyperlipidemia: Secondary | ICD-10-CM | POA: Diagnosis not present

## 2022-01-31 DIAGNOSIS — R7302 Impaired glucose tolerance (oral): Secondary | ICD-10-CM | POA: Diagnosis not present

## 2022-01-31 DIAGNOSIS — I10 Essential (primary) hypertension: Secondary | ICD-10-CM | POA: Diagnosis not present

## 2022-01-31 DIAGNOSIS — K219 Gastro-esophageal reflux disease without esophagitis: Secondary | ICD-10-CM | POA: Diagnosis not present

## 2022-02-26 ENCOUNTER — Ambulatory Visit: Payer: Medicare Other | Admitting: Neurology

## 2022-02-26 VITALS — BP 146/84 | HR 84 | Wt 144.0 lb

## 2022-02-26 DIAGNOSIS — G5132 Clonic hemifacial spasm, left: Secondary | ICD-10-CM | POA: Diagnosis not present

## 2022-02-26 MED ORDER — ABOBOTULINUMTOXINA 300 UNITS IM SOLR: 300.0000 [IU] | Freq: Once | INTRAMUSCULAR | Status: DC

## 2022-02-26 NOTE — Progress Notes (Signed)
PATIENT: Patricia Knox  DOB: 1935/06/15  Chief Complaint  Patient presents with   Hemifacial Spasms    Dysport 300 units x 1 vial - office supply     HISTORICAL  Patricia Knox is a 86 year old right-handed female, seen in refer by her primary care doctor  Patricia Knox for continued EMG guided injection for her left hemifacial spasm.  She had a gradual onset left hemifacial spasm for many years,  She began to receive EMG guided incobotulism toxin a by Dr. Sherley Bounds at [facility] works very well for her, last injection was in July 2017,used total 27.5 units, but because of the insurance reason she has to switch to different practice,she hopes to continue injection through our office.  She had a history of restless leg syndrome, complains of frequent bilateral calf muscle cramping, increased recently, woke her up few times each night, despite taking Requip 0.5 milligrams twice a day,  UPDATE Dec 27th 2017: She came in for EMG guided xeomin injection for left hemifacial spasm today, potential side effect pain  UPDATE September 25 2016: She had suboptimal response to previous injection, noticed left cheek area twitching, no significant side effect noticed,  Gabapentin 100 mg 3 tablets every night has worked well for her nighttime leg cramping.  UPDATE January 07 2017:  she responded very well to previous injection, no significant side effect noted  UPDATE Apr 15 2017:  She responded very well to previous injection, noticed recurrent left eye twitching, left cheek muscle twitching now  Update July 20, 2017: She responded well to previous injection, there was no significant side effect noticed, only recent few weeks she noticed recurrent frequent left eye twitching, involving left cheek as well,  UPDATE Oct 20 2017: She responded very well to previous injection  UPDATE January 12 2018: She responded well to previous injection,   UPDATE Apr 14 2018: She did very  well with previous injection, this time will use Botox a 100 units  UPDATE Jul 15 2018: She did well with previous injection  UPDATE November 10 2018: She responded well to previous injections.  UPDATE Oct 25 2019: She did well, no significant side effect noted.  UPDATE May 09 2020: She had long interview from previous injection, complains of worsening recurrent left hemifacial muscle spasm  UPDATE October 31 2020: It has been 6 months since previous injection due to insurance reasons, she has to switch to Dysport 300 units per insurance, previously only used Xeomin 50 units, she not noticed frequent left hemifacial muscle spasm to the point of closing her left eye sometimes  UPDATE January 30 2021: She responded well to previous injection,   Update May 01, 2021: She responded well to previous injection, no significant side effect noted  UPDATE September 24 2021: She responded well to injection, did notice facial asymmetry, low-lying left nasolabial fold  Update February 26, 2022 This is a 30-monthfrom previous injection, she noticed increased left facial muscle spasm  PHYSICAL EXAM   Vitals:   04/15/17 1350  BP: 124/77  Pulse: 68  Weight: 155 lb (70.3 kg)  Height: '5\' 2"'$  (1.575 m)   Body mass index is 28.35 kg/m.    She has frequent l left hemifacial spasm, involving left orbicularis oculi, frequent involvement of left cheek, left frontalis, and the orbicularis auris, platysmas muscles. Mild asymmetry of left face, droopiness of left face     ASSESSMENT AND PLAN  Patricia GHALEN MOSSBARGERis a 86  y.o. female    Left hemifacial spasm Dysport 300 units units in 2.0 cc of NS, we used 1.35, discard the rest.  Left orbicularis oculi at 3, 4, 5, 6, 8, 10   (0.05 ccx6= 0.7 cc) Right orbicularis oculi at  8, 9, 7 (0.05 cc  x 3= 0.15 cc)  Left frontalis 0.05ccx2= 0.1 cc Right frontalis 0.05ccx2= 0.1 cc  Left zygomatic major 0.05 cc x2=0.1 cc Left platysmas 0.05 x4=0.2 cc  She will  return to clinic in 3 months for repeat injection   Marcial Pacas, M.D. Ph.D.  [facility] [address], [place], [place] [address] Ph: [phone] Fax: [phone]  CC: Patricia Maltese, MD

## 2022-02-26 NOTE — Progress Notes (Signed)
Dysport 300 units x 1 vial  [phone] Lot -[id number] Exp-09/30/2023 SP

## 2022-04-14 ENCOUNTER — Other Ambulatory Visit: Payer: Self-pay | Admitting: Neurology

## 2022-05-05 ENCOUNTER — Telehealth: Payer: Self-pay | Admitting: Neurology

## 2022-05-05 NOTE — Telephone Encounter (Signed)
Pt is scheduled for Dysport injection on 05/21/22. PA for Dysport expired 03/27/22. Pt will need a new authorization before appointment

## 2022-05-05 NOTE — Telephone Encounter (Signed)
PA renewal needed  Dysport 300 units G51.32 EMG guidance used for injections

## 2022-05-06 NOTE — Telephone Encounter (Addendum)
Pharmacy benefit:  Submitted a Prior Authorization request to [facility] for  Dysport 300 unit  via CoverMyMeds. Will update once we receive a response.  (Key: [id number]) - [id number]  Medical benefit:

## 2022-05-07 NOTE — Telephone Encounter (Signed)
Patient Advocate Encounter  Received notification that the request for prior authorization for Dysport 300UNIT solution udner pharmacy benefit has been denied due to at least 3 months must have elapsed since the last treatment of Dysport (has not been 3 months).       Lyndel Safe, [place] Patient Advocate Specialist [place] Patient Advocate Team Direct Number: [phone]  Fax: [phone]

## 2022-05-08 ENCOUNTER — Encounter: Payer: Self-pay | Admitting: Neurology

## 2022-05-08 NOTE — Telephone Encounter (Signed)
Per Pharmacy team, pt's Dysport will not be eligable for renewal until after 05/28/2022. Please call pt and move this appt out by 1-2 weeks from this date.   I will set a reminder to contact the pharmacy team again on 05/28/22 for this to be processed again

## 2022-05-08 NOTE — Telephone Encounter (Signed)
LVM and sent letter informing pt of dysport injection reschedule from 05/21/22 to 06/19/22 at 1:00pm due to needing new PA

## 2022-05-21 ENCOUNTER — Ambulatory Visit: Payer: Medicare Other | Admitting: Neurology

## 2022-05-28 NOTE — Telephone Encounter (Signed)
Noted, thank you

## 2022-05-29 NOTE — Telephone Encounter (Signed)
Submitted Expedited Appeal for Dysport.  Will continue to update.

## 2022-06-03 DIAGNOSIS — Z23 Encounter for immunization: Secondary | ICD-10-CM | POA: Diagnosis not present

## 2022-06-03 DIAGNOSIS — I10 Essential (primary) hypertension: Secondary | ICD-10-CM | POA: Diagnosis not present

## 2022-06-03 DIAGNOSIS — R7302 Impaired glucose tolerance (oral): Secondary | ICD-10-CM | POA: Diagnosis not present

## 2022-06-03 DIAGNOSIS — E782 Mixed hyperlipidemia: Secondary | ICD-10-CM | POA: Diagnosis not present

## 2022-06-12 NOTE — Telephone Encounter (Signed)
Reminder, this patient is scheduled for 1/18.

## 2022-06-19 ENCOUNTER — Other Ambulatory Visit (HOSPITAL_COMMUNITY): Payer: Self-pay

## 2022-06-19 ENCOUNTER — Telehealth: Payer: Self-pay | Admitting: Neurology

## 2022-06-19 ENCOUNTER — Ambulatory Visit: Payer: Medicare Other | Admitting: Neurology

## 2022-06-19 NOTE — Telephone Encounter (Signed)
Patricia Knox - Pt showed up for appt and medication is not here. Pt advised she paid $200 to [facility] for the first time ever. She received a letter and mailed in payment. Please work on Animator approval and contact the patient to R/S.

## 2022-06-19 NOTE — Telephone Encounter (Signed)
   The PA was approved-good from 05/06/2022 through 08/29/2022. I am not sure who she gave money to but the PA was taken care of on our end.

## 2022-06-19 NOTE — Telephone Encounter (Signed)
Checking on this request. We have not heard about the expedited appeal. Pt arrived to the clinic and was agreeable to reschedule appt but states she has paid 200 oop for Dysport already.    I am not sure who collected the 200 $ fee but we do not have any medication in stock with her name on it.   I will updated the pt once I know more on this.

## 2022-06-24 NOTE — Telephone Encounter (Signed)
We have received approval for botox. Can we call [facility] and see about shipment, I think the pt has paid for the drug already and refills should be on file.  Thank you.

## 2022-06-24 NOTE — Telephone Encounter (Signed)
I called and spoke with [facility]. They're stating that the pt called them in November 2023 and cancelled her prescription with them, stating she was going to fill it through [facility]. I called the SP for [facility] and they do not have an account for her. Her insurance card states [facility] is to be used.  Are you able to give [facility] a call to restart her Dysport prescription with them when you get a chance? Thank you.

## 2022-06-25 NOTE — Telephone Encounter (Signed)
I called [facility] and spoke with Danae Chen. Rx for Dysport is reinstated pt needs to call and schedule delivery. There is oop that has not been met (290 $). [facility] had no record of pt paying copay since April of 2023.If she would like to move a head with medication being shipped to our office she can call [facility]..  If pt sts she paid already I would have her call the pharmacy the money was paid to for reimbursement.

## 2022-06-26 ENCOUNTER — Telehealth: Payer: Self-pay | Admitting: Neurology

## 2022-06-26 NOTE — Telephone Encounter (Signed)
Called and spoke with pt, I provided her with [facility] phone number to reach out. She stated she would call and try to arrange shipment as well as figure out copay.

## 2022-06-26 NOTE — Telephone Encounter (Signed)
[facility] Raquel Sarna) Delivering Dysport  300 units on 07/01/22.

## 2022-07-07 ENCOUNTER — Ambulatory Visit: Payer: Medicare Other | Admitting: Neurology

## 2022-07-07 ENCOUNTER — Other Ambulatory Visit: Payer: Self-pay | Admitting: Internal Medicine

## 2022-07-07 ENCOUNTER — Encounter: Payer: Self-pay | Admitting: Neurology

## 2022-07-07 VITALS — BP 137/82 | HR 76

## 2022-07-07 DIAGNOSIS — G5132 Clonic hemifacial spasm, left: Secondary | ICD-10-CM

## 2022-07-07 DIAGNOSIS — N951 Menopausal and female climacteric states: Secondary | ICD-10-CM

## 2022-07-07 MED ORDER — ABOBOTULINUMTOXINA 300 UNITS IM SOLR: 300.0000 [IU] | Freq: Once | INTRAMUSCULAR | Status: DC

## 2022-07-07 NOTE — Progress Notes (Signed)
Dysport 300 units  XAF:[id number] [id number] Exp: 12/31/2023

## 2022-07-07 NOTE — Progress Notes (Signed)
PATIENT: Patricia Knox  DOB: Feb 26, 1936  Chief Complaint  Patient presents with   Hemifacial Spasms    Dysport 300 units x 1 vial - office supply     HISTORICAL  Patricia Knox is a 87 year old right-handed female, seen in refer by her primary care doctor  [name] for continued EMG guided injection for her left hemifacial spasm.  She had a gradual onset left hemifacial spasm for many years,  She began to receive EMG guided incobotulism toxin a by Dr. Sherley Bounds at [facility] works very well for her, last injection was in July 2017,used total 27.5 units, but because of the insurance reason she has to switch to different practice,she hopes to continue injection through our office.  She had a history of restless leg syndrome, complains of frequent bilateral calf muscle cramping, increased recently, woke her up few times each night, despite taking Requip 0.5 milligrams twice a day,  UPDATE Dec 27th 2017: She came in for EMG guided xeomin injection for left hemifacial spasm today, potential side effect pain  UPDATE September 25 2016: She had suboptimal response to previous injection, noticed left cheek area twitching, no significant side effect noticed,  Gabapentin 100 mg 3 tablets every night has worked well for her nighttime leg cramping.  UPDATE January 07 2017:  she responded very well to previous injection, no significant side effect noted  UPDATE Apr 15 2017:  She responded very well to previous injection, noticed recurrent left eye twitching, left cheek muscle twitching now  Update July 20, 2017: She responded well to previous injection, there was no significant side effect noticed, only recent few weeks she noticed recurrent frequent left eye twitching, involving left cheek as well,  UPDATE Oct 20 2017: She responded very well to previous injection  UPDATE January 12 2018: She responded well to previous injection,   UPDATE Apr 14 2018: She did very  well with previous injection, this time will use Botox a 100 units  UPDATE Jul 15 2018: She did well with previous injection  UPDATE November 10 2018: She responded well to previous injections.  UPDATE Oct 25 2019: She did well, no significant side effect noted.  UPDATE May 09 2020: She had long interview from previous injection, complains of worsening recurrent left hemifacial muscle spasm  UPDATE October 31 2020: It has been 6 months since previous injection due to insurance reasons, she has to switch to Dysport 300 units per insurance, previously only used Xeomin 50 units, she not noticed frequent left hemifacial muscle spasm to the point of closing her left eye sometimes  UPDATE January 30 2021: She responded well to previous injection,   Update May 01, 2021: She responded well to previous injection, no significant side effect noted  UPDATE September 24 2021: She responded well to injection, did notice facial asymmetry, low-lying left nasolabial fold  Update February 26, 2022 This is a 42-monthfrom previous injection, she noticed increased left facial muscle spasm  UPDATE Jul 07 2022: She tolerated previous injection, did help her muscle spasm, has facial asymmetry, low-lying left nasolabial fold  PHYSICAL EXAM   Vitals:   04/15/17 1350  BP: 124/77  Pulse: 68  Weight: 155 lb (70.3 kg)  Height: '5\' 2"'$  (1.575 m)   Body mass index is 28.35 kg/m.    She has occasionally left hemifacial spasm, involving left orbicularis oculi, occasional nvolvement of left cheek, left frontalis, and the orbicularis auris, platysmas muscles. Mild asymmetry of  left face, droopiness of left face     ASSESSMENT AND PLAN  Patricia Knox is a 87 y.o. female    Left hemifacial spasm Dysport 300 units units into 1.5 cc normal saline,   0.25 cc at each injection site (13 x0.05 cc=0.65cc =130 units)     Marcial Pacas, M.D. Ph.D.  [facility] [address], [place],  [place] [address] Ph: [phone] Fax: [phone]  CC: [name], MD

## 2022-07-13 ENCOUNTER — Other Ambulatory Visit: Payer: Self-pay | Admitting: Neurology

## 2022-07-13 ENCOUNTER — Other Ambulatory Visit: Payer: Self-pay | Admitting: Internal Medicine

## 2022-07-14 NOTE — Telephone Encounter (Signed)
Rx filled

## 2022-07-15 ENCOUNTER — Ambulatory Visit (INDEPENDENT_AMBULATORY_CARE_PROVIDER_SITE_OTHER): Payer: Medicare Other

## 2022-07-15 DIAGNOSIS — M8589 Other specified disorders of bone density and structure, multiple sites: Secondary | ICD-10-CM | POA: Diagnosis not present

## 2022-07-15 DIAGNOSIS — N951 Menopausal and female climacteric states: Secondary | ICD-10-CM

## 2022-08-11 ENCOUNTER — Telehealth: Payer: Self-pay | Admitting: Neurology

## 2022-08-11 NOTE — Telephone Encounter (Signed)
Pt scheduled for Dysport injection for 10/01/22 and will need a new auth before appointment, current auth on file expires 08/29/22

## 2022-08-13 ENCOUNTER — Ambulatory Visit: Payer: Medicare Other | Admitting: Neurology

## 2022-09-03 ENCOUNTER — Other Ambulatory Visit: Payer: Self-pay | Admitting: Family

## 2022-09-07 ENCOUNTER — Other Ambulatory Visit: Payer: Self-pay | Admitting: Family

## 2022-09-11 ENCOUNTER — Other Ambulatory Visit (HOSPITAL_COMMUNITY): Payer: Self-pay

## 2022-09-11 NOTE — Telephone Encounter (Signed)
We have [employer] Medicare ID: [id number], just ran it and got verified back!

## 2022-09-11 NOTE — Telephone Encounter (Signed)
Can you verify insurance-Did not see a current insurance card in chart and could not pull it from my eligibility checker-per epic looks like the [facility] has elapsed. Thanks.

## 2022-09-12 NOTE — Telephone Encounter (Signed)
Buy and Bill:   

## 2022-10-01 ENCOUNTER — Ambulatory Visit: Payer: Medicare Other | Admitting: Neurology

## 2022-10-01 VITALS — BP 157/91

## 2022-10-01 DIAGNOSIS — G5132 Clonic hemifacial spasm, left: Secondary | ICD-10-CM | POA: Diagnosis not present

## 2022-10-01 MED ORDER — ABOBOTULINUMTOXINA 500 UNITS IM SOLR
500.0000 [IU] | Freq: Once | INTRAMUSCULAR | Status: DC
Start: 2022-10-01 — End: 2023-04-06

## 2022-10-01 NOTE — Progress Notes (Signed)
Dysport 500units x 2.5 vial  [id number] [id number] Exp-06/01/2024 B/B or S/P Bacteriostatic 0.9% Sodium Chloride- 2.5 mL  Lot: [id number] Expiration: 04/2024 NDC: 28413-244-01 Dx: U27.25 WITNESSED DG:[id number] martin rn

## 2022-10-01 NOTE — Progress Notes (Signed)
PATIENT: Patricia Knox  DOB: Feb 26, 1936  Chief Complaint  Patient presents with   Hemifacial Spasms    Dysport 300 units x 1 vial - office supply     HISTORICAL  Patricia Knox is a 87 year old right-handed female, seen in refer by her primary care doctor  [name] for continued EMG guided injection for her left hemifacial spasm.  She had a gradual onset left hemifacial spasm for many years,  She began to receive EMG guided incobotulism toxin a by Dr. Sherley Bounds at [facility] works very well for her, last injection was in July 2017,used total 27.5 units, but because of the insurance reason she has to switch to different practice,she hopes to continue injection through our office.  She had a history of restless leg syndrome, complains of frequent bilateral calf muscle cramping, increased recently, woke her up few times each night, despite taking Requip 0.5 milligrams twice a day,  UPDATE Dec 27th 2017: She came in for EMG guided xeomin injection for left hemifacial spasm today, potential side effect pain  UPDATE September 25 2016: She had suboptimal response to previous injection, noticed left cheek area twitching, no significant side effect noticed,  Gabapentin 100 mg 3 tablets every night has worked well for her nighttime leg cramping.  UPDATE January 07 2017:  she responded very well to previous injection, no significant side effect noted  UPDATE Apr 15 2017:  She responded very well to previous injection, noticed recurrent left eye twitching, left cheek muscle twitching now  Update July 20, 2017: She responded well to previous injection, there was no significant side effect noticed, only recent few weeks she noticed recurrent frequent left eye twitching, involving left cheek as well,  UPDATE Oct 20 2017: She responded very well to previous injection  UPDATE January 12 2018: She responded well to previous injection,   UPDATE Apr 14 2018: She did very  well with previous injection, this time will use Botox a 100 units  UPDATE Jul 15 2018: She did well with previous injection  UPDATE November 10 2018: She responded well to previous injections.  UPDATE Oct 25 2019: She did well, no significant side effect noted.  UPDATE May 09 2020: She had long interview from previous injection, complains of worsening recurrent left hemifacial muscle spasm  UPDATE October 31 2020: It has been 6 months since previous injection due to insurance reasons, she has to switch to Dysport 300 units per insurance, previously only used Xeomin 50 units, she not noticed frequent left hemifacial muscle spasm to the point of closing her left eye sometimes  UPDATE January 30 2021: She responded well to previous injection,   Update May 01, 2021: She responded well to previous injection, no significant side effect noted  UPDATE September 24 2021: She responded well to injection, did notice facial asymmetry, low-lying left nasolabial fold  Update February 26, 2022 This is a 42-monthfrom previous injection, she noticed increased left facial muscle spasm  UPDATE Jul 07 2022: She tolerated previous injection, did help her muscle spasm, has facial asymmetry, low-lying left nasolabial fold  PHYSICAL EXAM   Vitals:   04/15/17 1350  BP: 124/77  Pulse: 68  Weight: 155 lb (70.3 kg)  Height: '5\' 2"'$  (1.575 m)   Body mass index is 28.35 kg/m.    She has occasionally left hemifacial spasm, involving left orbicularis oculi, occasional nvolvement of left cheek, left frontalis, and the orbicularis auris, platysmas muscles. Mild asymmetry of  left face, droopiness of left face     ASSESSMENT AND PLAN  Patricia Knox is a 87 y.o. female    Left hemifacial spasm Dysport 300 units units into 1.5 cc normal saline,   0.05 cc at each injection site (10 x0.05 cc=0.5cc =100 units)     Levert Feinstein, M.D. Ph.D.  [facility] [address], [address] [place],  [place] [address] Ph: [phone] Fax: [phone]  CC: Margaretann Loveless, MD

## 2022-10-02 ENCOUNTER — Encounter: Payer: Self-pay | Admitting: Internal Medicine

## 2022-10-02 ENCOUNTER — Ambulatory Visit (INDEPENDENT_AMBULATORY_CARE_PROVIDER_SITE_OTHER): Payer: Medicare Other | Admitting: Internal Medicine

## 2022-10-02 VITALS — BP 130/72 | HR 72 | Ht 62.0 in | Wt 144.6 lb

## 2022-10-02 DIAGNOSIS — L989 Disorder of the skin and subcutaneous tissue, unspecified: Secondary | ICD-10-CM | POA: Insufficient documentation

## 2022-10-02 DIAGNOSIS — E785 Hyperlipidemia, unspecified: Secondary | ICD-10-CM | POA: Insufficient documentation

## 2022-10-02 DIAGNOSIS — I10 Essential (primary) hypertension: Secondary | ICD-10-CM | POA: Insufficient documentation

## 2022-10-02 DIAGNOSIS — Z1231 Encounter for screening mammogram for malignant neoplasm of breast: Secondary | ICD-10-CM | POA: Diagnosis not present

## 2022-10-02 DIAGNOSIS — G5132 Clonic hemifacial spasm, left: Secondary | ICD-10-CM | POA: Diagnosis not present

## 2022-10-02 DIAGNOSIS — E782 Mixed hyperlipidemia: Secondary | ICD-10-CM | POA: Insufficient documentation

## 2022-10-02 DIAGNOSIS — R7302 Impaired glucose tolerance (oral): Secondary | ICD-10-CM | POA: Diagnosis not present

## 2022-10-02 NOTE — Progress Notes (Signed)
Established Patient Office Visit  Subjective:  Patient ID: Patricia Knox, female    DOB: June 27, 1935  Age: 87 y.o. MRN: [id number]  Chief Complaint  Patient presents with   Follow-up    4 month follow up    Patient comes in for her follow-up today.  She is generally feeling well and has no new complaints.   Patient is fasting today for her blood work. She is due for her mammogram as well as a dermatology follow-up.    No other concerns at this time.   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   COLONOSCOPY  2002   TOE SURGERY  1999    Social History   Socioeconomic History   Marital status: Single    Spouse name: Not on file   Number of children: 3   Years of education: HS   Highest education level: Not on file  Occupational History   Occupation: Retired  Tobacco Use   Smoking status: Never   Smokeless tobacco: Never  Substance and Sexual Activity   Alcohol use: No   Drug use: No   Sexual activity: Not on file  Other Topics Concern   Not on file  Social History Narrative    Lives at home with husband.   Right-handed.   No daily caffeine use.   Social Determinants of Health   Financial Resource Strain: Not on file  Food Insecurity: Not on file  Transportation Needs: Not on file  Physical Activity: Not on file  Stress: Not on file  Social Connections: Not on file  Intimate Partner Violence: Not on file    Family History  Problem Relation Age of Onset   Stroke Mother    Heart attack Father    Breast cancer Neg Hx     No Known Allergies  Review of Systems  Constitutional:  Negative for chills, diaphoresis, fever, malaise/fatigue and weight loss.  HENT:  Negative for congestion, hearing loss and sore throat.   Eyes: Negative.   Respiratory:  Negative for cough and shortness of breath.   Cardiovascular:  Negative for chest pain, palpitations and leg swelling.  Gastrointestinal: Negative.   Genitourinary: Negative.   Musculoskeletal: Negative.   Neurological: Negative.   Psychiatric/Behavioral: Negative.         Objective:   BP 130/72   Pulse 72   Ht 5\' 2"  (1.575 m)   Wt 144 lb 9.6 oz (65.6 kg)   SpO2 97%   BMI 26.45 kg/m   Vitals:  10/02/22 1257  BP: 130/72  Pulse: 72  Height: 5\' 2"  (1.575 m)  Weight: 144 lb 9.6 oz (65.6 kg)  SpO2: 97%  BMI (Calculated): 26.44    Physical Exam Vitals and nursing note reviewed.  Constitutional:      Appearance: Normal appearance.  HENT:     Head: Normocephalic.  Cardiovascular:     Rate and Rhythm: Regular rhythm. Tachycardia present.  Pulmonary:     Effort: Pulmonary effort is normal. No respiratory distress.     Breath sounds: Normal breath sounds. No wheezing, rhonchi or rales.  Abdominal:     General: Bowel sounds are normal.     Palpations: Abdomen is soft.  Musculoskeletal:        General: Normal range of motion.     Cervical back: Normal range of motion.  Neurological:     General: No focal deficit present.     Mental Status: She is alert and oriented to person, place, and  time.  Psychiatric:        Mood and Affect: Mood normal.        Behavior: Behavior normal.      No results found for any visits on 10/02/22.  No results found for this or any previous visit (from the past 2160 hour(s)).    Assessment & Plan:  Patient to continue taking all her medications as such.  Fasting labs today.  Schedule mammogram. Problem List Items Addressed This Visit     Hemifacial spasm (Chronic)   Impaired glucose tolerance   Relevant Orders   Hemoglobin A1c   Essential hypertension, benign   Relevant Orders   CBC With Differential   CMP14+EGFR   Mixed hyperlipidemia   Relevant Orders   Lipid Panel w/o Chol/HDL Ratio   Skin lesion of lower extremity   Relevant Orders   Ambulatory referral to Dermatology   Other Visit Diagnoses     Encounter for screening mammogram for malignant neoplasm of breast    -  Primary   Relevant Orders   MM 3D SCREENING MAMMOGRAM BILATERAL BREAST       Return in about 6 months (around 04/04/2023).   Total time spent: 25 minutes  Margaretann Loveless, MD  10/02/2022

## 2022-10-12 ENCOUNTER — Other Ambulatory Visit: Payer: Self-pay | Admitting: Neurology

## 2022-10-12 ENCOUNTER — Other Ambulatory Visit: Payer: Self-pay | Admitting: Internal Medicine

## 2022-10-12 DIAGNOSIS — K219 Gastro-esophageal reflux disease without esophagitis: Secondary | ICD-10-CM

## 2022-10-16 LAB — CMP14+EGFR
ALT: 17 IU/L (ref 0–32)
AST: 26 IU/L (ref 0–40)
Albumin/Globulin Ratio: 1.3 (ref 1.2–2.2)
Albumin: 4 g/dL (ref 3.7–4.7)
Alkaline Phosphatase: 92 IU/L (ref 44–121)
BUN/Creatinine Ratio: 14 (ref 12–28)
BUN: 16 mg/dL (ref 8–27)
Bilirubin Total: 0.3 mg/dL (ref 0.0–1.2)
CO2: 22 mmol/L (ref 20–29)
Calcium: 9.2 mg/dL (ref 8.7–10.3)
Chloride: 100 mmol/L (ref 96–106)
Creatinine, Ser: 1.15 mg/dL — ABNORMAL HIGH (ref 0.57–1.00)
Globulin, Total: 3.1 g/dL (ref 1.5–4.5)
Glucose: 87 mg/dL (ref 70–99)
Potassium: 4.3 mmol/L (ref 3.5–5.2)
Sodium: 140 mmol/L (ref 134–144)
Total Protein: 7.1 g/dL (ref 6.0–8.5)
eGFR: 47 mL/min/{1.73_m2} — ABNORMAL LOW (ref >59)

## 2022-10-16 LAB — CBC WITH DIFFERENTIAL
Basophils Absolute: 0.1 10*3/uL (ref 0.0–0.2)
Basos: 1 %
EOS (ABSOLUTE): 0 10*3/uL (ref 0.0–0.4)
Eos: 1 %
Hematocrit: 38.4 % (ref 34.0–46.6)
Hemoglobin: 12.4 g/dL (ref 11.1–15.9)
Immature Grans (Abs): 0 10*3/uL (ref 0.0–0.1)
Immature Granulocytes: 0 %
Lymphocytes Absolute: 1.8 10*3/uL (ref 0.7–3.1)
Lymphs: 29 %
MCH: 28.8 pg (ref 26.6–33.0)
MCHC: 32.3 g/dL (ref 31.5–35.7)
MCV: 89 fL (ref 79–97)
Monocytes Absolute: 0.6 10*3/uL (ref 0.1–0.9)
Monocytes: 10 %
Neutrophils Absolute: 3.8 10*3/uL (ref 1.4–7.0)
Neutrophils: 59 %
RBC: 4.3 x10E6/uL (ref 3.77–5.28)
RDW: 13.2 % (ref 11.7–15.4)
WBC: 6.3 10*3/uL (ref 3.4–10.8)

## 2022-10-16 LAB — HEMOGLOBIN A1C
Est. average glucose Bld gHb Est-mCnc: 120 mg/dL
Hgb A1c MFr Bld: 5.8 % — ABNORMAL HIGH (ref 4.8–5.6)

## 2022-10-16 LAB — LIPID PANEL W/O CHOL/HDL RATIO
Cholesterol, Total: 155 mg/dL (ref 100–199)
HDL: 64 mg/dL (ref >39)
LDL Chol Calc (NIH): 71 mg/dL (ref 0–99)
Triglycerides: 111 mg/dL (ref 0–149)
VLDL Cholesterol Cal: 20 mg/dL (ref 5–40)

## 2022-11-19 DIAGNOSIS — T63441A Toxic effect of venom of bees, accidental (unintentional), initial encounter: Secondary | ICD-10-CM | POA: Diagnosis not present

## 2022-11-19 DIAGNOSIS — M79641 Pain in right hand: Secondary | ICD-10-CM | POA: Diagnosis not present

## 2022-11-19 DIAGNOSIS — L299 Pruritus, unspecified: Secondary | ICD-10-CM | POA: Diagnosis not present

## 2022-12-29 ENCOUNTER — Other Ambulatory Visit: Payer: Self-pay | Admitting: Neurology

## 2023-01-01 ENCOUNTER — Ambulatory Visit: Payer: Medicare Other | Admitting: Neurology

## 2023-01-01 VITALS — BP 149/81 | HR 80

## 2023-01-01 DIAGNOSIS — G5132 Clonic hemifacial spasm, left: Secondary | ICD-10-CM | POA: Diagnosis not present

## 2023-01-01 MED ORDER — ABOBOTULINUMTOXINA 300 UNITS IM SOLR
300.0000 [IU] | Freq: Once | INTRAMUSCULAR | Status: DC
Start: 2023-01-01 — End: 2023-01-05

## 2023-01-01 NOTE — Progress Notes (Signed)
Dysport 300units x 1 vial  [id number] Exp-04.30.2026 B/B  Bacteriostatic 0.9% Sodium Chloride- 2.5 mL  Lot: [id number] Expiration: 04.01.2025 NDC: [id number] Dx: G51.32 WITNESSED [id number], CMA

## 2023-01-05 DIAGNOSIS — G5132 Clonic hemifacial spasm, left: Secondary | ICD-10-CM | POA: Diagnosis not present

## 2023-01-05 MED ORDER — ABOBOTULINUMTOXINA 300 UNITS IM SOLR
300.0000 [IU] | Freq: Once | INTRAMUSCULAR | Status: AC
Start: 2023-01-05 — End: 2023-01-05
  Administered 2023-01-05: 300 [IU] via INTRAMUSCULAR

## 2023-01-05 NOTE — Progress Notes (Signed)
PATIENT: Patricia Knox  DOB: 1936/02/12  Chief Complaint  Patient presents with   Hemifacial Spasms    Dysport 300 units x 1 vial - office supply     HISTORICAL  [name] is a 87 year old right-handed female, seen in refer by her primary care doctor  Margaretann Loveless for continued EMG guided injection for her left hemifacial spasm.  She had a gradual onset left hemifacial spasm for many years,  She began to receive EMG guided incobotulism toxin a by Dr. Arlice Colt at [facility] works very well for her, last injection was in July 2017,used total 27.5 units, but because of the insurance reason she has to switch to different practice,she hopes to continue injection through our office.  She had a history of restless leg syndrome, complains of frequent bilateral calf muscle cramping, increased recently, woke her up few times each night, despite taking Requip 0.5 milligrams twice a day,  UPDATE Dec 27th 2017: She came in for EMG guided xeomin injection for left hemifacial spasm today, potential side effect pain  UPDATE September 25 2016: She had suboptimal response to previous injection, noticed left cheek area twitching, no significant side effect noticed,  Gabapentin 100 mg 3 tablets every night has worked well for her nighttime leg cramping.  UPDATE January 07 2017:  she responded very well to previous injection, no significant side effect noted  UPDATE Apr 15 2017:  She responded very well to previous injection, noticed recurrent left eye twitching, left cheek muscle twitching now  Update July 20, 2017: She responded well to previous injection, there was no significant side effect noticed, only recent few weeks she noticed recurrent frequent left eye twitching, involving left cheek as well,  UPDATE Oct 20 2017: She responded very well to previous injection  UPDATE January 12 2018: She responded well to previous injection,   UPDATE Apr 14 2018: She did very  well with previous injection, this time will use Botox a 100 units  UPDATE Jul 15 2018: She did well with previous injection  UPDATE November 10 2018: She responded well to previous injections.  UPDATE Oct 25 2019: She did well, no significant side effect noted.  UPDATE May 09 2020: She had long interview from previous injection, complains of worsening recurrent left hemifacial muscle spasm  UPDATE October 31 2020: It has been 6 months since previous injection due to insurance reasons, she has to switch to Dysport 300 units per insurance, previously only used Xeomin 50 units, she not noticed frequent left hemifacial muscle spasm to the point of closing her left eye sometimes  UPDATE January 30 2021: She responded well to previous injection,   Update May 01, 2021: She responded well to previous injection, no significant side effect noted  UPDATE September 24 2021: She responded well to injection, did notice facial asymmetry, low-lying left nasolabial fold  Update February 26, 2022 This is a 71-month from previous injection, she noticed increased left facial muscle spasm  UPDATE Jul 07 2022: She tolerated previous injection, did help her muscle spasm, has facial asymmetry, low-lying left nasolabial fold  UPDATE January 01 2023: Responded well to previous injection,  PHYSICAL EXAM   Vitals:   04/15/17 1350  BP: 124/77  Pulse: 68  Weight: 155 lb (70.3 kg)  Height: 5\' 2"  (1.575 m)   Body mass index is 28.35 kg/m.    She has occasionally left hemifacial spasm, involving left orbicularis oculi, occasional nvolvement of left cheek, left  frontalis, and the orbicularis auris, platysmas muscles. Mild asymmetry of left face, droopiness of left face     ASSESSMENT AND PLAN  [name] is a 87 y.o. female    Left hemifacial spasm Dysport 300 units units into 2 cc of normal saline, (15 units/0.1cc)  0.05 cc at each injection site (10 x0.05 cc= 0.5 cc =75units)     Levert Feinstein, M.D.  Ph.D.  [facility] [address] [place], [place] [address] Ph: [phone] Fax: [phone]  CC: Margaretann Loveless, MD

## 2023-01-07 ENCOUNTER — Other Ambulatory Visit: Payer: Self-pay | Admitting: Internal Medicine

## 2023-01-07 DIAGNOSIS — K219 Gastro-esophageal reflux disease without esophagitis: Secondary | ICD-10-CM

## 2023-01-16 MED ORDER — ABOBOTULINUMTOXINA 300 UNITS IM SOLR
300.0000 [IU] | Freq: Once | INTRAMUSCULAR | Status: AC
Start: 2023-01-16 — End: 2023-01-16
  Administered 2023-01-16: 300 [IU] via INTRAMUSCULAR

## 2023-02-03 DIAGNOSIS — I8312 Varicose veins of left lower extremity with inflammation: Secondary | ICD-10-CM | POA: Diagnosis not present

## 2023-02-03 DIAGNOSIS — I8311 Varicose veins of right lower extremity with inflammation: Secondary | ICD-10-CM | POA: Diagnosis not present

## 2023-02-03 DIAGNOSIS — I872 Venous insufficiency (chronic) (peripheral): Secondary | ICD-10-CM | POA: Diagnosis not present

## 2023-02-15 ENCOUNTER — Other Ambulatory Visit: Payer: Self-pay | Admitting: Internal Medicine

## 2023-02-22 ENCOUNTER — Other Ambulatory Visit: Payer: Self-pay | Admitting: Family

## 2023-02-24 ENCOUNTER — Other Ambulatory Visit: Payer: Self-pay | Admitting: Internal Medicine

## 2023-02-24 DIAGNOSIS — I10 Essential (primary) hypertension: Secondary | ICD-10-CM

## 2023-03-29 ENCOUNTER — Other Ambulatory Visit: Payer: Self-pay | Admitting: Internal Medicine

## 2023-04-01 ENCOUNTER — Ambulatory Visit: Payer: Medicare Other | Admitting: Neurology

## 2023-04-01 VITALS — BP 133/86 | HR 73

## 2023-04-01 DIAGNOSIS — L989 Disorder of the skin and subcutaneous tissue, unspecified: Secondary | ICD-10-CM

## 2023-04-01 DIAGNOSIS — R7302 Impaired glucose tolerance (oral): Secondary | ICD-10-CM

## 2023-04-01 DIAGNOSIS — G5132 Clonic hemifacial spasm, left: Secondary | ICD-10-CM

## 2023-04-01 DIAGNOSIS — I10 Essential (primary) hypertension: Secondary | ICD-10-CM

## 2023-04-01 MED ORDER — ABOBOTULINUMTOXINA 300 UNITS IM SOLR
300.0000 [IU] | Freq: Once | INTRAMUSCULAR | Status: AC
Start: 2023-04-01 — End: 2023-04-01
  Administered 2023-04-01: 300 [IU] via INTRAMUSCULAR

## 2023-04-01 NOTE — Progress Notes (Signed)
PATIENT: Patricia Knox  DOB: 1936/02/12  Chief Complaint  Patient presents with   Hemifacial Spasms    Dysport 300 units x 1 vial - office supply     HISTORICAL  Patricia Knox is a 87 year old right-handed female, seen in refer by her primary care doctor  Margaretann Loveless for continued EMG guided injection for her left hemifacial spasm.  She had a gradual onset left hemifacial spasm for many years,  She began to receive EMG guided incobotulism toxin a by Dr. Arlice Colt at [facility] works very well for her, last injection was in July 2017,used total 27.5 units, but because of the insurance reason she has to switch to different practice,she hopes to continue injection through our office.  She had a history of restless leg syndrome, complains of frequent bilateral calf muscle cramping, increased recently, woke her up few times each night, despite taking Requip 0.5 milligrams twice a day,  UPDATE Dec 27th 2017: She came in for EMG guided xeomin injection for left hemifacial spasm today, potential side effect pain  UPDATE September 25 2016: She had suboptimal response to previous injection, noticed left cheek area twitching, no significant side effect noticed,  Gabapentin 100 mg 3 tablets every night has worked well for her nighttime leg cramping.  UPDATE January 07 2017:  she responded very well to previous injection, no significant side effect noted  UPDATE Apr 15 2017:  She responded very well to previous injection, noticed recurrent left eye twitching, left cheek muscle twitching now  Update July 20, 2017: She responded well to previous injection, there was no significant side effect noticed, only recent few weeks she noticed recurrent frequent left eye twitching, involving left cheek as well,  UPDATE Oct 20 2017: She responded very well to previous injection  UPDATE January 12 2018: She responded well to previous injection,   UPDATE Apr 14 2018: She did very  well with previous injection, this time will use Botox a 100 units  UPDATE Jul 15 2018: She did well with previous injection  UPDATE November 10 2018: She responded well to previous injections.  UPDATE Oct 25 2019: She did well, no significant side effect noted.  UPDATE May 09 2020: She had long interview from previous injection, complains of worsening recurrent left hemifacial muscle spasm  UPDATE October 31 2020: It has been 6 months since previous injection due to insurance reasons, she has to switch to Dysport 300 units per insurance, previously only used Xeomin 50 units, she not noticed frequent left hemifacial muscle spasm to the point of closing her left eye sometimes  UPDATE January 30 2021: She responded well to previous injection,   Update May 01, 2021: She responded well to previous injection, no significant side effect noted  UPDATE September 24 2021: She responded well to injection, did notice facial asymmetry, low-lying left nasolabial fold  Update February 26, 2022 This is a 71-month from previous injection, she noticed increased left facial muscle spasm  UPDATE Jul 07 2022: She tolerated previous injection, did help her muscle spasm, has facial asymmetry, low-lying left nasolabial fold  UPDATE January 01 2023: Responded well to previous injection,  PHYSICAL EXAM   Vitals:   04/15/17 1350  BP: 124/77  Pulse: 68  Weight: 155 lb (70.3 kg)  Height: 5\' 2"  (1.575 m)   Body mass index is 28.35 kg/m.    She has occasionally left hemifacial spasm, involving left orbicularis oculi, occasional nvolvement of left cheek, left  frontalis, and the orbicularis auris, platysmas muscles. Mild asymmetry of left face, droopiness of left face     ASSESSMENT AND PLAN  Patricia Knox is a 87 y.o. female    Left hemifacial spasm Dysport 300 units units into 2 cc of normal saline, (15 units/0.1cc)  0.05 cc at each injection site (10 x0.05 cc= 0.5 cc =75units)     Levert Feinstein, M.D.  Ph.D.  [facility] [address] [place], [place] [address] Ph: [phone] Fax: [phone]  CC: Margaretann Loveless, MD

## 2023-04-01 NOTE — Progress Notes (Signed)
Dysport 150units x 1 vial  [id number] Exp-04.30.2026 B/B  Bacteriostatic 0.9% Sodium Chloride- 1 mL  Lot: [id number] Expiration: 04.01.2025 NDC: 5621308657 Dx: G51.32 WITNESSED QI:ONGEXBM, CMA

## 2023-04-03 ENCOUNTER — Ambulatory Visit: Payer: Medicare Other | Admitting: Cardiology

## 2023-04-06 ENCOUNTER — Other Ambulatory Visit: Payer: Self-pay | Admitting: Internal Medicine

## 2023-04-06 ENCOUNTER — Encounter: Payer: Self-pay | Admitting: Cardiology

## 2023-04-06 ENCOUNTER — Ambulatory Visit (INDEPENDENT_AMBULATORY_CARE_PROVIDER_SITE_OTHER): Payer: Medicare Other | Admitting: Cardiology

## 2023-04-06 VITALS — BP 128/78 | HR 96 | Ht 62.0 in | Wt 144.0 lb

## 2023-04-06 DIAGNOSIS — E782 Mixed hyperlipidemia: Secondary | ICD-10-CM | POA: Diagnosis not present

## 2023-04-06 DIAGNOSIS — R7303 Prediabetes: Secondary | ICD-10-CM | POA: Diagnosis not present

## 2023-04-06 DIAGNOSIS — I10 Essential (primary) hypertension: Secondary | ICD-10-CM | POA: Diagnosis not present

## 2023-04-06 DIAGNOSIS — K219 Gastro-esophageal reflux disease without esophagitis: Secondary | ICD-10-CM

## 2023-04-06 NOTE — Progress Notes (Signed)
Established Patient Office Visit  Subjective:  Patient ID: Patricia Knox, female    DOB: [birth year]  Age: 87 y.o. MRN: [id number]  Chief Complaint  Patient presents with   Follow-up    Patient in office for 6 month follow up. Patient reports feeling well, no complaints today.  Patient did not have blood work done and is not fasting today. Return for fasting lab work.  Patient did not have mammogram done. Phone number given to patient to call to schedule.     No other concerns at this time.   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   COLONOSCOPY  2002   TOE SURGERY  1999    Social History   Socioeconomic History   Marital status: Single    Spouse name: Not on file   Number of children: 3   Years of education: HS   Highest education level: Not on file  Occupational History   Occupation: Retired  Tobacco Use   Smoking status: Never   Smokeless tobacco: Never  Substance and Sexual Activity   Alcohol use: No   Drug use: No   Sexual activity: Not on file  Other Topics Concern    Not on file  Social History Narrative   Lives at home with husband.   Right-handed.   No daily caffeine use.   Social Determinants of Health   Financial Resource Strain: Not on file  Food Insecurity: Not on file  Transportation Needs: Not on file  Physical Activity: Not on file  Stress: Not on file  Social Connections: Not on file  Intimate Partner Violence: Not on file    Family History  Problem Relation Age of Onset   Stroke Mother    Heart attack Father    Breast cancer Neg Hx     No Known Allergies  Review of Systems  Constitutional: Negative.   HENT: Negative.    Eyes: Negative.   Respiratory: Negative.  Negative for shortness of breath.   Cardiovascular: Negative.  Negative for chest pain.  Gastrointestinal: Negative.  Negative for abdominal pain, constipation and diarrhea.  Genitourinary: Negative.   Musculoskeletal:  Negative for joint pain and myalgias.  Skin: Negative.   Neurological: Negative.  Negative for dizziness and headaches.  Endo/Heme/Allergies: Negative.   All other systems reviewed and are negative.      Objective:   BP 128/78 (BP Location: Left Arm, Patient Position: Sitting, Cuff Size: Small)   Pulse 96   Ht 5\' 2"  (1.575 m)  Wt 144 lb (65.3 kg)   BMI 26.34 kg/m   Vitals:   04/06/23 1541  BP: 128/78  Pulse: 96  Height: 5\' 2"  (1.575 m)  Weight: 144 lb (65.3 kg)  BMI (Calculated): 26.33    Physical Exam Vitals and nursing note reviewed.  Constitutional:      Appearance: Normal appearance. She is normal weight.  HENT:     Head: Normocephalic and atraumatic.     Nose: Nose normal.     Mouth/Throat:     Mouth: Mucous membranes are moist.  Eyes:     Extraocular Movements: Extraocular movements intact.     Conjunctiva/sclera: Conjunctivae normal.     Pupils: Pupils are equal, round, and reactive to light.  Cardiovascular:     Rate and Rhythm: Normal rate and regular rhythm.     Pulses: Normal pulses.     Heart sounds: Normal  heart sounds.  Pulmonary:     Effort: Pulmonary effort is normal.     Breath sounds: Normal breath sounds.  Abdominal:     General: Abdomen is flat. Bowel sounds are normal.     Palpations: Abdomen is soft.  Musculoskeletal:        General: Normal range of motion.     Cervical back: Normal range of motion.  Skin:    General: Skin is warm and dry.  Neurological:     General: No focal deficit present.     Mental Status: She is alert and oriented to person, place, and time.  Psychiatric:        Mood and Affect: Mood normal.        Behavior: Behavior normal.        Thought Content: Thought content normal.        Judgment: Judgment normal.      No results found for any visits on 04/06/23.  No results found for this or any previous visit (from the past 2160 hour(s)).    Assessment & Plan:  Return for fasting lab work.  Schedule mammogram.   Problem List Items Addressed This Visit       Cardiovascular and Mediastinum   Essential hypertension, benign - Primary     Other   Mixed hyperlipidemia   Prediabetes    Return in about 6 months (around 10/04/2023) for with NK.   Total time spent: 25 minutes  [employer], NP  04/06/2023   This document may have been prepared by Dragon Voice Recognition software and as such may include unintentional dictation errors.

## 2023-04-06 NOTE — Patient Instructions (Signed)
 [facility] [address], [address] [place],  [place]  [address]  Main: [phone]

## 2023-04-08 ENCOUNTER — Other Ambulatory Visit: Payer: Self-pay | Admitting: Neurology

## 2023-05-10 ENCOUNTER — Other Ambulatory Visit: Payer: Self-pay | Admitting: Internal Medicine

## 2023-05-10 DIAGNOSIS — F411 Generalized anxiety disorder: Secondary | ICD-10-CM

## 2023-05-14 ENCOUNTER — Other Ambulatory Visit: Payer: Self-pay | Admitting: Internal Medicine

## 2023-05-21 ENCOUNTER — Other Ambulatory Visit: Payer: Self-pay | Admitting: Internal Medicine

## 2023-05-24 ENCOUNTER — Other Ambulatory Visit: Payer: Self-pay | Admitting: Internal Medicine

## 2023-05-24 DIAGNOSIS — G2581 Restless legs syndrome: Secondary | ICD-10-CM

## 2023-06-01 ENCOUNTER — Other Ambulatory Visit: Payer: Self-pay | Admitting: Internal Medicine

## 2023-06-01 DIAGNOSIS — G2581 Restless legs syndrome: Secondary | ICD-10-CM

## 2023-06-05 ENCOUNTER — Ambulatory Visit
Admission: RE | Admit: 2023-06-05 | Discharge: 2023-06-05 | Disposition: A | Discharge status: Home / Self Care | Payer: Medicare Other | Source: Ambulatory Visit | Attending: Internal Medicine | Admitting: Internal Medicine

## 2023-06-05 DIAGNOSIS — Z1231 Encounter for screening mammogram for malignant neoplasm of breast: Secondary | ICD-10-CM | POA: Insufficient documentation

## 2023-06-28 ENCOUNTER — Other Ambulatory Visit: Payer: Self-pay | Admitting: Family

## 2023-07-01 ENCOUNTER — Other Ambulatory Visit: Payer: Self-pay | Admitting: Internal Medicine

## 2023-07-01 DIAGNOSIS — K219 Gastro-esophageal reflux disease without esophagitis: Secondary | ICD-10-CM

## 2023-07-06 ENCOUNTER — Other Ambulatory Visit: Payer: Self-pay | Admitting: Neurology

## 2023-07-08 ENCOUNTER — Ambulatory Visit: Payer: Medicare Other | Admitting: Neurology

## 2023-07-08 VITALS — BP 128/93

## 2023-07-08 DIAGNOSIS — G5132 Clonic hemifacial spasm, left: Secondary | ICD-10-CM | POA: Diagnosis not present

## 2023-07-08 MED ORDER — ABOBOTULINUMTOXINA 300 UNITS IM SOLR
300.0000 [IU] | Freq: Once | INTRAMUSCULAR | Status: AC
  Administered 2023-07-08: 300 [IU] via INTRAMUSCULAR

## 2023-07-08 NOTE — Progress Notes (Signed)
 PATIENT: Patricia Knox  DOB: 29-Jul-1935  Chief Complaint  Patient presents with   Hemifacial Spasms    Dysport  300 units x 1 vial - office supply     HISTORICAL  Patricia Knox is a 88 year old right-handed female, seen in refer by her primary care doctor  Fredy GORMAN Bathe for continued EMG guided injection for her left hemifacial spasm.  She had a gradual onset left hemifacial spasm for many years,  She began to receive EMG guided incobotulism toxin a by Dr. Ozell Loyal at [facility] works very well for her, last injection was in July 2017,used total 27.5 units, but because of the insurance reason she has to switch to different practice,she hopes to continue injection through our office.  She had a history of restless leg syndrome, complains of frequent bilateral calf muscle cramping, increased recently, woke her up few times each night, despite taking Requip  0.5 milligrams twice a day,  UPDATE Dec 27th 2017: She came in for EMG guided xeomin  injection for left hemifacial spasm today, potential side effect pain  UPDATE September 25 2016: She had suboptimal response to previous injection, noticed left cheek area twitching, no significant side effect noticed,  Gabapentin  100 mg 3 tablets every night has worked well for her nighttime leg cramping.  UPDATE January 07 2017:  she responded very well to previous injection, no significant side effect noted  UPDATE Apr 15 2017:  She responded very well to previous injection, noticed recurrent left eye twitching, left cheek muscle twitching now  Update July 20, 2017: She responded well to previous injection, there was no significant side effect noticed, only recent few weeks she noticed recurrent frequent left eye twitching, involving left cheek as well,  UPDATE Oct 20 2017: She responded very well to previous injection  UPDATE January 12 2018: She responded well to previous injection,   UPDATE Apr 14 2018: She did very  well with previous injection, this time will use Botox  a 100 units  UPDATE Jul 15 2018: She did well with previous injection  UPDATE November 10 2018: She responded well to previous injections.  UPDATE Oct 25 2019: She did well, no significant side effect noted.  UPDATE May 09 2020: She had long interview from previous injection, complains of worsening recurrent left hemifacial muscle spasm  UPDATE October 31 2020: It has been 6 months since previous injection due to insurance reasons, she has to switch to Dysport  300 units per insurance, previously only used Xeomin  50 units, she not noticed frequent left hemifacial muscle spasm to the point of closing her left eye sometimes  UPDATE January 30 2021: She responded well to previous injection,   Update May 01, 2021: She responded well to previous injection, no significant side effect noted  UPDATE September 24 2021: She responded well to injection, did notice facial asymmetry, low-lying left nasolabial fold  Update February 26, 2022 This is a 55-month from previous injection, she noticed increased left facial muscle spasm  UPDATE Jul 07 2022: She tolerated previous injection, did help her muscle spasm, has facial asymmetry, low-lying left nasolabial fold  UPDATE January 01 2023: Responded well to previous injection,  PHYSICAL EXAM   Vitals:   04/15/17 1350  BP: 124/77  Pulse: 68  Weight: 155 lb (70.3 kg)  Height: 5' 2 (1.575 m)   Body mass index is 28.35 kg/m.    She has occasionally left hemifacial spasm, involving left orbicularis oculi, occasional nvolvement of left cheek, left  frontalis, and the orbicularis auris, platysmas muscles. Mild asymmetry of left face, droopiness of left face     ASSESSMENT AND PLAN  Patricia Knox is a 88 y.o. female    Left hemifacial spasm Dysport  300 units units into 1.5 cc of normal saline, (20 units/0.1cc)  0.05 cc at each injection site (10 x0.05 cc= 10 units =100units)     Modena Callander,  M.D. Ph.D.  [facility] [address] [place], [place] [address] Ph: [phone] Fax: [phone]  CC: Fredy GORMAN Bathe, MD

## 2023-07-08 NOTE — Progress Notes (Signed)
Dysport 300units x 1 vial  [id number] Exp-11/29/24 B/B  Bacteriostatic 0.9% Sodium Chloride- 1.5 mL  Lot: [id number] Expiration: 04/02/24 NDC: 2536644034 Dx:  V42.59  WITNESSED DG:LOVFI Yetta Barre RN

## 2023-07-10 DIAGNOSIS — I872 Venous insufficiency (chronic) (peripheral): Secondary | ICD-10-CM | POA: Diagnosis not present

## 2023-07-21 DIAGNOSIS — R109 Unspecified abdominal pain: Secondary | ICD-10-CM | POA: Diagnosis not present

## 2023-07-21 DIAGNOSIS — N390 Urinary tract infection, site not specified: Secondary | ICD-10-CM | POA: Diagnosis not present

## 2023-07-21 DIAGNOSIS — R1011 Right upper quadrant pain: Secondary | ICD-10-CM | POA: Diagnosis not present

## 2023-07-23 ENCOUNTER — Emergency Department: Payer: Medicare Other

## 2023-07-23 ENCOUNTER — Observation Stay: Payer: Medicare Other

## 2023-07-23 ENCOUNTER — Observation Stay
Admission: EM | Admit: 2023-07-23 | Discharge: 2023-07-24 | Disposition: A | Discharge status: Home / Self Care | Payer: Medicare Other | Attending: Internal Medicine | Admitting: Internal Medicine

## 2023-07-23 ENCOUNTER — Other Ambulatory Visit: Payer: Self-pay

## 2023-07-23 ENCOUNTER — Observation Stay
Admit: 2023-07-23 | Discharge: 2023-07-23 | Disposition: A | Discharge status: Home / Self Care | Payer: Medicare Other | Attending: Internal Medicine

## 2023-07-23 DIAGNOSIS — J90 Pleural effusion, not elsewhere classified: Secondary | ICD-10-CM | POA: Diagnosis not present

## 2023-07-23 DIAGNOSIS — I82432 Acute embolism and thrombosis of left popliteal vein: Secondary | ICD-10-CM | POA: Diagnosis not present

## 2023-07-23 DIAGNOSIS — Z853 Personal history of malignant neoplasm of breast: Secondary | ICD-10-CM | POA: Diagnosis not present

## 2023-07-23 DIAGNOSIS — I2699 Other pulmonary embolism without acute cor pulmonale: Secondary | ICD-10-CM | POA: Insufficient documentation

## 2023-07-23 DIAGNOSIS — R918 Other nonspecific abnormal finding of lung field: Secondary | ICD-10-CM | POA: Diagnosis not present

## 2023-07-23 DIAGNOSIS — I82812 Embolism and thrombosis of superficial veins of left lower extremities: Secondary | ICD-10-CM | POA: Diagnosis not present

## 2023-07-23 DIAGNOSIS — R0789 Other chest pain: Secondary | ICD-10-CM | POA: Diagnosis not present

## 2023-07-23 DIAGNOSIS — I517 Cardiomegaly: Secondary | ICD-10-CM | POA: Diagnosis not present

## 2023-07-23 DIAGNOSIS — I2609 Other pulmonary embolism with acute cor pulmonale: Secondary | ICD-10-CM | POA: Diagnosis not present

## 2023-07-23 DIAGNOSIS — I10 Essential (primary) hypertension: Secondary | ICD-10-CM | POA: Diagnosis not present

## 2023-07-23 DIAGNOSIS — I82402 Acute embolism and thrombosis of unspecified deep veins of left lower extremity: Secondary | ICD-10-CM | POA: Insufficient documentation

## 2023-07-23 DIAGNOSIS — I82412 Acute embolism and thrombosis of left femoral vein: Secondary | ICD-10-CM | POA: Diagnosis not present

## 2023-07-23 DIAGNOSIS — I2694 Multiple subsegmental pulmonary emboli without acute cor pulmonale: Principal | ICD-10-CM | POA: Diagnosis present

## 2023-07-23 DIAGNOSIS — J9811 Atelectasis: Secondary | ICD-10-CM | POA: Diagnosis not present

## 2023-07-23 DIAGNOSIS — R079 Chest pain, unspecified: Secondary | ICD-10-CM | POA: Diagnosis not present

## 2023-07-23 DIAGNOSIS — I2510 Atherosclerotic heart disease of native coronary artery without angina pectoris: Secondary | ICD-10-CM | POA: Diagnosis not present

## 2023-07-23 DIAGNOSIS — I824Y2 Acute embolism and thrombosis of unspecified deep veins of left proximal lower extremity: Secondary | ICD-10-CM | POA: Diagnosis not present

## 2023-07-23 DIAGNOSIS — Z7901 Long term (current) use of anticoagulants: Secondary | ICD-10-CM | POA: Diagnosis not present

## 2023-07-23 DIAGNOSIS — E785 Hyperlipidemia, unspecified: Secondary | ICD-10-CM | POA: Diagnosis not present

## 2023-07-23 DIAGNOSIS — Z79899 Other long term (current) drug therapy: Secondary | ICD-10-CM | POA: Insufficient documentation

## 2023-07-23 LAB — CBC
HCT: 37.1 % (ref 36.0–46.0)
Hemoglobin: 12.3 g/dL (ref 12.0–15.0)
MCH: 29.5 pg (ref 26.0–34.0)
MCHC: 33.2 g/dL (ref 30.0–36.0)
MCV: 89 fL (ref 80.0–100.0)
Platelets: 278 10*3/uL (ref 150–400)
RBC: 4.17 MIL/uL (ref 3.87–5.11)
RDW: 13.4 % (ref 11.5–15.5)
WBC: 10.7 10*3/uL — ABNORMAL HIGH (ref 4.0–10.5)
nRBC: 0 % (ref 0.0–0.2)

## 2023-07-23 LAB — ECHOCARDIOGRAM COMPLETE
AR max vel: 1.94 cm2
AV Area VTI: 1.67 cm2
AV Area mean vel: 1.49 cm2
AV Mean grad: 9 mm[Hg]
AV Peak grad: 16.1 mm[Hg]
Ao pk vel: 2.01 m/s
Area-P 1/2: 5.2 cm2
Height: 62 in
MV VTI: 2.29 cm2
S' Lateral: 2.3 cm
Weight: 2320 [oz_av]

## 2023-07-23 LAB — URINALYSIS, ROUTINE W REFLEX MICROSCOPIC
Bilirubin Urine: NEGATIVE
Glucose, UA: NEGATIVE mg/dL
Hgb urine dipstick: NEGATIVE
Ketones, ur: 5 mg/dL — AB
Nitrite: NEGATIVE
Protein, ur: NEGATIVE mg/dL
Specific Gravity, Urine: 1.011 (ref 1.005–1.030)
pH: 7 (ref 5.0–8.0)

## 2023-07-23 LAB — PROTIME-INR
INR: 1 (ref 0.8–1.2)
Prothrombin Time: 13.5 s (ref 11.4–15.2)

## 2023-07-23 LAB — PROCALCITONIN: Procalcitonin: <0.1 ng/mL

## 2023-07-23 LAB — HEPATIC FUNCTION PANEL
ALT: 19 U/L (ref 0–44)
AST: 27 U/L (ref 15–41)
Albumin: 3.2 g/dL — ABNORMAL LOW (ref 3.5–5.0)
Alkaline Phosphatase: 79 U/L (ref 38–126)
Bilirubin, Direct: 0.2 mg/dL (ref 0.0–0.2)
Indirect Bilirubin: 0.9 mg/dL (ref 0.3–0.9)
Total Bilirubin: 1.1 mg/dL (ref 0.0–1.2)
Total Protein: 7.3 g/dL (ref 6.5–8.1)

## 2023-07-23 LAB — BASIC METABOLIC PANEL
Anion gap: 13 (ref 5–15)
BUN: 12 mg/dL (ref 8–23)
CO2: 24 mmol/L (ref 22–32)
Calcium: 9 mg/dL (ref 8.9–10.3)
Chloride: 102 mmol/L (ref 98–111)
Creatinine, Ser: 1.01 mg/dL — ABNORMAL HIGH (ref 0.44–1.00)
GFR, Estimated: 55 mL/min — ABNORMAL LOW (ref >60)
Glucose, Bld: 101 mg/dL — ABNORMAL HIGH (ref 70–99)
Potassium: 3.5 mmol/L (ref 3.5–5.1)
Sodium: 139 mmol/L (ref 135–145)

## 2023-07-23 LAB — APTT: aPTT: 32 s (ref 24–36)

## 2023-07-23 LAB — TROPONIN I (HIGH SENSITIVITY)
Troponin I (High Sensitivity): 24 ng/L — ABNORMAL HIGH (ref <18)
Troponin I (High Sensitivity): 20 ng/L — ABNORMAL HIGH (ref <18)

## 2023-07-23 LAB — LACTIC ACID, PLASMA
Lactic Acid, Venous: 1.5 mmol/L (ref 0.5–1.9)
Lactic Acid, Venous: 1 mmol/L (ref 0.5–1.9)

## 2023-07-23 LAB — HEPARIN LEVEL (UNFRACTIONATED): Heparin Unfractionated: 0.86 [IU]/mL — ABNORMAL HIGH (ref 0.30–0.70)

## 2023-07-23 MED ORDER — IOHEXOL 350 MG/ML SOLN
75.0000 mL | Freq: Once | INTRAVENOUS | Status: AC | PRN
  Administered 2023-07-23: 75 mL via INTRAVENOUS

## 2023-07-23 MED ORDER — HEPARIN SODIUM (PORCINE) 5000 UNIT/ML IJ SOLN
4000.0000 [IU] | Freq: Once | INTRAMUSCULAR | Status: AC
  Administered 2023-07-23: 4000 [IU] via INTRAVENOUS
  Filled 2023-07-23: qty 1

## 2023-07-23 MED ORDER — TRAZODONE HCL 50 MG PO TABS: 100.0000 mg | ORAL_TABLET | Freq: Every evening | ORAL | Status: DC | PRN

## 2023-07-23 MED ORDER — PANTOPRAZOLE SODIUM 40 MG PO TBEC
40.0000 mg | DELAYED_RELEASE_TABLET | Freq: Every day | ORAL | Status: DC
  Administered 2023-07-23 – 2023-07-24 (×2): 40 mg via ORAL
  Filled 2023-07-23 (×2): qty 1

## 2023-07-23 MED ORDER — ROPINIROLE HCL 1 MG PO TABS
1.0000 mg | ORAL_TABLET | Freq: Two times a day (BID) | ORAL | Status: DC
  Administered 2023-07-23 – 2023-07-24 (×2): 1 mg via ORAL
  Filled 2023-07-23 (×2): qty 1

## 2023-07-23 MED ORDER — AMLODIPINE BESYLATE 5 MG PO TABS
5.0000 mg | ORAL_TABLET | Freq: Every day | ORAL | Status: DC
  Administered 2023-07-23 – 2023-07-24 (×2): 5 mg via ORAL
  Filled 2023-07-23 (×2): qty 1

## 2023-07-23 MED ORDER — GABAPENTIN 300 MG PO CAPS
300.0000 mg | ORAL_CAPSULE | Freq: Every day | ORAL | Status: DC
  Administered 2023-07-23: 300 mg via ORAL
  Filled 2023-07-23: qty 1

## 2023-07-23 MED ORDER — ONDANSETRON HCL 4 MG PO TABS: 4.0000 mg | ORAL_TABLET | Freq: Three times a day (TID) | ORAL | Status: DC | PRN

## 2023-07-23 MED ORDER — SPIRONOLACTONE 25 MG PO TABS
25.0000 mg | ORAL_TABLET | Freq: Every day | ORAL | Status: DC
  Administered 2023-07-23 – 2023-07-24 (×2): 25 mg via ORAL
  Filled 2023-07-23 (×2): qty 1

## 2023-07-23 MED ORDER — HEPARIN (PORCINE) 25000 UT/250ML-% IV SOLN
950.0000 [IU]/h | INTRAVENOUS | Status: DC
  Administered 2023-07-23: 1100 [IU]/h via INTRAVENOUS
  Filled 2023-07-23: qty 250

## 2023-07-23 MED ORDER — HEPARIN SODIUM (PORCINE) 5000 UNIT/ML IJ SOLN: 60.0000 [IU]/kg | Freq: Once | INTRAMUSCULAR | Status: DC

## 2023-07-23 MED ORDER — ATORVASTATIN CALCIUM 20 MG PO TABS
40.0000 mg | ORAL_TABLET | Freq: Every day | ORAL | Status: DC
  Administered 2023-07-23: 40 mg via ORAL
  Filled 2023-07-23: qty 2

## 2023-07-23 MED ORDER — ASPIRIN 81 MG PO TBEC
81.0000 mg | DELAYED_RELEASE_TABLET | Freq: Every day | ORAL | Status: DC
  Administered 2023-07-23 – 2023-07-24 (×2): 81 mg via ORAL
  Filled 2023-07-23 (×2): qty 1

## 2023-07-23 NOTE — ED Provider Notes (Signed)
[facility] Provider Note    Event Date/Time   First MD Initiated Contact with Patient 07/23/23 1010     (approximate)   History   Chest Pain   HPI  Patricia Knox is a 88 y.o. female who presents to the ED for evaluation of Chest Pain   Review PCP visit from November.  History of GERD, HTN, HLD and PE.  Patient presents with her daughter for evaluation of about 4 days of right sided chest pain beneath her right breast.  Reports some dyspnea on exertion but no cough.  No fevers.  No abdominal pain, emesis or urinary changes such as dysuria, frequency, no incontinence or urgency.  Her daughter shows me paperwork from an urgent care visit from this past Monday.  She was seen for the same right flank pain and diagnosed with pyelonephritis and discharged with a prescription for nitrofurantoin.  UA dipstick with large leukocytes but negative leuk esterases, negative nitrites.  Patient presents to the ED due to persistent symptoms   Physical Exam   Triage Vital Signs: ED Triage Vitals  Encounter Vitals Group     BP 07/23/23 0912 (!) 141/89     Systolic BP Percentile --      Diastolic BP Percentile --      Pulse Rate 07/23/23 0912 90     Resp 07/23/23 0912 16     Temp 07/23/23 0912 98.3 F (36.8 C)     Temp Source 07/23/23 0912 Oral     SpO2 07/23/23 0912 91 %     Weight 07/23/23 0913 145 lb (65.8 kg)     Height 07/23/23 0913 5\' 2"  (1.575 m)     Head Circumference --      Peak Flow --      Pain Score 07/23/23 0920 8     Pain Loc --      Pain Education --      Exclude from Growth Chart --     Most recent vital signs: Vitals:   07/23/23 0912 07/23/23 0919  BP: (!) 141/89   Pulse: 90   Resp: 16   Temp: 98.3 F (36.8 C)   SpO2: 91% 94%    General: Awake, no distress.  CV:  Good peripheral perfusion.  Resp:  Normal effort.  Abd:  No distention.  No abdominal tenderness including RUQ, suprapubic.  Benign exam MSK:  No deformity noted.  No  CVA tenderness. Neuro:  No focal deficits appreciated. Other:     ED Results / Procedures / Treatments   Labs (all labs ordered are listed, but only abnormal results are displayed) Labs Reviewed  BASIC METABOLIC PANEL - Abnormal; Notable for the following components:      Result Value   Glucose, Bld 101 (*)    Creatinine, Ser 1.01 (*)    GFR, Estimated 55 (*)    All other components within normal limits  CBC - Abnormal; Notable for the following components:   WBC 10.7 (*)    All other components within normal limits  HEPATIC FUNCTION PANEL - Abnormal; Notable for the following components:   Albumin 3.2 (*)    All other components within normal limits  URINALYSIS, ROUTINE W REFLEX MICROSCOPIC - Abnormal; Notable for the following components:   Color, Urine YELLOW (*)    APPearance HAZY (*)    Ketones, ur 5 (*)    Leukocytes,Ua LARGE (*)    Bacteria, UA RARE (*)    All other components  within normal limits  TROPONIN I (HIGH SENSITIVITY) - Abnormal; Notable for the following components:   Troponin I (High Sensitivity) 24 (*)    All other components within normal limits  CULTURE, BLOOD (SINGLE)  URINE CULTURE  LACTIC ACID, PLASMA  PROCALCITONIN  LACTIC ACID, PLASMA  PROTIME-INR  APTT  TROPONIN I (HIGH SENSITIVITY)    EKG Sinus rhythm with a rate of 78 bpm.  Normal axis and intervals.  No STEMI.  RADIOLOGY 2 view CXR interpreted by me with a right basilar infiltrate and associated pleural effusion. CTA chest interpreted by me with bilateral PE, small effusion on the right.  Official radiology report(s): CT Angio Chest PE W and/or Wo Contrast Addendum Date: 07/23/2023 ADDENDUM REPORT: 07/23/2023 11:53 ADDENDUM: Study discussed by telephone with Dr. Delton Prairie on 07/23/2023 at 1139 hours. Electronically Signed   By: Odessa Fleming M.D.   On: 07/23/2023 11:53   Result Date: 07/23/2023 CLINICAL DATA:  88 year old female with chest pain. EXAM: CT ANGIOGRAPHY CHEST WITH CONTRAST  TECHNIQUE: Multidetector CT imaging of the chest was performed using the standard protocol during bolus administration of intravenous contrast. Multiplanar CT image reconstructions and MIPs were obtained to evaluate the vascular anatomy. RADIATION DOSE REDUCTION: This exam was performed according to the departmental dose-optimization program which includes automated exposure control, adjustment of the mA and/or kV according to patient size and/or use of iterative reconstruction technique. CONTRAST:  75mL OMNIPAQUE IOHEXOL 350 MG/ML SOLN COMPARISON:  Chest radiographs 0941 hours today. FINDINGS: Cardiovascular: Good contrast bolus timing in the pulmonary arterial tree. Bilateral pulmonary emboli. No saddle embolus. No lobar thrombosis. But widespread segmental pulmonary clot in the upper lobes, right middle lobe. Superimposed tortuous aorta with advanced calcified atherosclerosis. Calcified coronary artery atherosclerosis. No pericardial effusion. Mediastinum/Nodes: Large gastric hiatal hernia with intrathoracic stomach and mesentery. No superimposed mediastinal mass or lymphadenopathy. Lungs/Pleura: Major airways are patent. Subtotal right lower lobe atelectasis is due to combined compression from intrathoracic stomach, moderate layering right pleural effusion with simple fluid density. There is confluent and peripheral ground-glass opacity in the right upper lobe suspicious for developing pulmonary infarct. And there is mosaic attenuation elsewhere in both upper lobes. Right middle lobe lateral segment enhancing atelectasis. No left pleural effusion. Negative left lower lobe. Upper Abdomen: Fluid density circumscribed area in the anterior liver on series 4, image 131 is favored to be benign cyst (no follow-up imaging recommended). Negative visible spleen, pancreas, adrenal glands and kidneys. Intrathoracic stomach, negative visible other bowel in the upper abdomen. No free air or free fluid. Musculoskeletal:  Advanced thoracic spine degeneration superimposed on some levels of interbody ankylosis. Pronounced vacuum disc at T11-T12. No acute or suspicious osseous lesion is identified. Review of the MIP images confirms the above findings. IMPRESSION: 1. Positive for bilateral segmental pulmonary emboli. No saddle embolus or lobar thrombosis. Evidence of right upper lobe developing pulmonary infarct. Small to moderate layering right pleural effusion appears to be transudate. 2. Superimpose intrathoracic mass effect from very Large hiatal hernia with intrathoracic stomach and mesentery. Right lower and middle lobe atelectasis. 3. Aortic Atherosclerosis (ICD10-I70.0). Coronary artery atherosclerosis. Electronically Signed: By: Odessa Fleming M.D. On: 07/23/2023 11:35   DG Chest 2 View Result Date: 07/23/2023 CLINICAL DATA:  Chest pain. EXAM: CHEST - 2 VIEW COMPARISON:  None Available. FINDINGS: Mild cardiomegaly is noted. Left lung is clear. Small right pleural effusion is noted with associated right basilar atelectasis or infiltrate. Bony thorax is unremarkable. IMPRESSION: Small right pleural effusion with adjacent right basilar  atelectasis or infiltrate. Electronically Signed   By: Lupita Raider M.D.   On: 07/23/2023 10:20    PROCEDURES and INTERVENTIONS:  .1-3 Lead EKG Interpretation  Performed by: Delton Prairie, MD Authorized by: Delton Prairie, MD     Interpretation: normal     ECG rate:  90   ECG rate assessment: normal     Rhythm: sinus rhythm     Ectopy: none     Conduction: normal   .Critical Care  Performed by: Delton Prairie, MD Authorized by: Delton Prairie, MD   Critical care provider statement:    Critical care time (minutes):  30   Critical care time was exclusive of:  Separately billable procedures and treating other patients   Critical care was necessary to treat or prevent imminent or life-threatening deterioration of the following conditions:  Cardiac failure and circulatory failure   Critical  care was time spent personally by me on the following activities:  Development of treatment plan with patient or surrogate, discussions with consultants, evaluation of patient's response to treatment, examination of patient, ordering and review of laboratory studies, ordering and review of radiographic studies, ordering and performing treatments and interventions, pulse oximetry, re-evaluation of patient's condition and review of old charts   Medications  heparin injection 4,000 Units (has no administration in time range)  heparin ADULT infusion 100 units/mL (25000 units/277mL) (has no administration in time range)  iohexol (OMNIPAQUE) 350 MG/ML injection 75 mL (75 mLs Intravenous Contrast Given 07/23/23 1106)     IMPRESSION / MDM / ASSESSMENT AND PLAN / ED COURSE  I reviewed the triage vital signs and the nursing notes.  Differential diagnosis includes, but is not limited to, PE, pneumonia, sepsis, parapneumonic effusion, ACS, biliary colic  {Patient presents with symptoms of an acute illness or injury that is potentially life-threatening.  Pleasant 88 year old woman presents with a few days of chest discomfort, dyspnea on exertion found to have a PE requiring medical admission.  O2 saturations are borderline on room air.  Otherwise stable.  Troponin minimally elevated and we will trend this.  Only chest pain is pleuritic.  Doubt ACS.  Nonischemic EKG.  Normal CBC, metabolic panel.  UA does have large leukocytes but she has no urinary symptoms so we will abstain from antibiotics and send for culture.  Negative procalcitonin.  Start the patient on heparin and consult medicine for admission.  Clinical Course as of 07/23/23 1159  Thu Jul 23, 2023  1139 Call from rads. PE [DS]  1145 Updated patient of this and recommended medical admission.  She is agreeable. [DS]    Clinical Course User Index [DS] Delton Prairie, MD     FINAL CLINICAL IMPRESSION(S) / ED DIAGNOSES   Final diagnoses:   Multiple subsegmental pulmonary emboli without acute cor pulmonale (HCC)     Rx / DC Orders   ED Discharge Orders     None        Note:  This document was prepared using Dragon voice recognition software and may include unintentional dictation errors.   Delton Prairie, MD 07/23/23 [phone]

## 2023-07-23 NOTE — Progress Notes (Signed)
Per Dr Chipper Herb, dc order for cont pulse ox.  Get pulse ox with vital signs.

## 2023-07-23 NOTE — Consult Note (Signed)
PHARMACY - ANTICOAGULATION CONSULT NOTE  Pharmacy Consult for Heparin Indication: pulmonary embolus  No Known Allergies  Patient Measurements: Height: 5\' 2"  (157.5 cm) Weight: 65.8 kg (145 lb) IBW/kg (Calculated) : 50.1 Heparin Dosing Weight: 63.6 kg  Vital Signs: Temp: 98 F (36.7 C) (02/20 1531) Temp Source: Oral (02/20 1455) BP: 141/78 (02/20 1531) Pulse Rate: 85 (02/20 1531)  Labs: Recent Labs    07/23/23 0936 07/23/23 1129 07/23/23 1222 07/23/23 2016  HGB 12.3  --   --   --   HCT 37.1  --   --   --   PLT 278  --   --   --   APTT  --   --  32  --   LABPROT  --   --  13.5  --   INR  --   --  1.0  --   HEPARINUNFRC  --   --   --  0.86*  CREATININE 1.01*  --   --   --   TROPONINIHS 24* 20*  --   --     Estimated Creatinine Clearance: 36.3 mL/min (A) (by C-G formula based on SCr of 1.01 mg/dL (H)).   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 female with medical history significant of remote history of PE, HTN, HLD, breast cancer status post lumpectomy, restless leg syndrome, presented with worsening chest pain. CT positive for bilateral segmental acute pulmonary embolism. Pharmacy has been consulted to initiate and monitor heparin infusion.  2/20 2016 HL 0.86    Goal of Therapy:  Heparin level 0.3-0.7 units/ml Monitor platelets by anticoagulation protocol: Yes   Plan:  Heparin level is supratherapeutic. Will decrease heparin infusion to 950 units/hr. Recheck heparin level in 8 hours. CBC daily while on heparin.   Ronnald Ramp, PharmD, BCPS 07/23/2023,9:20 PM

## 2023-07-23 NOTE — Progress Notes (Signed)
*  PRELIMINARY RESULTS* Echocardiogram 2D Echocardiogram has been performed.  Carolyne Fiscal 07/23/2023, 2:20 PM

## 2023-07-23 NOTE — H&P (Signed)
History and Physical    Patricia Knox GMW:[id number] DOB: Jul 22, 1935 DOA: 07/23/2023  PCP: Margaretann Loveless, MD (Confirm with patient/family/NH records and if not entered, this has to be entered at [facility] point of entry) Patient coming from: Home  I have personally briefly reviewed patient's old medical records in [facility]  Chief Complaint: Chest pain  HPI: Patricia Knox is a 88 y.o. female with medical history significant of remote history of PE, HTN, HLD, breast cancer status post lumpectomy, restless leg syndrome, presented with worsening chest pain.  Symptoms started 4 days ago, patient started to have right-sided chest pain which she described as soreness, worsening with dry cough and deep breath.  Lately she also developed swelling of her right leg, but denied any leg pain, no recent long distance travel.  She remembers she was diagnosed with PE many years ago and was on some medications but she could not tell me what details.  She went to see urgent care earlier this week and was diagnosed with " kidney infection" and started on Macrobid.  Patient denies any dysuria urinary frequency or back pain.  No fever or chills.   ED Course: Afebrile, nontachycardic blood pressure 140/89, O2 saturation 91% on room air with short distance.  CTA showed possible bilateral segmental pulmonary emboli and evidence of right upper lobe developing pulmonary infarct.  Patient was started on heparin drip in the ED.  Review of Systems: As per HPI otherwise 14 point review of systems negative.    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   COLONOSCOPY  2002   TOE SURGERY  1999     reports that she has never smoked. She has never used smokeless tobacco. She reports that she does not drink alcohol and does not use drugs.  No Known Allergies  Family History  Problem Relation Age of Onset   Stroke Mother    Heart attack Father    Breast cancer Neg Hx      Prior to Admission medications   Medication Sig Start Date End Date Taking? Authorizing Provider  abobotulinumtoxinA (DYSPORT) 300 units SOLR injection Inject 300 Units into the muscle every 3 (three) months. 12/04/21   Levert Feinstein, MD  alendronate (FOSAMAX) 70 MG tablet TAKE 1 TABLET AS SINGLE DOSE WEEKLY 05/21/23   Margaretann Loveless, MD  amLODipine (NORVASC) 5 MG tablet Take 1 tablet by mouth once daily 02/24/23   Margaretann Loveless, MD  aspirin 325 MG tablet Take 325 mg by mouth  daily.    [provider]  atorvastatin (LIPITOR) 40 MG tablet TAKE 1 TABLET BY MOUTH ONCE DAILY AT BEDTIME 06/29/23   Margaretann Loveless, MD  Calcium Carbonate-Vitamin D (CALTRATE 600+D PO) Take 1 capsule by mouth 2 (two) times daily.    [provider]  gabapentin (NEURONTIN) 100 MG capsule TAKE 3 CAPSULES BY MOUTH AT BEDTIME 07/06/23   Levert Feinstein, MD  omeprazole (PRILOSEC) 20 MG capsule Take 1 capsule by mouth twice daily 07/02/23   Margaretann Loveless, MD  ondansetron (ZOFRAN) 4 MG tablet Take 1 tablet (4 mg total) by mouth every 8 (eight) hours as needed. 10/31/20   Hyatt, Max T, DPM   rOPINIRole (REQUIP) 1 MG tablet Take 1 tablet by mouth twice daily 06/01/23   Margaretann Loveless, MD  simvastatin (ZOCOR) 80 MG tablet Take 80 mg by mouth daily at 6 PM.  01/17/13   [provider]  spironolactone (ALDACTONE) 25 MG tablet Take 1 tablet by mouth once daily 02/15/23 02/15/24  Margaretann Loveless, MD  traZODone (DESYREL) 100 MG tablet TAKE 1 TABLET BY MOUTH NIGHTLY AT BEDTIME AS NEEDED FOR INSOMNIA 05/11/23   Margaretann Loveless, MD    Physical Exam: Vitals:   07/23/23 0912 07/23/23 0913 07/23/23 0919 07/23/23 0920  BP: (!) 141/89     Pulse: 90     Resp: 16     Temp: 98.3 F (36.8 C)     TempSrc: Oral     SpO2: 91%  94%   Weight:  65.8 kg  65.8 kg  Height:  5\' 2"  (1.575 m)  5\' 2"  (1.575 m)    Constitutional: NAD, calm, comfortable Vitals:   07/23/23 0912 07/23/23 0913 07/23/23 0919 07/23/23 0920  BP: (!) 141/89     Pulse: 90     Resp: 16     Temp: 98.3 F (36.8 C)     TempSrc: Oral     SpO2: 91%  94%   Weight:  65.8 kg  65.8 kg  Height:  5\' 2"  (1.575 m)  5\' 2"  (1.575 m)   Eyes: PERRL, lids and conjunctivae normal ENMT: Mucous membranes are moist. Posterior pharynx clear of any exudate or lesions.Normal dentition.  Neck: normal, supple, no masses, no thyromegaly Respiratory: clear to auscultation bilaterally, no wheezing, no crackles. Normal respiratory effort. No accessory muscle use.  Cardiovascular: Regular rate and rhythm, no murmurs / rubs / gallops. 2+ extremity edema right more than left. 2+ pedal pulses. No carotid bruits.  Abdomen: no tenderness, no masses palpated. No hepatosplenomegaly. Bowel sounds positive.  Musculoskeletal: no clubbing / cyanosis. No joint deformity upper and lower extremities. Good ROM, no contractures. Normal muscle tone.  Skin: no rashes, lesions, ulcers. No induration Neurologic: CN 2-12 grossly intact. Sensation intact, DTR normal. Strength 5/5 in all 4.  Psychiatric: Normal judgment and insight. Alert and oriented x 3. Normal mood.      Labs on Admission: I have personally reviewed following labs and imaging studies  CBC: Recent Labs  Lab 07/23/23 0936  WBC 10.7*  HGB 12.3  HCT 37.1  MCV 89.0  PLT 278   Basic Metabolic Panel: Recent Labs  Lab 07/23/23 0936  NA 139  K 3.5  CL 102  CO2 24  GLUCOSE 101*  BUN 12  CREATININE 1.01*  CALCIUM 9.0   GFR: Estimated Creatinine Clearance: 36.3 mL/min (A) (by C-G formula based on SCr of 1.01 mg/dL (H)). Liver Function Tests: Recent Labs  Lab 07/23/23 [phone]  AST 27  ALT 19  ALKPHOS 79  BILITOT 1.1  PROT 7.3  ALBUMIN 3.2*   No results for input(s): "LIPASE", "AMYLASE" in the last 168 hours. No results for input(s): "AMMONIA" in the last 168 hours. Coagulation Profile: No results for input(s): "INR", "PROTIME" in the last 168 hours. Cardiac Enzymes: No results for input(s): "CKTOTAL", "CKMB", "CKMBINDEX", "TROPONINI" in the last 168 hours. BNP (last 3 results) No results for input(s): "PROBNP" in the last 8760 hours. HbA1C: No results for input(s): "HGBA1C" in the last 72 hours. CBG: No results for input(s): "GLUCAP" in the last 168 hours. Lipid Profile: No results for input(s): "CHOL", "HDL", "LDLCALC", "TRIG", "CHOLHDL", "LDLDIRECT" in the last 72 hours. Thyroid Function Tests: No results for input(s): "TSH", "T4TOTAL", "FREET4", "T3FREE", "THYROIDAB" in the last 72 hours. Anemia Panel: No results for input(s): "VITAMINB12", "FOLATE", "FERRITIN", "TIBC", "IRON", "RETICCTPCT" in the last 72 hours. Urine analysis:    Component Value Date/Time   COLORURINE YELLOW (A) 07/23/2023 1025   APPEARANCEUR HAZY (A) 07/23/2023 1025   LABSPEC 1.011 07/23/2023 1025   PHURINE 7.0 07/23/2023 1025   GLUCOSEU NEGATIVE 07/23/2023 1025   HGBUR NEGATIVE 07/23/2023 1025   BILIRUBINUR NEGATIVE 07/23/2023 1025   KETONESUR 5 (A) 07/23/2023 1025   PROTEINUR NEGATIVE 07/23/2023 1025   NITRITE NEGATIVE 07/23/2023 1025   LEUKOCYTESUR LARGE (A) 07/23/2023 1025     Radiological Exams on Admission: CT Angio Chest PE W and/or Wo Contrast Addendum Date: 07/23/2023 ADDENDUM REPORT: 07/23/2023 11:53 ADDENDUM: Study discussed by telephone with Dr. Delton Prairie on 07/23/2023 at 1139 hours. Electronically Signed   By: Odessa Fleming M.D.   On: 07/23/2023 11:53   Result Date: 07/23/2023 CLINICAL DATA:  88 year old female with chest pain. EXAM: CT ANGIOGRAPHY CHEST WITH CONTRAST TECHNIQUE: Multidetector CT imaging of the chest was performed using the standard protocol during bolus administration of intravenous contrast. Multiplanar CT image reconstructions and MIPs were obtained to evaluate the vascular anatomy. RADIATION DOSE REDUCTION: This exam was performed according to the departmental dose-optimization program which includes automated exposure control, adjustment of the mA and/or kV according to patient size and/or use of iterative reconstruction technique. CONTRAST:  75mL OMNIPAQUE IOHEXOL 350 MG/ML SOLN COMPARISON:  Chest radiographs 0941 hours today. FINDINGS: Cardiovascular: Good contrast bolus timing in the pulmonary arterial tree. Bilateral pulmonary emboli. No saddle embolus. No lobar thrombosis. But widespread segmental pulmonary clot in the upper lobes, right middle lobe. Superimposed tortuous aorta with advanced calcified atherosclerosis. Calcified coronary artery atherosclerosis. No pericardial effusion. Mediastinum/Nodes: Large gastric hiatal hernia with intrathoracic stomach and mesentery. No superimposed mediastinal mass or lymphadenopathy. Lungs/Pleura: Major airways are patent. Subtotal right lower lobe atelectasis is due to combined compression from intrathoracic stomach, moderate layering right pleural effusion with simple fluid density. There is confluent and peripheral ground-glass opacity in the right upper lobe suspicious for developing pulmonary infarct. And there is mosaic attenuation elsewhere in both upper lobes. Right middle lobe lateral segment  enhancing atelectasis. No left pleural effusion. Negative left lower lobe. Upper Abdomen: Fluid density circumscribed area in the anterior liver on series 4, image 131 is favored to be benign cyst (no follow-up imaging recommended). Negative visible spleen, pancreas, adrenal glands and kidneys. Intrathoracic stomach, negative visible other bowel in the upper abdomen. No free air or free fluid. Musculoskeletal: Advanced thoracic spine degeneration superimposed on some levels of interbody ankylosis. Pronounced vacuum disc at T11-T12. No acute or suspicious osseous lesion is identified. Review of the MIP images confirms the above findings. IMPRESSION:  1. Positive for bilateral segmental pulmonary emboli. No saddle embolus or lobar thrombosis. Evidence of right upper lobe developing pulmonary infarct. Small to moderate layering right pleural effusion appears to be transudate. 2. Superimpose intrathoracic mass effect from very Large hiatal hernia with intrathoracic stomach and mesentery. Right lower and middle lobe atelectasis. 3. Aortic Atherosclerosis (ICD10-I70.0). Coronary artery atherosclerosis. Electronically Signed: By: Odessa Fleming M.D. On: 07/23/2023 11:35   DG Chest 2 View Result Date: 07/23/2023 CLINICAL DATA:  Chest pain. EXAM: CHEST - 2 VIEW COMPARISON:  None Available. FINDINGS: Mild cardiomegaly is noted. Left lung is clear. Small right pleural effusion is noted with associated right basilar atelectasis or infiltrate. Bony thorax is unremarkable. IMPRESSION: Small right pleural effusion with adjacent right basilar atelectasis or infiltrate. Electronically Signed   By: Lupita Raider M.D.   On: 07/23/2023 10:20    EKG: Independently reviewed.  Sinus rhythm, no acute ST changes.  Assessment/Plan Principal Problem:   PE (pulmonary thromboembolism) (HCC) Active Problems:   Acute pulmonary embolism (HCC)  (please populate well all problems here in Problem List. (For example, if patient is on BP meds at  home and you resume or decide to hold them, it is a problem that needs to be her. Same for CAD, COPD, HLD and so on)  Acute pulmonary embolism, bilateral segmental Acute right upper lobe lung infarction -Unprovoked, given there is a history of previous PE, lifelong systemic anticoagulation indicated. -Continue heparin today, expect switch to Eliquis on discharge. -Check ambulatory pulse ox, may need short course of home oxygen -DVT study -Echocardiogram  HTN -Stable, continue amlodipine and spironolactone  HLD -Continue statin  DVT prophylaxis: Heparin drip Code Status: Full code Family Communication: Daughter at bedside Disposition Plan: Expect less than 2 midnight hospital stay Consults called: None Admission status: Telemetry observation   Emeline General MD [facility] Pager [phone]  07/23/2023, 12:24 PM

## 2023-07-23 NOTE — Consult Note (Signed)
PHARMACY - ANTICOAGULATION CONSULT NOTE  Pharmacy Consult for Heparin Indication: pulmonary embolus  No Known Allergies  Patient Measurements: Height: 5\' 2"  (157.5 cm) Weight: 65.8 kg (145 lb) IBW/kg (Calculated) : 50.1 Heparin Dosing Weight: 63.6 kg  Vital Signs: Temp: 97.9 F (36.6 C) (02/20 1455) Temp Source: Oral (02/20 1455) BP: 141/89 (02/20 0912) Pulse Rate: 90 (02/20 0912)  Labs: Recent Labs    07/23/23 0936 07/23/23 1129 07/23/23 1222  HGB 12.3  --   --   HCT 37.1  --   --   PLT 278  --   --   APTT  --   --  32  LABPROT  --   --  13.5  INR  --   --  1.0  CREATININE 1.01*  --   --   TROPONINIHS 24* 20*  --     Estimated Creatinine Clearance: 36.3 mL/min (A) (by C-G formula based on SCr of 1.01 mg/dL (H)).   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 female with medical history significant of remote history of PE, HTN, HLD, breast cancer status post lumpectomy, restless leg syndrome, presented with worsening chest pain. CT positive for bilateral segmental acute pulmonary embolism. Pharmacy has been consulted to initiate and monitor heparin infusion.  Baseline labs: aPTT 32sec, INR 1.0, Plts 278, hgb 12.3  Goal of Therapy:  Heparin level 0.3-0.7 units/ml Monitor platelets by anticoagulation protocol: Yes   Plan:  Give 4000 units bolus x 1 Start heparin infusion at 1100 units/hr Check anti-Xa level in 8 hours and daily while on heparin Continue to monitor H&H and platelets  Shruti Arrey A Dylyn Mclaren 07/23/2023,3:17 PM

## 2023-07-23 NOTE — ED Triage Notes (Signed)
Patient reports right sided pain under her breast. Patient states she was seen at the urgent care and they diagnosed her with a kidney infection. Gave her nitrofurantoin. Patient states the pain is not towards her back its under her ribs and breast.  Pain does not radiate and is constant. Rates pain 9/10. States it's not sharp, it just hurts. No blood thinners.

## 2023-07-24 ENCOUNTER — Other Ambulatory Visit: Payer: Self-pay

## 2023-07-24 DIAGNOSIS — I82412 Acute embolism and thrombosis of left femoral vein: Secondary | ICD-10-CM | POA: Diagnosis not present

## 2023-07-24 DIAGNOSIS — E785 Hyperlipidemia, unspecified: Secondary | ICD-10-CM

## 2023-07-24 DIAGNOSIS — I10 Essential (primary) hypertension: Secondary | ICD-10-CM

## 2023-07-24 DIAGNOSIS — I2699 Other pulmonary embolism without acute cor pulmonale: Secondary | ICD-10-CM | POA: Diagnosis not present

## 2023-07-24 LAB — URINE CULTURE

## 2023-07-24 LAB — CBC
HCT: 31.8 % — ABNORMAL LOW (ref 36.0–46.0)
Hemoglobin: 10.9 g/dL — ABNORMAL LOW (ref 12.0–15.0)
MCH: 29.6 pg (ref 26.0–34.0)
MCHC: 34.3 g/dL (ref 30.0–36.0)
MCV: 86.4 fL (ref 80.0–100.0)
Platelets: 302 10*3/uL (ref 150–400)
RBC: 3.68 MIL/uL — ABNORMAL LOW (ref 3.87–5.11)
RDW: 13.2 % (ref 11.5–15.5)
WBC: 9.4 10*3/uL (ref 4.0–10.5)
nRBC: 0 % (ref 0.0–0.2)

## 2023-07-24 LAB — HEPARIN LEVEL (UNFRACTIONATED): Heparin Unfractionated: 0.68 [IU]/mL (ref 0.30–0.70)

## 2023-07-24 MED ORDER — APIXABAN 5 MG PO TABS: 5.0000 mg | ORAL_TABLET | Freq: Two times a day (BID) | ORAL | Status: DC

## 2023-07-24 MED ORDER — APIXABAN 5 MG PO TABS
ORAL_TABLET | ORAL | 3 refills | Status: DC
  Filled 2023-07-24: qty 60, 23d supply, fill #0
  Filled 2023-08-17: qty 60, 14d supply, fill #1
  Filled 2023-08-18: qty 60, 30d supply, fill #1

## 2023-07-24 MED ORDER — ASPIRIN 81 MG PO TBEC
81.0000 mg | DELAYED_RELEASE_TABLET | Freq: Every day | ORAL | 12 refills | Status: DC
  Filled 2023-07-24: qty 30, 30d supply, fill #0
  Filled 2023-08-17: qty 30, 30d supply, fill #1

## 2023-07-24 MED ORDER — APIXABAN 5 MG PO TABS
10.0000 mg | ORAL_TABLET | Freq: Two times a day (BID) | ORAL | Status: DC
  Administered 2023-07-24: 10 mg via ORAL
  Filled 2023-07-24: qty 2

## 2023-07-24 NOTE — Progress Notes (Signed)
Pt noted with O2 sats maintaining btwn 92%-93% on room air after ambulation. Pt noted with audible wheezing after ambulating, denies shortness of breath.

## 2023-07-24 NOTE — Discharge Summary (Signed)
Physician Discharge Summary   Patient: Patricia Knox MRN: [id number] DOB: 1935/06/24  Admit date:     07/23/2023  Discharge date: 07/24/23  Discharge Physician: Enedina Finner   PCP: Margaretann Loveless, MD   Recommendations at discharge:   follow-up PCP in 1 to 2 weeks follow-up vascular surgery Dr. Wyn Quaker in 2 to 3 weeks for PE DVT  Discharge Diagnoses: Principal Problem:   PE (pulmonary thromboembolism) (HCC) Active Problems:   Acute pulmonary embolism (HCC)  Patricia Knox is a 88 y.o. female with medical history significant of remote history of PE, HTN, HLD, breast cancer status post lumpectomy, restless leg syndrome, presented with worsening chest pain.   Symptoms started 4 days ago, patient started to have right-sided chest pain which she described as soreness, worsening with dry cough and deep breath.  Lately she also developed swelling of her right leg, but denied any leg pain, no recent long distance travel. She remembers she was diagnosed with PE many years ago   CT chest: positive for bilateral segmental pulmonary emboli. No saddle embolus or lobar thrombosis. Evidence of right upper lobe developing pulmonary infarct. Small to moderate layering right pleural effusion appears to be transudate. 2. Superimpose intrathoracic mass effect from very Large hiatal hernia with intrathoracic stomach and mesentery. Right lower and middle lobe atelectasis.  Ultrasound venous Doppler lower extremity  Nonocclusive thrombus in the left common femoral vein near the saphenofemoral junction. Thrombus extends into the thigh segment of the left great saphenous vein. 2. Nonocclusive thrombus in the left popliteal vein. 3. No evidence of right lower extremity deep venous thrombosis. 4. Left popliteal fossa Baker's cyst measuring approximately 5.2 x 1.3 x 2.7 cm.  Acute PE bilateral segmental, recurrent acute left lower extremity DVT -- patient placed on IV heparin drip, overall feels a lot  better. No respiratory distress. Vitals stable. -- Vascular consultation with Dr. Wyn Quaker. No vascular procedure recommended. Okay to switch to oral eliquis and follow-up as outpatient -- discussed with patient and daughter risk and benefits of eliquis. The voice understanding. -- Oxygen sats stable on room air  History of breast cancer status post lumpectomy in 2017 -- patient does not follow up with oncology per her report  Hypertension -- resumed home meds  Hyperlipidemia  --continue statins  Overall hemodynamically stable. Patient is eager to go home. Discharge plan discussed with patient and family. Will discharge to home with outpatient PCP and vascular surgery follow-up      Consultants: vascular surgery Procedures performed: none Disposition: Home Diet recommendation:  Cardiac diet DISCHARGE MEDICATION: Allergies as of 07/24/2023   No Known Allergies      Medication List     STOP taking these medications    aspirin 325 MG tablet Replaced by: aspirin EC 81 MG tablet       TAKE these medications    alendronate 70 MG tablet Commonly known as: FOSAMAX TAKE 1 TABLET AS SINGLE DOSE WEEKLY   amLODipine 5 MG tablet Commonly known as: NORVASC Take 1 tablet by mouth once daily   aspirin EC 81 MG tablet Take 1 tablet (81 mg total) by mouth daily. Swallow whole. Start taking on: July 25, 2023 Replaces: aspirin 325 MG tablet   atorvastatin 40 MG tablet Commonly known as: LIPITOR TAKE 1 TABLET BY MOUTH ONCE DAILY AT BEDTIME   CALTRATE 600+D PO Take 1 capsule by mouth 2 (two) times daily.   Dysport 300 units Solr injection Generic drug: abobotulinumtoxinA Inject 300 Units into the  muscle every 3 (three) months.   Eliquis 5 MG Tabs tablet Generic drug: apixaban Take 2 tablets (10 mg total) by mouth 2 (two) times daily for 7 days, THEN 1 tablet (5 mg total) 2 (two) times daily. Start taking on: July 24, 2023   gabapentin 100 MG capsule Commonly known  as: NEURONTIN TAKE 3 CAPSULES BY MOUTH AT BEDTIME   nitrofurantoin (macrocrystal-monohydrate) 100 MG capsule Commonly known as: MACROBID Take 100 mg by mouth 2 (two) times daily.   omeprazole 20 MG capsule Commonly known as: PRILOSEC Take 1 capsule by mouth twice daily   rOPINIRole 1 MG tablet Commonly known as: REQUIP Take 1 tablet by mouth twice daily   spironolactone 25 MG tablet Commonly known as: ALDACTONE Take 1 tablet by mouth once daily   traZODone 100 MG tablet Commonly known as: DESYREL TAKE 1 TABLET BY MOUTH NIGHTLY AT BEDTIME AS NEEDED FOR INSOMNIA        Follow-up Information     Margaretann Loveless, MD. Go on 08/06/2023.   Specialty: Internal Medicine Why: @ 3:30pm Contact information: [address] [place] [address] [phone]         Annice Needy, MD. Schedule an appointment as soon as possible for a visit in 3 week(s).   Specialties: Vascular Surgery, Radiology, Interventional Cardiology Why: No answer at office patient to make own follow up appt  f/u PE/DVT Contact information: [address] [place] [address] [phone]                Discharge Exam: Patricia Knox Weights   07/23/23 0913 07/23/23 0920  Weight: 65.8 kg 65.8 kg   Alert and oriented times three respiratory clear to auscultation  cardiovascular both heart sounds normal no murmur neuro- exam grossly intact skin warm and dry   Condition at discharge: fair  The results of significant diagnostics from this hospitalization (including imaging, microbiology, ancillary and laboratory) are listed below for reference.   Imaging Studies: ECHOCARDIOGRAM COMPLETE Result Date: 07/23/2023    ECHOCARDIOGRAM REPORT   Patient Name:   Patricia Knox Date of Exam: 07/23/2023 Medical Rec #:  [id number]     Height:       62.0 in Accession #:    [id number]    Weight:       145.0 lb Date of Birth:  07-16-1935    BSA:          1.667 m Patient Age:    87 years      BP:            141/89 mmHg Patient Gender: F             HR:           77 bpm. Exam Location:  [facility] Procedure: 2D Echo, Cardiac Doppler, Color Doppler and Strain Analysis (Both            Spectral and Color Flow Doppler were utilized during procedure). Indications:     Pulmonary embolus  History:         Patient has no prior history of Echocardiogram examinations.                  Risk Factors:Hypertension and Dyslipidemia. Pulmonary emboli.  Sonographer:     Mikki Harbor Referring Phys:  [id number] Emeline General Diagnosing Phys: Windell Norfolk  Sonographer Comments: Global longitudinal strain was attempted. IMPRESSIONS  1. Left ventricular ejection fraction, by estimation, is 60 to 65%. The left  ventricle has normal function. The left ventricle has no regional wall motion abnormalities. Left ventricular diastolic parameters are consistent with Grade I diastolic dysfunction (impaired relaxation).  2. Right ventricular systolic function is normal. The right ventricular size is mildly enlarged. There is mildly elevated pulmonary artery systolic pressure.  3. The mitral valve is normal in structure. Trivial mitral valve regurgitation.  4. The aortic valve is tricuspid. Aortic valve regurgitation is not visualized. Aortic valve sclerosis is present, with no evidence of aortic valve stenosis.  5. Aortic dilatation noted. There is mild dilatation of the ascending aorta, measuring 40 mm. FINDINGS  Left Ventricle: Left ventricular ejection fraction, by estimation, is 60 to 65%. The left ventricle has normal function. The left ventricle has no regional wall motion abnormalities. The left ventricular internal cavity size was normal in size. There is  no left ventricular hypertrophy. Left ventricular diastolic parameters are consistent with Grade I diastolic dysfunction (impaired relaxation). Right Ventricle: The right ventricular size is mildly enlarged. No increase in right ventricular wall thickness. Right ventricular systolic  function is normal. There is mildly elevated pulmonary artery systolic pressure. The tricuspid regurgitant velocity is 2.81 m/s, and with an assumed right atrial pressure of 5 mmHg, the estimated right ventricular systolic pressure is 36.6 mmHg. Left Atrium: Left atrial size was normal in size. Right Atrium: Right atrial size was normal in size. Pericardium: There is no evidence of pericardial effusion. Mitral Valve: The mitral valve is normal in structure. Trivial mitral valve regurgitation. MV peak gradient, 9.5 mmHg. The mean mitral valve gradient is 4.0 mmHg. Tricuspid Valve: The tricuspid valve is normal in structure. Tricuspid valve regurgitation is mild. Aortic Valve: The aortic valve is tricuspid. Aortic valve regurgitation is not visualized. Aortic valve sclerosis is present, with no evidence of aortic valve stenosis. Aortic valve mean gradient measures 9.0 mmHg. Aortic valve peak gradient measures 16.1 mmHg. Aortic valve area, by VTI measures 1.67 cm. Pulmonic Valve: The pulmonic valve was not well visualized. Pulmonic valve regurgitation is trivial. Aorta: The aortic root is normal in size and structure and aortic dilatation noted. There is mild dilatation of the ascending aorta, measuring 40 mm. IAS/Shunts: The atrial septum is grossly normal. Additional Comments: 3D imaging was not performed.  LEFT VENTRICLE PLAX 2D LVIDd:         3.80 cm   Diastology LVIDs:         2.30 cm   LV e' medial:    7.51 cm/s LV PW:         1.00 cm   LV E/e' medial:  16.8 LV IVS:        0.90 cm   LV e' lateral:   12.20 cm/s LVOT diam:     1.90 cm   LV E/e' lateral: 10.3 LV SV:         79 LV SV Index:   47 LVOT Area:     2.84 cm  RIGHT VENTRICLE RV Basal diam:  3.65 cm RV Mid diam:    3.40 cm RV S prime:     15.90 cm/s TAPSE (M-mode): 2.6 cm LEFT ATRIUM             Index        RIGHT ATRIUM           Index LA diam:        3.40 cm 2.04 cm/m   RA Area:     15.00 cm LA Vol (A2C):   40.1 ml 24.05 ml/m  RA Volume:   33.80 ml   20.27 ml/m LA Vol (A4C):   56.4 ml 33.82 ml/m LA Biplane Vol: 48.0 ml 28.79 ml/m  AORTIC VALVE                     PULMONIC VALVE AV Area (Vmax):    1.94 cm      PV Vmax:       0.99 m/s AV Area (Vmean):   1.49 cm      PV Peak grad:  3.9 mmHg AV Area (VTI):     1.67 cm AV Vmax:           200.50 cm/s AV Vmean:          142.000 cm/s AV VTI:            0.474 m AV Peak Grad:      16.1 mmHg AV Mean Grad:      9.0 mmHg LVOT Vmax:         137.00 cm/s LVOT Vmean:        74.400 cm/s LVOT VTI:          0.279 m LVOT/AV VTI ratio: 0.59  AORTA Ao Root diam: 3.30 cm Ao Asc diam:  4.00 cm MITRAL VALVE                TRICUSPID VALVE MV Area (PHT): 5.20 cm     TR Peak grad:   31.6 mmHg MV Area VTI:   2.29 cm     TR Vmax:        281.00 cm/s MV Peak grad:  9.5 mmHg MV Mean grad:  4.0 mmHg     SHUNTS MV Vmax:       1.54 m/s     Systemic VTI:  0.28 m MV Vmean:      84.3 cm/s    Systemic Diam: 1.90 cm MV Decel Time: 146 msec MV E velocity: 126.00 cm/s MV A velocity: 134.00 cm/s MV E/A ratio:  0.94 Windell Norfolk Electronically signed by Windell Norfolk Signature Date/Time: 07/23/2023/4:58:04 PM    Final    US Venous Img Lower Bilateral (DVT) Result Date: 07/23/2023 CLINICAL DATA:  Pulmonary embolism.  Lower extremity edema. EXAM: BILATERAL LOWER EXTREMITY VENOUS DOPPLER ULTRASOUND TECHNIQUE: Gray-scale sonography with graded compression, as well as color Doppler and duplex ultrasound were performed to evaluate the lower extremity deep venous systems from the level of the common femoral vein and including the common femoral, femoral, profunda femoral, popliteal and calf veins including the posterior tibial, peroneal and gastrocnemius veins when visible. The superficial great saphenous vein was also interrogated. Spectral Doppler was utilized to evaluate flow at rest and with distal augmentation maneuvers in the common femoral, femoral and popliteal veins. COMPARISON:  None Available. FINDINGS: RIGHT LOWER EXTREMITY Common Femoral  Vein: No evidence of thrombus. Normal compressibility, respiratory phasicity and response to augmentation. Saphenofemoral Junction: No evidence of thrombus. Normal compressibility and flow on color Doppler imaging. Profunda Femoral Vein: No evidence of thrombus. Normal compressibility and flow on color Doppler imaging. Femoral Vein: No evidence of thrombus. Normal compressibility, respiratory phasicity and response to augmentation. Popliteal Vein: No evidence of thrombus. Normal compressibility, respiratory phasicity and response to augmentation. Calf Veins: No evidence of thrombus. Normal compressibility and flow on color Doppler imaging. Superficial Great Saphenous Vein: No evidence of thrombus. Normal compressibility. Venous Reflux:  None. Other Findings: No evidence of superficial thrombophlebitis or abnormal fluid collection. LEFT LOWER EXTREMITY Common Femoral Vein: Nonocclusive thrombus  near the saphenofemoral junction. Saphenofemoral Junction: Thrombus extending into the thigh segment of the great saphenous vein. Profunda Femoral Vein: No evidence of thrombus. Normal compressibility and flow on color Doppler imaging. Femoral Vein: No evidence of thrombus. Normal compressibility, respiratory phasicity and response to augmentation. Popliteal Vein: Nonocclusive thrombus in the left popliteal vein. Calf Veins: No evidence of thrombus. Normal compressibility and flow on color Doppler imaging. Superficial Great Saphenous Vein: No evidence of thrombus. Normal compressibility. Venous Reflux:  None. Other Findings: Left popliteal fossa Baker's cyst measures approximately 5.2 x 1.3 x 2.7 cm. IMPRESSION: 1. Nonocclusive thrombus in the left common femoral vein near the saphenofemoral junction. Thrombus extends into the thigh segment of the left great saphenous vein. 2. Nonocclusive thrombus in the left popliteal vein. 3. No evidence of right lower extremity deep venous thrombosis. 4. Left popliteal fossa Baker's cyst  measuring approximately 5.2 x 1.3 x 2.7 cm. Electronically Signed   By: Irish Lack M.D.   On: 07/23/2023 15:02   CT Angio Chest PE W and/or Wo Contrast Addendum Date: 07/23/2023 ADDENDUM REPORT: 07/23/2023 11:53 ADDENDUM: Study discussed by telephone with Dr. Delton Prairie on 07/23/2023 at 1139 hours. Electronically Signed   By: Odessa Fleming M.D.   On: 07/23/2023 11:53   Result Date: 07/23/2023 CLINICAL DATA:  88 year old female with chest pain. EXAM: CT ANGIOGRAPHY CHEST WITH CONTRAST TECHNIQUE: Multidetector CT imaging of the chest was performed using the standard protocol during bolus administration of intravenous contrast. Multiplanar CT image reconstructions and MIPs were obtained to evaluate the vascular anatomy. RADIATION DOSE REDUCTION: This exam was performed according to the departmental dose-optimization program which includes automated exposure control, adjustment of the mA and/or kV according to patient size and/or use of iterative reconstruction technique. CONTRAST:  75mL OMNIPAQUE IOHEXOL 350 MG/ML SOLN COMPARISON:  Chest radiographs 0941 hours today. FINDINGS: Cardiovascular: Good contrast bolus timing in the pulmonary arterial tree. Bilateral pulmonary emboli. No saddle embolus. No lobar thrombosis. But widespread segmental pulmonary clot in the upper lobes, right middle lobe. Superimposed tortuous aorta with advanced calcified atherosclerosis. Calcified coronary artery atherosclerosis. No pericardial effusion. Mediastinum/Nodes: Large gastric hiatal hernia with intrathoracic stomach and mesentery. No superimposed mediastinal mass or lymphadenopathy. Lungs/Pleura: Major airways are patent. Subtotal right lower lobe atelectasis is due to combined compression from intrathoracic stomach, moderate layering right pleural effusion with simple fluid density. There is confluent and peripheral ground-glass opacity in the right upper lobe suspicious for developing pulmonary infarct. And there is mosaic  attenuation elsewhere in both upper lobes. Right middle lobe lateral segment enhancing atelectasis. No left pleural effusion. Negative left lower lobe. Upper Abdomen: Fluid density circumscribed area in the anterior liver on series 4, image 131 is favored to be benign cyst (no follow-up imaging recommended). Negative visible spleen, pancreas, adrenal glands and kidneys. Intrathoracic stomach, negative visible other bowel in the upper abdomen. No free air or free fluid. Musculoskeletal: Advanced thoracic spine degeneration superimposed on some levels of interbody ankylosis. Pronounced vacuum disc at T11-T12. No acute or suspicious osseous lesion is identified. Review of the MIP images confirms the above findings. IMPRESSION: 1. Positive for bilateral segmental pulmonary emboli. No saddle embolus or lobar thrombosis. Evidence of right upper lobe developing pulmonary infarct. Small to moderate layering right pleural effusion appears to be transudate. 2. Superimpose intrathoracic mass effect from very Large hiatal hernia with intrathoracic stomach and mesentery. Right lower and middle lobe atelectasis. 3. Aortic Atherosclerosis (ICD10-I70.0). Coronary artery atherosclerosis. Electronically Signed: By: Odessa Fleming M.D. On:  07/23/2023 11:35   DG Chest 2 View Result Date: 07/23/2023 CLINICAL DATA:  Chest pain. EXAM: CHEST - 2 VIEW COMPARISON:  None Available. FINDINGS: Mild cardiomegaly is noted. Left lung is clear. Small right pleural effusion is noted with associated right basilar atelectasis or infiltrate. Bony thorax is unremarkable. IMPRESSION: Small right pleural effusion with adjacent right basilar atelectasis or infiltrate. Electronically Signed   By: Lupita Raider M.D.   On: 07/23/2023 10:20    Microbiology: Results for orders placed or performed during the hospital encounter of 07/23/23  Urine Culture     Status: Abnormal   Collection Time: 07/23/23 10:25 AM   Specimen: Urine, Clean Catch  Result Value  Ref Range Status   Specimen Description   Final    URINE, CLEAN CATCH Performed at [facility], [address] [place], [place] [address]    Special Requests   Final    NONE Performed at [facility], [address] [place], [place] [address]    Culture MULTIPLE SPECIES PRESENT, SUGGEST RECOLLECTION (A)  Final   Report Status 07/24/2023 FINAL  Final  Blood culture (single)     Status: None (Preliminary result)   Collection Time: 07/23/23 10:42 AM   Specimen: BLOOD  Result Value Ref Range Status   Specimen Description BLOOD RIGHT ANTECUBITAL  Final   Special Requests   Final    BOTTLES DRAWN AEROBIC AND ANAEROBIC Blood Culture adequate volume   Culture   Final    NO GROWTH < 24 HOURS Performed at [facility], [address] [place], [place] [address]    Report Status PENDING  Incomplete    Labs: CBC: Recent Labs  Lab 07/23/23 0936 07/24/23 0441  WBC 10.7* 9.4  HGB 12.3 10.9*  HCT 37.1 31.8*  MCV 89.0 86.4  PLT 278 302   Basic Metabolic Panel: Recent Labs  Lab 07/23/23 0936  NA 139  K 3.5  CL 102  CO2 24  GLUCOSE 101*  BUN 12  CREATININE 1.01*  CALCIUM 9.0   Liver Function Tests: Recent Labs  Lab 07/23/23 0936  AST 27  ALT 19  ALKPHOS 79  BILITOT 1.1  PROT 7.3  ALBUMIN 3.2*    Discharge time spent: greater than 30 minutes.  Signed: Enedina Finner, MD [facility] 07/24/2023

## 2023-07-24 NOTE — Plan of Care (Signed)

## 2023-07-24 NOTE — Plan of Care (Signed)

## 2023-07-24 NOTE — Consult Note (Signed)
Hospital Consult    Reason for Consult:  Pulmonary embolism and RT Lower DVT Requesting Physician:  Dr Enedina Finner MD MRN #:  [id number]  History of Present Illness: This is a 88 y.o. female who presents with her daughter for evaluation of about 4 days of right sided chest pain beneath her right breast.  Reports some dyspnea on exertion but no cough.  No fevers.  No abdominal pain, emesis or urinary changes such as dysuria, frequency, no incontinence or urgency. Patients daughter shows me paperwork from an urgent care visit from this past Monday. She was seen for the same right flank pain and diagnosed with pyelonephritis and discharged with a prescription for nitrofurantoin. UA dipstick with large leukocytes but negative leuk esterases, negative nitrites.   Upon workup patient underwent CTA of the chest with PE protocol and found to have bilateral segmental pulmonary emboli.  No saddle embolus or lobar thrombosis noted.  Skin did show possible developing pulmonary infarct of the right upper lobe.  No right heart strain noted.  Vascular surgery was consulted to evaluate.  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   COLONOSCOPY  2002   TOE SURGERY  1999    No Known Allergies  Prior to Admission medications   Medication Sig Start Date End Date Taking? Authorizing Provider  alendronate (FOSAMAX) 70 MG tablet TAKE 1 TABLET AS SINGLE DOSE WEEKLY 05/21/23  Yes Margaretann Loveless, MD  amLODipine (NORVASC) 5 MG tablet Take 1 tablet by mouth once daily 02/24/23  Yes Margaretann Loveless, MD  aspirin 325 MG tablet Take 325 mg by mouth daily.   Yes [provider]  atorvastatin (LIPITOR) 40 MG tablet TAKE 1 TABLET BY MOUTH ONCE DAILY AT BEDTIME 06/29/23  Yes Margaretann Loveless, MD  Calcium Carbonate-Vitamin D (CALTRATE 600+D PO) Take 1 capsule by mouth 2 (two) times daily.   Yes [provider]  gabapentin (NEURONTIN) 100 MG capsule TAKE 3 CAPSULES BY MOUTH AT BEDTIME 07/06/23  Yes Levert Feinstein, MD  nitrofurantoin, macrocrystal-monohydrate, (MACROBID) 100 MG capsule Take 100 mg by mouth 2 (two) times daily. 07/21/23  Yes [provider]  omeprazole (PRILOSEC) 20 MG capsule Take 1 capsule by mouth twice daily 07/02/23  Yes Margaretann Loveless, MD  rOPINIRole (REQUIP) 1 MG tablet Take 1 tablet by mouth twice daily 06/01/23  Yes Margaretann Loveless, MD  spironolactone (ALDACTONE) 25 MG tablet Take 1 tablet by mouth once daily 02/15/23 02/15/24 Yes Margaretann Loveless, MD  traZODone (DESYREL) 100  MG tablet TAKE 1 TABLET BY MOUTH NIGHTLY AT BEDTIME AS NEEDED FOR INSOMNIA 05/11/23  Yes Margaretann Loveless, MD  abobotulinumtoxinA (DYSPORT) 300 units SOLR injection Inject 300 Units into the muscle every 3 (three) months. 12/04/21   Levert Feinstein, MD    Social History   Socioeconomic History   Marital status: Single    Spouse name: Not on file   Number of children: 3   Years of education: HS   Highest education level: Not on file  Occupational History   Occupation: Retired  Tobacco Use   Smoking status: Never   Smokeless tobacco: Never   Substance and Sexual Activity   Alcohol use: No   Drug use: No   Sexual activity: Not on file  Other Topics Concern   Not on file  Social History Narrative   Lives at home with husband.   Right-handed.   No daily caffeine use.   Social Drivers of Corporate investment banker Strain: Not on file  Food Insecurity: No Food Insecurity (07/23/2023)   Hunger Vital Sign    Worried About Running Out of Food in the Last Year: Never true    Ran Out of Food in the Last Year: Never true  Transportation Needs: No Transportation Needs (07/23/2023)   PRAPARE - Administrator, Civil Service (Medical): No    Lack of Transportation (Non-Medical): No  Physical Activity: Not on file  Stress: Not on file  Social Connections: Unknown (07/23/2023)   Social Connection and Isolation Panel [NHANES]    Frequency of Communication with Friends and Family: More than three times a week    Frequency of Social Gatherings with Friends and Family: More than three times a week    Attends Religious Services: Patient declined    Database administrator or Organizations: Patient declined    Attends Banker Meetings: Patient declined    Marital Status: Widowed  Intimate Partner Violence: Not At Risk (07/23/2023)   Humiliation, Afraid, Rape, and Kick questionnaire    Fear of Current or Ex-Partner: No    Emotionally Abused: No    Physically Abused: No    Sexually Abused: No     Family History  Problem Relation Age of Onset   Stroke Mother    Heart attack Father    Breast cancer Neg Hx     ROS: Otherwise negative unless mentioned in HPI  Physical Examination  Vitals:   07/24/23 0451 07/24/23 0830  BP: 133/80 128/84  Pulse: 84 78  Resp: 18 16  Temp: 98.3 F (36.8 C) 97.6 F (36.4 C)  SpO2: 93% 98%   Body mass index is 26.52 kg/m.  General:  WDWN in NAD Gait: Not observed HENT: WNL, normocephalic Pulmonary: normal non-labored breathing, without Rales, rhonchi,   wheezing Cardiac: regular, without  Murmurs, rubs or gallops; without carotid bruits Abdomen: Positive bowel sounds throughout, soft, NT/ND, no masses Skin: without rashes Vascular Exam/Pulses: Palpable pulses throughout Extremities: without ischemic changes, without Gangrene , without cellulitis; without open wounds;  Musculoskeletal: no muscle wasting or atrophy  Neurologic: A&O X 3;  No focal weakness or paresthesias are detected; speech is fluent/normal Psychiatric:  The pt has Normal affect. Lymph:  Unremarkable  CBC    Component Value Date/Time   WBC 9.4 07/24/2023 0441   RBC 3.68 (L) 07/24/2023 0441   HGB 10.9 (L) 07/24/2023 0441   HGB 12.4 10/02/2022 1322   HCT 31.8 (L) 07/24/2023 0441   HCT 38.4  10/02/2022 1322   PLT 302 07/24/2023 0441   MCV 86.4 07/24/2023 0441   MCV 89 10/02/2022 1322   MCH 29.6 07/24/2023 0441   MCHC 34.3 07/24/2023 0441   RDW 13.2 07/24/2023 0441   RDW 13.2 10/02/2022 1322   LYMPHSABS 1.8 10/02/2022 1322   EOSABS 0.0 10/02/2022 1322   BASOSABS 0.1 10/02/2022 1322    BMET    Component Value Date/Time   NA 139 07/23/2023 0936   NA 140 10/02/2022 1322   K 3.5 07/23/2023 0936   CL 102 07/23/2023 0936   CO2 24 07/23/2023 0936   GLUCOSE 101 (H) 07/23/2023 0936   BUN 12 07/23/2023 0936   BUN 16 10/02/2022 1322   CREATININE 1.01 (H) 07/23/2023 0936   CALCIUM 9.0 07/23/2023 0936   GFRNONAA 55 (L) 07/23/2023 0936   GFRAA 51 (L) 03/12/2016 1443    COAGS: Lab Results  Component Value Date   INR 1.0 07/23/2023     Non-Invasive Vascular Imaging:   EXAM:07/23/23 CT ANGIOGRAPHY CHEST WITH CONTRAST   TECHNIQUE: Multidetector CT imaging of the chest was performed using the standard protocol during bolus administration of intravenous contrast. Multiplanar CT image reconstructions and MIPs were obtained to evaluate the vascular anatomy.   RADIATION DOSE REDUCTION: This exam was performed according to the departmental dose-optimization  program which includes automated exposure control, adjustment of the mA and/or kV according to patient size and/or use of iterative reconstruction technique.   CONTRAST:  75mL OMNIPAQUE IOHEXOL 350 MG/ML SOLN   COMPARISON:  Chest radiographs 0941 hours today.   FINDINGS: Cardiovascular: Good contrast bolus timing in the pulmonary arterial tree. Bilateral pulmonary emboli.   No saddle embolus. No lobar thrombosis. But widespread segmental pulmonary clot in the upper lobes, right middle lobe.   Superimposed tortuous aorta with advanced calcified atherosclerosis. Calcified coronary artery atherosclerosis. No pericardial effusion.   Mediastinum/Nodes: Large gastric hiatal hernia with intrathoracic stomach and mesentery. No superimposed mediastinal mass or lymphadenopathy.   Lungs/Pleura: Major airways are patent. Subtotal right lower lobe atelectasis is due to combined compression from intrathoracic stomach, moderate layering right pleural effusion with simple fluid density. There is confluent and peripheral ground-glass opacity in the right upper lobe suspicious for developing pulmonary infarct. And there is mosaic attenuation elsewhere in both upper lobes. Right middle lobe lateral segment enhancing atelectasis. No left pleural effusion. Negative left lower lobe.   Upper Abdomen: Fluid density circumscribed area in the anterior liver on series 4, image 131 is favored to be benign cyst (no follow-up imaging recommended). Negative visible spleen, pancreas, adrenal glands and kidneys. Intrathoracic stomach, negative visible other bowel in the upper abdomen. No free air or free fluid.   Musculoskeletal: Advanced thoracic spine degeneration superimposed on some levels of interbody ankylosis. Pronounced vacuum disc at T11-T12. No acute or suspicious osseous lesion is identified.   Review of the MIP images confirms the above findings.   IMPRESSION: 1. Positive for bilateral  segmental pulmonary emboli. No saddle embolus or lobar thrombosis. Evidence of right upper lobe developing pulmonary infarct. Small to moderate layering right pleural effusion appears to be transudate. 2. Superimpose intrathoracic mass effect from very Large hiatal hernia with intrathoracic stomach and mesentery. Right lower and middle lobe atelectasis. 3. Aortic Atherosclerosis (ICD10-I70.0). Coronary artery atherosclerosis.  EXAM:07/23/23 BILATERAL LOWER EXTREMITY VENOUS DOPPLER ULTRASOUND   TECHNIQUE: Gray-scale sonography with graded compression, as well as color Doppler and duplex ultrasound were performed to evaluate the lower extremity deep venous systems from the level of  the common femoral vein and including the common femoral, femoral, profunda femoral, popliteal and calf veins including the posterior tibial, peroneal and gastrocnemius veins when visible. The superficial great saphenous vein was also interrogated. Spectral Doppler was utilized to evaluate flow at rest and with distal augmentation maneuvers in the common femoral, femoral and popliteal veins.   COMPARISON:  None Available.   FINDINGS: RIGHT LOWER EXTREMITY   Common Femoral Vein: No evidence of thrombus. Normal compressibility, respiratory phasicity and response to augmentation.   Saphenofemoral Junction: No evidence of thrombus. Normal compressibility and flow on color Doppler imaging.   Profunda Femoral Vein: No evidence of thrombus. Normal compressibility and flow on color Doppler imaging.   Femoral Vein: No evidence of thrombus. Normal compressibility, respiratory phasicity and response to augmentation.   Popliteal Vein: No evidence of thrombus. Normal compressibility, respiratory phasicity and response to augmentation.   Calf Veins: No evidence of thrombus. Normal compressibility and flow on color Doppler imaging.   Superficial Great Saphenous Vein: No evidence of thrombus.  Normal compressibility.   Venous Reflux:  None.   Other Findings: No evidence of superficial thrombophlebitis or abnormal fluid collection.   LEFT LOWER EXTREMITY   Common Femoral Vein: Nonocclusive thrombus near the saphenofemoral junction.   Saphenofemoral Junction: Thrombus extending into the thigh segment of the great saphenous vein.   Profunda Femoral Vein: No evidence of thrombus. Normal compressibility and flow on color Doppler imaging.   Femoral Vein: No evidence of thrombus. Normal compressibility, respiratory phasicity and response to augmentation.   Popliteal Vein: Nonocclusive thrombus in the left popliteal vein.   Calf Veins: No evidence of thrombus. Normal compressibility and flow on color Doppler imaging.   Superficial Great Saphenous Vein: No evidence of thrombus. Normal compressibility.   Venous Reflux:  None.   Other Findings: Left popliteal fossa Baker's cyst measures approximately 5.2 x 1.3 x 2.7 cm.   IMPRESSION: 1. Nonocclusive thrombus in the left common femoral vein near the saphenofemoral junction. Thrombus extends into the thigh segment of the left great saphenous vein. 2. Nonocclusive thrombus in the left popliteal vein. 3. No evidence of right lower extremity deep venous thrombosis. 4. Left popliteal fossa Baker's cyst measuring approximately 5.2 x 1.3 x 2.7 cm.   Statin:  Yes.   Beta Blocker:  No. Aspirin:  Yes.   ACEI:  Yes.   ARB:  No. CCB use:  No Other antiplatelets/anticoagulants:  No.    ASSESSMENT/PLAN: This is a 88 y.o. female who presents to [facility] emergency department after 4 days of right sided chest pain.  Upon workup she was noted to have bilateral segmental pulmonary emboli.  She also was noted to have right lower extremity femoral chronic DVTs extending into the popliteal area that are nonocclusive.  On exam this morning she was sitting on the side of the bed with her daughter.  Nonlabored breathing without the use of  any oxygen support.  O2 saturations were 94 to 96%.  Both the patient and the daughter endorse that walking long distances makes her short of breath but her ability to ambulate short distances is not a problem.  She denies any lower extremity pain either walking or at rest.  He does endorse pain to the right side of her chest if she takes a deep breath.  I had a long discussion with both the patient and the daughter at the bedside this morning concerning her pulmonary embolism and her right lower extremity DVTs.  At this time vascular surgery  does not recommend any intervention such as pulmonary thrombectomy or right lower extremity thrombectomy.  Patient's vital signs are stable and she is asymptomatic at this time.  She is not requiring any oxygen and can ambulate without any difficulties.  Patient is currently on a heparin infusion.  We recommend discontinuing the heparin infusion and starting on oral anticoagulation such as Eliquis at 10 mg twice a day for 5 days then decreasing dose to 5 mg twice a day indefinitely.  Once this is completed we are okay with patient discharging to home when medically stable.  Patient to follow-up with vein and vascular surgery in 6 weeks or sooner if needed.   -I discussed the patient's plan of care in detail with Dr. Festus Barren MD and he is in agreement with the plan.   Marcie Bal [facility] 07/24/2023 9:44 AM

## 2023-07-24 NOTE — Progress Notes (Deleted)
Walking test performed, pt noted to have dyspnea with exertion, however able to tolerate ambulation without difficulty. O2 sat checked, 96% on room air.

## 2023-07-24 NOTE — Consult Note (Signed)
PHARMACY - ANTICOAGULATION CONSULT NOTE  Pharmacy Consult for Heparin Indication: pulmonary embolus  No Known Allergies  Patient Measurements: Height: 5\' 2"  (157.5 cm) Weight: 65.8 kg (145 lb) IBW/kg (Calculated) : 50.1 Heparin Dosing Weight: 63.6 kg  Vital Signs: Temp: 98.3 F (36.8 C) (02/21 0451) Temp Source: Oral (02/20 2126) BP: 133/80 (02/21 0451) Pulse Rate: 84 (02/21 0451)  Labs: Recent Labs    07/23/23 0936 07/23/23 1129 07/23/23 1222 07/23/23 2016 07/24/23 0441 07/24/23 0555  HGB 12.3  --   --   --  10.9*  --   HCT 37.1  --   --   --  31.8*  --   PLT 278  --   --   --  302  --   APTT  --   --  32  --   --   --   LABPROT  --   --  13.5  --   --   --   INR  --   --  1.0  --   --   --   HEPARINUNFRC  --   --   --  0.86*  --  0.68  CREATININE 1.01*  --   --   --   --   --   TROPONINIHS 24* 20*  --   --   --   --     Estimated Creatinine Clearance: 36.3 mL/min (A) (by C-G formula based on SCr of 1.01 mg/dL (H)).   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 female with medical history significant of remote history of PE, HTN, HLD, breast cancer status post lumpectomy, restless leg syndrome, presented with worsening chest pain. CT positive for bilateral segmental acute pulmonary embolism. Pharmacy has been consulted to initiate and monitor heparin infusion.  2/20 2016 HL 0.86 2/21 0555 HL 0.68, therapeutic x 1   Goal of Therapy:  Heparin level 0.3-0.7 units/ml Monitor platelets by anticoagulation protocol: Yes   Plan:  Continue heparin infusion at 950 units/hr Recheck heparin level in  8 hours to confirm CBC daily while on heparin.   Otelia Sergeant, PharmD, MBA 07/24/2023 7:01 AM

## 2023-07-24 NOTE — Discharge Instructions (Signed)
Take your eliquis as instructed.

## 2023-07-28 LAB — CULTURE, BLOOD (SINGLE)
Culture: NO GROWTH
Special Requests: ADEQUATE

## 2023-07-30 ENCOUNTER — Telehealth (INDEPENDENT_AMBULATORY_CARE_PROVIDER_SITE_OTHER): Payer: Self-pay

## 2023-07-30 NOTE — Telephone Encounter (Addendum)
 I spoke with the patient earlier this morning to get more details and a message was sent to a provider.

## 2023-07-30 NOTE — Telephone Encounter (Signed)
 I have attempted to call the patient about 4 times today and she is not answering any of the phone calls.  I have left a message indicating that I would contact her back.  The patient has been started on Eliquis and so I am not concerned that she is developing a new DVT because it is very rare and highly unlikely that she would develop a new DVT while she is on Eliquis.  In addition if it is a DVT she is already being treated because she is on Eliquis and so there is nothing additional that needs to be done at this time.  She did have a very large Baker's cyst in her left leg and so it is very possible that she has a Baker's cyst in her right and that is why she is feeling the swelling behind her knee.  Baker's cyst are not dangerous or limb-threatening and typically no intervention is done for these.  If the patient feels that these need urgent evaluation again she is welcome to present to the emergency room but if not we will see her on her follow-up visit.

## 2023-07-30 NOTE — Telephone Encounter (Signed)
 Patient daughter called and left message that she hasn't heard back from anyone about her mom's legs. She is unsure of what to do. She left [phone] for call back phone number, not phone number on chart. Please advise.

## 2023-07-30 NOTE — Telephone Encounter (Signed)
 Patient was seen in the ER last week 07/24/23 by Huel Cote for a Pulmonary embolism and RT Lower DVT.  She developed a puffy red spot on her lower right leg yesterday, it's sore but not painful. She has swelling from behind her knee going to the front.  She has follow up on 08/14/23 with Dr.Dew  Please advise

## 2023-07-30 NOTE — Telephone Encounter (Signed)
 Spoke with patient again. She stated that she spoke with a provider and they are still worried. I advised that they could go to the ER for an evaluation to easy their mind. The daughter declined and stated they will see what happens and will come to her appt on 08/14/23.

## 2023-08-05 ENCOUNTER — Other Ambulatory Visit: Payer: Self-pay | Admitting: Internal Medicine

## 2023-08-05 DIAGNOSIS — F411 Generalized anxiety disorder: Secondary | ICD-10-CM

## 2023-08-06 ENCOUNTER — Ambulatory Visit: Payer: Medicare Other | Admitting: Internal Medicine

## 2023-08-06 ENCOUNTER — Encounter: Payer: Self-pay | Admitting: Internal Medicine

## 2023-08-06 VITALS — BP 140/78 | HR 64 | Ht 62.0 in | Wt 140.0 lb

## 2023-08-06 DIAGNOSIS — L97812 Non-pressure chronic ulcer of other part of right lower leg with fat layer exposed: Secondary | ICD-10-CM | POA: Diagnosis not present

## 2023-08-06 DIAGNOSIS — I10 Essential (primary) hypertension: Secondary | ICD-10-CM | POA: Diagnosis not present

## 2023-08-06 DIAGNOSIS — E782 Mixed hyperlipidemia: Secondary | ICD-10-CM | POA: Diagnosis not present

## 2023-08-06 DIAGNOSIS — R7303 Prediabetes: Secondary | ICD-10-CM

## 2023-08-06 DIAGNOSIS — I2694 Multiple subsegmental pulmonary emboli without acute cor pulmonale: Secondary | ICD-10-CM | POA: Diagnosis not present

## 2023-08-06 DIAGNOSIS — I82412 Acute embolism and thrombosis of left femoral vein: Secondary | ICD-10-CM

## 2023-08-06 MED ORDER — FUROSEMIDE 20 MG PO TABS: 20.0000 mg | ORAL_TABLET | Freq: Every day | ORAL | 11 refills | Status: AC

## 2023-08-06 NOTE — Progress Notes (Signed)
 Established Patient Office Visit  Subjective:  Patient ID: Patricia Knox, female    DOB: [birth year]  Age: 88 y.o. MRN: [id number]  Chief Complaint  Patient presents with   Follow-up    Hospital follow up    Patient comes in for hospital follow-up.  She had presented to emergency room on 07/23/2023 with worsening chest pain.  Found to have bilateral segmental pulmonary emboli and acute left lower extremity DVT.  Initially treated with heparin and then discharged home on p.o. Eliquis.  Today patient is feeling better, no further chest pain or shortness of breath.  She also does not have any leg pain however she has bilateral leg swelling with stasis dermatitis.  She has been advised to wear compression stockings, which she will get today.  Will also add Lasix 20 mg for a few days.    No other concerns at this time.   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   COLONOSCOPY  2002   TOE SURGERY  1999    Social History   Socioeconomic History   Marital status: Single    Spouse name: Not on file   Number of  children: 3   Years of education: HS   Highest education level: Not on file  Occupational History   Occupation: Retired  Tobacco Use   Smoking status: Never   Smokeless tobacco: Never  Substance and Sexual Activity   Alcohol use: No   Drug use: No   Sexual activity: Not on file  Other Topics Concern   Not on file  Social History Narrative   Lives at home with husband.   Right-handed.   No daily caffeine use.   Social Drivers of Corporate investment banker Strain: Not on file  Food Insecurity: No Food Insecurity (07/23/2023)   Hunger Vital Sign    Worried About Running Out of Food in the Last Year: Never true    Ran Out of Food in the Last Year: Never true  Transportation Needs: No Transportation Needs (07/23/2023)   PRAPARE - Administrator, Civil Service (Medical): No    Lack of Transportation (Non-Medical): No  Physical Activity: Not on file  Stress: Not on file  Social Connections: Unknown (07/23/2023)   Social Connection and Isolation Panel [NHANES]    Frequency of Communication with Friends and Family: More than three times a week    Frequency of Social Gatherings with Friends and Family: More than three times a week    Attends Religious  Services: Patient declined    Active Member of Clubs or Organizations: Patient declined    Attends Banker Meetings: Patient declined    Marital Status: Widowed  Intimate Partner Violence: Not At Risk (07/23/2023)   Humiliation, Afraid, Rape, and Kick questionnaire    Fear of Current or Ex-Partner: No    Emotionally Abused: No    Physically Abused: No    Sexually Abused: No    Family History  Problem Relation Age of Onset   Stroke Mother    Heart attack Father    Breast cancer Neg Hx     No Known Allergies  Outpatient Medications Prior to Visit  Medication Sig   apixaban (ELIQUIS) 5 MG TABS tablet Take 2 tablets (10 mg total) by mouth 2 (two) times daily for 7 days, THEN 1 tablet (5 mg total) 2 (two)  times daily.   abobotulinumtoxinA (DYSPORT) 300 units SOLR injection Inject 300 Units into the muscle every 3 (three) months.   alendronate (FOSAMAX) 70 MG tablet TAKE 1 TABLET AS SINGLE DOSE WEEKLY   amLODipine (NORVASC) 5 MG tablet Take 1 tablet by mouth once daily   aspirin EC 81 MG tablet Take 1 tablet (81 mg total) by mouth daily. Swallow whole.   atorvastatin (LIPITOR) 40 MG tablet TAKE 1 TABLET BY MOUTH ONCE DAILY AT BEDTIME   Calcium Carbonate-Vitamin D (CALTRATE 600+D PO) Take 1 capsule by mouth 2 (two) times daily.   gabapentin (NEURONTIN) 100 MG capsule TAKE 3 CAPSULES BY MOUTH AT BEDTIME   nitrofurantoin, macrocrystal-monohydrate, (MACROBID) 100 MG capsule Take 100 mg by mouth 2 (two) times daily.   omeprazole (PRILOSEC) 20 MG capsule Take 1 capsule by mouth twice daily   rOPINIRole (REQUIP) 1 MG tablet Take 1 tablet by mouth twice daily   spironolactone (ALDACTONE) 25 MG tablet Take 1 tablet by mouth once daily   traZODone (DESYREL) 100 MG tablet TAKE 1 TABLET BY MOUTH NIGHTLY AT BEDTIME AS NEEDED FOR INSOMNIA   No facility-administered medications prior to visit.    Review of Systems  Constitutional: Negative.  Negative for chills, fever and weight loss.  HENT: Negative.  Negative for hearing loss.   Eyes: Negative.   Respiratory: Negative.  Negative for cough, shortness of breath and stridor.   Cardiovascular: Negative.  Negative for chest pain, palpitations and leg swelling.  Gastrointestinal: Negative.  Negative for abdominal pain, constipation, diarrhea, heartburn, nausea and vomiting.  Genitourinary: Negative.  Negative for dysuria and flank pain.  Musculoskeletal: Negative.  Negative for joint pain and myalgias.  Skin: Negative.   Neurological: Negative.  Negative for dizziness, tingling, tremors and headaches.  Endo/Heme/Allergies: Negative.   Psychiatric/Behavioral: Negative.  Negative for depression and suicidal ideas. The patient is not nervous/anxious.         Objective:   BP (!) 140/78   Pulse 64   Ht 5\' 2"  (1.575 m)   Wt 140 lb (63.5 kg)   SpO2 96%   BMI 25.61 kg/m   Vitals:   08/06/23 1502  BP: (!) 140/78  Pulse: 64  Height: 5\' 2"  (1.575 m)  Weight: 140 lb (63.5 kg)  SpO2: 96%  BMI (Calculated): 25.6    Physical Exam Vitals and nursing note reviewed.  Constitutional:      Appearance: Normal appearance.  HENT:     Head: Normocephalic and atraumatic.     Nose: Nose normal.     Mouth/Throat:     Mouth: Mucous membranes are moist.  Pharynx: Oropharynx is clear.  Eyes:     Conjunctiva/sclera: Conjunctivae normal.     Pupils: Pupils are equal, round, and reactive to light.  Cardiovascular:     Rate and Rhythm: Normal rate and regular rhythm.     Pulses: Normal pulses.     Heart sounds: Normal heart sounds. No murmur heard. Pulmonary:     Effort: Pulmonary effort is normal.     Breath sounds: Normal breath sounds. No wheezing.  Abdominal:     General: Bowel sounds are normal.     Palpations: Abdomen is soft.     Tenderness: There is no abdominal tenderness. There is no right CVA tenderness or left CVA tenderness.  Musculoskeletal:        General: No swelling. Normal range of motion.     Cervical back: Normal range of motion.     Right lower leg: Edema present.     Left lower leg: Edema present.  Skin:    General: Skin is warm and dry.  Neurological:     General: No focal deficit present.     Mental Status: She is alert and oriented to person, place, and time.  Psychiatric:        Mood and Affect: Mood normal.        Behavior: Behavior normal.      No results found for any visits on 08/06/23.  Recent Results (from the past 2160 hours)  Basic metabolic panel     Status: Abnormal   Collection Time: 07/23/23  9:36 AM  Result Value Ref Range   Sodium 139 135 - 145 mmol/L   Potassium 3.5 3.5 - 5.1 mmol/L   Chloride 102 98 - 111 mmol/L   CO2 24 22 - 32 mmol/L   Glucose, Bld 101 (H) 70 - 99 mg/dL     Comment: Glucose reference range applies only to samples taken after fasting for at least 8 hours.   BUN 12 8 - 23 mg/dL   Creatinine, Ser 9.14 (H) 0.44 - 1.00 mg/dL   Calcium 9.0 8.9 - 78.2 mg/dL   GFR, Estimated 55 (L) >60 mL/min    Comment: (NOTE) Calculated using the CKD-EPI Creatinine Equation (2021)    Anion gap 13 5 - 15    Comment: Performed at [facility], [address] [place], [place] [address]  CBC     Status: Abnormal   Collection Time: 07/23/23  9:36 AM  Result Value Ref Range   WBC 10.7 (H) 4.0 - 10.5 K/uL   RBC 4.17 3.87 - 5.11 MIL/uL   Hemoglobin 12.3 12.0 - 15.0 g/dL   HCT 30.8 65.7 - 84.6 %   MCV 89.0 80.0 - 100.0 fL   MCH 29.5 26.0 - 34.0 pg   MCHC 33.2 30.0 - 36.0 g/dL   RDW 96.2 95.2 - 84.1 %   Platelets 278 150 - 400 K/uL   nRBC 0.0 0.0 - 0.2 %    Comment: Performed at [facility], [address] [place], [place] [address]  Troponin I (High Sensitivity)     Status: Abnormal   Collection Time: 07/23/23  9:36 AM  Result Value Ref Range   Troponin I (High Sensitivity) 24 (H) <18 ng/L    Comment: (NOTE) Elevated high sensitivity troponin I (hsTnI) values and significant  changes across serial measurements may suggest ACS but many other  chronic and acute conditions are known to elevate hsTnI results.  Refer to the "Links" section for chest pain algorithms  and additional  guidance. Performed at [facility], [address] [place], [place] [address]   Hepatic function panel     Status: Abnormal   Collection Time: 07/23/23  9:36 AM  Result Value Ref Range   Total Protein 7.3 6.5 - 8.1 g/dL   Albumin 3.2 (L) 3.5 - 5.0 g/dL   AST 27 15 - 41 U/L   ALT 19 0 - 44 U/L   Alkaline Phosphatase 79 38 - 126 U/L   Total Bilirubin 1.1 0.0 - 1.2 mg/dL   Bilirubin, Direct 0.2 0.0 - 0.2 mg/dL   Indirect Bilirubin 0.9 0.3 - 0.9 mg/dL    Comment: Performed at [facility], [address] [place], [place] [address]   Urinalysis, Routine w reflex microscopic -Urine, Clean Catch     Status: Abnormal   Collection Time: 07/23/23 10:25 AM  Result Value Ref Range   Color, Urine YELLOW (A) YELLOW   APPearance HAZY (A) CLEAR   Specific Gravity, Urine 1.011 1.005 - 1.030   pH 7.0 5.0 - 8.0   Glucose, UA NEGATIVE NEGATIVE mg/dL   Hgb urine dipstick NEGATIVE NEGATIVE   Bilirubin Urine NEGATIVE NEGATIVE   Ketones, ur 5 (A) NEGATIVE mg/dL   Protein, ur NEGATIVE NEGATIVE mg/dL   Nitrite NEGATIVE NEGATIVE   Leukocytes,Ua LARGE (A) NEGATIVE   RBC / HPF 0-5 0 - 5 RBC/hpf   WBC, UA 21-50 0 - 5 WBC/hpf   Bacteria, UA RARE (A) NONE SEEN   Squamous Epithelial / HPF 0-5 0 - 5 /HPF    Comment: Performed at [facility], [address], [place], [place] [address]  Urine Culture     Status: Abnormal   Collection Time: 07/23/23 10:25 AM   Specimen: Urine, Clean Catch  Result Value Ref Range   Specimen Description      URINE, CLEAN CATCH Performed at [facility], [address], [place], [place] [address]    Special Requests      NONE Performed at [facility], [address], [place], [place] [address]    Culture MULTIPLE SPECIES PRESENT, SUGGEST RECOLLECTION (A)    Report Status 07/24/2023 FINAL   Lactic acid, plasma     Status: None   Collection Time: 07/23/23 10:42 AM  Result Value Ref Range   Lactic Acid, Venous 1.5 0.5 - 1.9 mmol/L    Comment: Performed at [facility], [address] [place], [place] [address]  Procalcitonin     Status: None   Collection Time: 07/23/23 10:42 AM  Result Value Ref Range   Procalcitonin <0.10 ng/mL    Comment:        Interpretation: PCT (Procalcitonin) <= 0.5 ng/mL: Systemic infection (sepsis) is not likely. Local bacterial infection is possible. (NOTE)       Sepsis PCT Algorithm           Lower Respiratory Tract                                      Infection PCT Algorithm    ----------------------------      ----------------------------         PCT < 0.25 ng/mL                PCT < 0.10 ng/mL          Strongly encourage  Strongly discourage   discontinuation of antibiotics    initiation of antibiotics    ----------------------------     -----------------------------       PCT 0.25 - 0.50 ng/mL            PCT 0.10 - 0.25 ng/mL               OR       >80% decrease in PCT            Discourage initiation of                                            antibiotics      Encourage discontinuation           of antibiotics    ----------------------------     -----------------------------         PCT >= 0.50 ng/mL              PCT 0.26 - 0.50 ng/mL               AND        <80% decrease in PCT             Encourage initiation of                                             antibiotics       Encourage continuation           of antibiotics    ----------------------------     -----------------------------        PCT >= 0.50 ng/mL                  PCT > 0.50 ng/mL               AND         increase in PCT                  Strongly encourage                                      initiation of antibiotics    Strongly encourage escalation           of antibiotics                                     -----------------------------                                           PCT <= 0.25 ng/mL                                                 OR                                        >  80% decrease in PCT                                      Discontinue / Do not initiate                                             antibiotics  Performed at [facility], [address] [place], [place] [address]   Blood culture (single)     Status: None   Collection Time: 07/23/23 10:42 AM   Specimen: BLOOD  Result Value Ref Range   Specimen Description BLOOD RIGHT ANTECUBITAL    Special Requests      BOTTLES DRAWN AEROBIC AND ANAEROBIC Blood Culture adequate volume   Culture      NO GROWTH 5  DAYS Performed at [facility], [address], [place], [place] [address]    Report Status 07/28/2023 FINAL   Troponin I (High Sensitivity)     Status: Abnormal   Collection Time: 07/23/23 11:29 AM  Result Value Ref Range   Troponin I (High Sensitivity) 20 (H) <18 ng/L    Comment: (NOTE) Elevated high sensitivity troponin I (hsTnI) values and significant  changes across serial measurements may suggest ACS but many other  chronic and acute conditions are known to elevate hsTnI results.  Refer to the "Links" section for chest pain algorithms and additional  guidance. Performed at [facility], [address] [place], [place] [address]   Lactic acid, plasma     Status: None   Collection Time: 07/23/23 12:22 PM  Result Value Ref Range   Lactic Acid, Venous 1.0 0.5 - 1.9 mmol/L    Comment: Performed at [facility], [address] [place], [place] [address]  Protime-INR     Status: None   Collection Time: 07/23/23 12:22 PM  Result Value Ref Range   Prothrombin Time 13.5 11.4 - 15.2 seconds   INR 1.0 0.8 - 1.2    Comment: (NOTE) INR goal varies based on device and disease states. Performed at [facility], [address] [place], [place] [address]   APTT     Status: None   Collection Time: 07/23/23 12:22 PM  Result Value Ref Range   aPTT 32 24 - 36 seconds    Comment: Performed at [facility], [address] [place], [place] [address]  ECHOCARDIOGRAM COMPLETE     Status: None   Collection Time: 07/23/23  2:20 PM  Result Value Ref Range   Weight 2,320 oz   Height 62 in   BP 141/89 mmHg   Ao pk vel 2.01 m/s   AV Area VTI 1.67 cm2   AR max vel 1.94 cm2   AV Mean grad 9.0 mmHg   AV Peak grad 16.1 mmHg   S' Lateral 2.30 cm   AV Area mean vel 1.49 cm2   Area-P 1/2 5.20 cm2   MV VTI 2.29 cm2   Est EF 60 - 65%   Heparin level (unfractionated)     Status: Abnormal   Collection Time: 07/23/23  8:16 PM  Result Value Ref  Range   Heparin Unfractionated 0.86 (H) 0.30 - 0.70 IU/mL    Comment: (NOTE) The clinical reportable range upper limit is being lowered to >1.10 to align with  the FDA approved guidance for the current laboratory assay.  If heparin results are below expected values, and patient dosage has  been confirmed, suggest follow up testing of antithrombin III levels. Performed at [facility], [address] [place], [place] [address]   CBC     Status: Abnormal   Collection Time: 07/24/23  4:41 AM  Result Value Ref Range   WBC 9.4 4.0 - 10.5 K/uL   RBC 3.68 (L) 3.87 - 5.11 MIL/uL   Hemoglobin 10.9 (L) 12.0 - 15.0 g/dL   HCT 65.7 (L) 84.6 - 96.2 %   MCV 86.4 80.0 - 100.0 fL   MCH 29.6 26.0 - 34.0 pg   MCHC 34.3 30.0 - 36.0 g/dL   RDW 95.2 84.1 - 32.4 %   Platelets 302 150 - 400 K/uL   nRBC 0.0 0.0 - 0.2 %    Comment: Performed at [facility], [address] [place], [place] [address]  Heparin level (unfractionated)     Status: None   Collection Time: 07/24/23  5:55 AM  Result Value Ref Range   Heparin Unfractionated 0.68 0.30 - 0.70 IU/mL    Comment: (NOTE) The clinical reportable range upper limit is being lowered to >1.10 to align with the FDA approved guidance for the current laboratory assay.  If heparin results are below expected values, and patient dosage has  been confirmed, suggest follow up testing of antithrombin III levels. Performed at [facility], [address], [place], [place] [address]       Assessment & Plan:  Patient advised to continue current medications.  Add Lasix for a few days.  Patient advised to wear compression stockings. 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   Total time spent: 30 minutes  Margaretann Loveless, MD  08/06/2023   This document may have been prepared by [facility] Voice Recognition software and as such may include unintentional dictation errors.

## 2023-08-13 ENCOUNTER — Other Ambulatory Visit: Payer: Self-pay | Admitting: Internal Medicine

## 2023-08-14 ENCOUNTER — Ambulatory Visit (INDEPENDENT_AMBULATORY_CARE_PROVIDER_SITE_OTHER): Payer: Medicare Other | Admitting: Vascular Surgery

## 2023-08-14 ENCOUNTER — Encounter (INDEPENDENT_AMBULATORY_CARE_PROVIDER_SITE_OTHER): Payer: Self-pay | Admitting: Vascular Surgery

## 2023-08-14 VITALS — BP 133/81 | HR 68 | Resp 16 | Wt 140.0 lb

## 2023-08-14 DIAGNOSIS — I2699 Other pulmonary embolism without acute cor pulmonale: Secondary | ICD-10-CM

## 2023-08-14 DIAGNOSIS — I82412 Acute embolism and thrombosis of left femoral vein: Secondary | ICD-10-CM

## 2023-08-14 DIAGNOSIS — I10 Essential (primary) hypertension: Secondary | ICD-10-CM

## 2023-08-14 NOTE — Assessment & Plan Note (Addendum)
 Doing well with anticoagulation.  No signs of bleeding.  Her primary care physician has prescribed her Eliquis.  I am happy to see her back if any assistance is needed with her anticoagulation regimen.  Otherwise I will see her as needed.  I would recommend 1 year of full anticoagulation after significant DVT and PE.

## 2023-08-14 NOTE — Assessment & Plan Note (Signed)
 blood pressure control important in reducing the progression of atherosclerotic disease. On appropriate oral medications.

## 2023-08-14 NOTE — Assessment & Plan Note (Signed)
 Doing well with anticoagulation.  No signs of bleeding.  Her primary care physician has prescribed her Eliquis.  I am happy to see her back if any assistance is needed with her anticoagulation regimen.  Otherwise I will see her as needed.

## 2023-08-14 NOTE — Progress Notes (Signed)
 MRN : [id number]  Patricia Knox is a 88 y.o. (10-Dec-1935) female who presents with chief complaint of  Chief Complaint  Patient presents with   Follow-up    3 week follow up  .  History of Present Illness: Patient returns today in follow up of her DVT and PE.  She is doing well.  She denies any significant leg pain.  She has had very mild swelling.  No significant chest pain or shortness of breath.  She has tolerated Eliquis without any signs of bleeding.  Her primary care physician has prescribed her Eliquis since hospital discharge.  Current Outpatient Medications  Medication Sig Dispense Refill   abobotulinumtoxinA (DYSPORT) 300 units SOLR injection Inject 300 Units into the muscle every 3 (three) months. 1 each 3   amLODipine (NORVASC) 5 MG tablet Take 1 tablet by mouth once daily 90 tablet 3   apixaban (ELIQUIS) 5 MG TABS tablet Take 2 tablets (10 mg total) by mouth 2 (two) times daily for 7 days, THEN 1 tablet (5 mg total) 2 (two) times daily. 60 tablet 3   aspirin EC 81 MG tablet Take 1 tablet (81 mg total) by mouth daily. Swallow whole. 30 tablet 12   atorvastatin (LIPITOR) 40 MG tablet TAKE 1 TABLET BY MOUTH ONCE DAILY AT BEDTIME 90 tablet 3   Calcium Carbonate-Vitamin D (CALTRATE 600+D PO) Take 1 capsule by mouth 2 (two) times daily.     furosemide (LASIX) 20 MG tablet Take 1 tablet (20 mg total) by mouth daily. 30 tablet 11   gabapentin (NEURONTIN) 100 MG capsule TAKE 3 CAPSULES BY MOUTH AT BEDTIME 270 capsule 0   nitrofurantoin, macrocrystal-monohydrate, (MACROBID) 100 MG capsule Take 100 mg by mouth 2 (two) times daily.     omeprazole (PRILOSEC) 20 MG capsule Take 1 capsule by mouth twice daily 180 capsule 0   rOPINIRole (REQUIP) 1 MG tablet Take 1 tablet by mouth twice daily 180 tablet 0   spironolactone (ALDACTONE) 25 MG tablet Take 1 tablet by mouth once daily 90 tablet 3   traZODone (DESYREL) 100 MG tablet TAKE 1 TABLET BY MOUTH NIGHTLY AT BEDTIME AS NEEDED FOR INSOMNIA  90 tablet 0   alendronate (FOSAMAX) 70 MG tablet Take 1 tablet by mouth once a week 12 tablet 3   No current facility-administered medications for this visit.    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   COLONOSCOPY  2002   TOE SURGERY  1999     Social History  Tobacco Use   Smoking status: Never   Smokeless tobacco: Never  Substance Use Topics   Alcohol use: No   Drug use: No       Family History  Problem Relation Age of Onset   Stroke Mother    Heart attack Father    Breast cancer Neg Hx      No Known Allergies   REVIEW OF SYSTEMS (Negative unless checked)  Constitutional: [] Weight loss  [] Fever  [] Chills Cardiac: [] Chest pain   [] Chest pressure   [] Palpitations   [] Shortness of breath when laying flat   [] Shortness of breath at rest   [] Shortness of breath with exertion. Vascular:  [] Pain in legs with walking   [] Pain in legs at rest   [] Pain in legs when laying flat   [] Claudication   [] Pain in feet when walking  [] Pain in feet at rest  [] Pain in feet when laying flat   [x] History of DVT    [x] Phlebitis   [] Swelling in legs   [] Varicose veins   [] Non-healing ulcers Pulmonary:   [] Uses home oxygen   [] Productive cough   [] Hemoptysis   [] Wheeze  [] COPD   [] Asthma Neurologic:  [] Dizziness  [] Blackouts   [] Seizures   [] History of stroke   [] History of TIA  [] Aphasia   [] Temporary blindness   [] Dysphagia   [] Weakness or numbness in arms   [] Weakness or numbness in legs Musculoskeletal:  [x] Arthritis   [] Joint swelling   [] Joint pain   [] Low back pain Hematologic:  [] Easy bruising  [] Easy bleeding   [] Hypercoagulable state   [] Anemic   Gastrointestinal:  [] Blood in stool   [] Vomiting blood  [x] Gastroesophageal reflux/heartburn   [] Abdominal pain Genitourinary:  [] Chronic kidney disease   [] Difficult urination  [] Frequent urination  [] Burning with urination   [] Hematuria Skin:  [] Rashes   [] Ulcers   [] Wounds Psychological:  [] History of anxiety   []  History of major depression.  Physical Examination  BP 133/81   Pulse 68   Resp 16   Wt 140 lb (63.5 kg)   BMI 25.61 kg/m  Gen:  WD/WN, NAD. Appears younger than stated age. Head: [place]/AT, No temporalis wasting. Left sided facial droop Ear/Nose/Throat: Hearing grossly intact, nares w/o erythema or drainage Eyes: Conjunctiva clear. Sclera non-icteric Neck: Supple.  Trachea midline Pulmonary:  Good air movement, no use of accessory muscles.  Cardiac: RRR, no JVD Vascular:  Vessel Right Left  Radial Palpable Palpable           Musculoskeletal: M/S 5/5 throughout.  No deformity or atrophy.  No appreciable lower extremity edema. Neurologic: Sensation grossly intact in extremities.  Symmetrical.  Speech is fluent.  Psychiatric: Judgment intact, Mood & affect appropriate for pt's clinical situation. Dermatologic: No rashes or ulcers noted.  No cellulitis or open wounds.      Labs Recent Results (from the past 2160 hours)  Basic metabolic panel     Status: Abnormal   Collection Time: 07/23/23  9:36 AM  Result Value Ref Range    Sodium 139 135 - 145 mmol/L   Potassium 3.5 3.5 - 5.1 mmol/L   Chloride 102 98 - 111 mmol/L   CO2 24 22 - 32 mmol/L   Glucose, Bld 101 (H) 70 - 99 mg/dL    Comment: Glucose reference range applies only to samples taken after fasting for at least 8 hours.   BUN 12 8 - 23 mg/dL   Creatinine, Ser 1.61 (H) 0.44 - 1.00 mg/dL   Calcium 9.0 8.9 -  10.3 mg/dL   GFR, Estimated 55 (L) >60 mL/min    Comment: (NOTE) Calculated using the CKD-EPI Creatinine Equation (2021)    Anion gap 13 5 - 15    Comment: Performed at [facility], [address] [place], [place] [address]  CBC     Status: Abnormal   Collection Time: 07/23/23  9:36 AM  Result Value Ref Range   WBC 10.7 (H) 4.0 - 10.5 K/uL   RBC 4.17 3.87 - 5.11 MIL/uL   Hemoglobin 12.3 12.0 - 15.0 g/dL   HCT 02.5 42.7 - 06.2 %   MCV 89.0 80.0 - 100.0 fL   MCH 29.5 26.0 - 34.0 pg   MCHC 33.2 30.0 - 36.0 g/dL   RDW 37.6 28.3 - 15.1 %   Platelets 278 150 - 400 K/uL   nRBC 0.0 0.0 - 0.2 %    Comment: Performed at [facility], [address] [place], [place] [address]  Troponin I (High Sensitivity)     Status: Abnormal   Collection Time: 07/23/23  9:36 AM  Result Value Ref Range   Troponin I (High Sensitivity) 24 (H) <18 ng/L    Comment: (NOTE) Elevated high sensitivity troponin I (hsTnI) values and significant  changes across serial measurements may suggest ACS but many other  chronic and acute conditions are known to elevate hsTnI results.  Refer to the "Links" section for chest pain algorithms and additional  guidance. Performed at [facility], [address] [place], [place] [address]   Hepatic function panel     Status: Abnormal   Collection Time: 07/23/23  9:36 AM  Result Value Ref Range   Total Protein 7.3 6.5 - 8.1 g/dL   Albumin 3.2 (L) 3.5 - 5.0 g/dL   AST 27 15 - 41 U/L   ALT 19 0 - 44 U/L   Alkaline Phosphatase 79 38 - 126 U/L   Total Bilirubin 1.1 0.0 - 1.2 mg/dL   Bilirubin, Direct 0.2  0.0 - 0.2 mg/dL   Indirect Bilirubin 0.9 0.3 - 0.9 mg/dL    Comment: Performed at [facility], [address] [place], [place] [address]  Urinalysis, Routine w reflex microscopic -Urine, Clean Catch     Status: Abnormal   Collection Time: 07/23/23 10:25 AM  Result Value Ref Range   Color, Urine YELLOW (A) YELLOW   APPearance HAZY (A) CLEAR   Specific Gravity, Urine 1.011 1.005 - 1.030   pH 7.0 5.0 - 8.0   Glucose, UA NEGATIVE NEGATIVE mg/dL   Hgb urine dipstick NEGATIVE NEGATIVE   Bilirubin Urine NEGATIVE NEGATIVE   Ketones, ur 5 (A) NEGATIVE mg/dL   Protein, ur NEGATIVE NEGATIVE mg/dL   Nitrite NEGATIVE NEGATIVE   Leukocytes,Ua LARGE (A) NEGATIVE   RBC / HPF 0-5 0 - 5 RBC/hpf   WBC, UA 21-50 0 - 5 WBC/hpf   Bacteria, UA RARE (A) NONE SEEN   Squamous Epithelial / HPF 0-5 0 - 5 /HPF    Comment: Performed at [facility], [address], [place], [place] [address]  Urine Culture     Status: Abnormal   Collection Time: 07/23/23 10:25 AM   Specimen: Urine, Clean Catch  Result Value Ref Range   Specimen Description      URINE, CLEAN CATCH Performed at [facility], [address] [place], [place] [address]    Special Requests      NONE Performed at [facility], [address] [place],  [place] [address]    Culture MULTIPLE SPECIES PRESENT, SUGGEST RECOLLECTION (A)    Report Status 07/24/2023 FINAL   Lactic acid, plasma     Status: None   Collection Time: 07/23/23 10:42 AM  Result Value Ref Range   Lactic Acid, Venous 1.5 0.5 - 1.9 mmol/L    Comment: Performed at [facility], [address] [place], [place] [address]  Procalcitonin     Status: None   Collection Time: 07/23/23 10:42 AM  Result Value Ref Range   Procalcitonin <0.10 ng/mL    Comment:        Interpretation: PCT (Procalcitonin) <= 0.5 ng/mL: Systemic infection (sepsis) is not likely. Local bacterial infection is possible. (NOTE)       Sepsis PCT Algorithm            Lower Respiratory Tract                                      Infection PCT Algorithm    ----------------------------     ----------------------------         PCT < 0.25 ng/mL                PCT < 0.10 ng/mL          Strongly encourage             Strongly discourage   discontinuation of antibiotics    initiation of antibiotics    ----------------------------     -----------------------------       PCT 0.25 - 0.50 ng/mL            PCT 0.10 - 0.25 ng/mL               OR       >80% decrease in PCT            Discourage initiation of                                            antibiotics      Encourage discontinuation           of antibiotics    ----------------------------     -----------------------------         PCT >= 0.50 ng/mL              PCT 0.26 - 0.50 ng/mL               AND        <80% decrease in PCT             Encourage initiation of                                             antibiotics       Encourage continuation           of antibiotics    ----------------------------     -----------------------------        PCT >= 0.50 ng/mL                  PCT > 0.50 ng/mL               AND  increase in PCT                  Strongly encourage                                      initiation of antibiotics    Strongly encourage escalation           of antibiotics                                     -----------------------------                                           PCT <= 0.25 ng/mL                                                 OR                                        > 80% decrease in PCT                                      Discontinue / Do not initiate                                             antibiotics  Performed at [facility], [address] [place], [place] [address]   Blood culture (single)     Status: None   Collection Time: 07/23/23 10:42 AM   Specimen: BLOOD  Result Value Ref Range   Specimen Description BLOOD RIGHT  ANTECUBITAL    Special Requests      BOTTLES DRAWN AEROBIC AND ANAEROBIC Blood Culture adequate volume   Culture      NO GROWTH 5 DAYS Performed at [facility], [address], [place], [place] [address]    Report Status 07/28/2023 FINAL   Troponin I (High Sensitivity)     Status: Abnormal   Collection Time: 07/23/23 11:29 AM  Result Value Ref Range   Troponin I (High Sensitivity) 20 (H) <18 ng/L    Comment: (NOTE) Elevated high sensitivity troponin I (hsTnI) values and significant  changes across serial measurements may suggest ACS but many other  chronic and acute conditions are known to elevate hsTnI results.  Refer to the "Links" section for chest pain algorithms and additional  guidance. Performed at [facility], [address] [place], [place] [address]   Lactic acid, plasma     Status: None   Collection Time: 07/23/23 12:22 PM  Result Value Ref Range   Lactic Acid, Venous 1.0 0.5 - 1.9 mmol/L    Comment: Performed at [facility], [address] [place], [place] [address]  Protime-INR     Status: None   Collection Time: 07/23/23 12:22  PM  Result Value Ref Range   Prothrombin Time 13.5 11.4 - 15.2 seconds   INR 1.0 0.8 - 1.2    Comment: (NOTE) INR goal varies based on device and disease states. Performed at [facility], [address] [place], [place] [address]   APTT     Status: None   Collection Time: 07/23/23 12:22 PM  Result Value Ref Range   aPTT 32 24 - 36 seconds    Comment: Performed at [facility], [address] [place], [place] [address]  ECHOCARDIOGRAM COMPLETE     Status: None   Collection Time: 07/23/23  2:20 PM  Result Value Ref Range   Weight 2,320 oz   Height 62 in   BP 141/89 mmHg   Ao pk vel 2.01 m/s   AV Area VTI 1.67 cm2   AR max vel 1.94 cm2   AV Mean grad 9.0 mmHg   AV Peak grad 16.1 mmHg   S' Lateral 2.30 cm   AV Area mean vel 1.49 cm2   Area-P 1/2 5.20 cm2   MV VTI 2.29 cm2    Est EF 60 - 65%   Heparin level (unfractionated)     Status: Abnormal   Collection Time: 07/23/23  8:16 PM  Result Value Ref Range   Heparin Unfractionated 0.86 (H) 0.30 - 0.70 IU/mL    Comment: (NOTE) The clinical reportable range upper limit is being lowered to >1.10 to align with the FDA approved guidance for the current laboratory assay.  If heparin results are below expected values, and patient dosage has  been confirmed, suggest follow up testing of antithrombin III levels. Performed at [facility], [address] [place], [place] [address]   CBC     Status: Abnormal   Collection Time: 07/24/23  4:41 AM  Result Value Ref Range   WBC 9.4 4.0 - 10.5 K/uL   RBC 3.68 (L) 3.87 - 5.11 MIL/uL   Hemoglobin 10.9 (L) 12.0 - 15.0 g/dL   HCT 91.4 (L) 78.2 - 95.6 %   MCV 86.4 80.0 - 100.0 fL   MCH 29.6 26.0 - 34.0 pg   MCHC 34.3 30.0 - 36.0 g/dL   RDW 21.3 08.6 - 57.8 %   Platelets 302 150 - 400 K/uL   nRBC 0.0 0.0 - 0.2 %    Comment: Performed at [facility], [address] [place], [place] [address]  Heparin level (unfractionated)     Status: None   Collection Time: 07/24/23  5:55 AM  Result Value Ref Range   Heparin Unfractionated 0.68 0.30 - 0.70 IU/mL    Comment: (NOTE) The clinical reportable range upper limit is being lowered to >1.10 to align with the FDA approved guidance for the current laboratory assay.  If heparin results are below expected values, and patient dosage has  been confirmed, suggest follow up testing of antithrombin III levels. Performed at [facility], [address], [place], [place] [address]     Radiology ECHOCARDIOGRAM COMPLETE Result Date: 07/23/2023    ECHOCARDIOGRAM REPORT   Patient Name:   Patricia Knox Date of Exam: 07/23/2023 Medical Rec #:  [id number]     Height:       62.0 in Accession #:    [id number]    Weight:       145.0 lb Date of Birth:  26-Jun-1935    BSA:          1.667 m Patient Age:  87 years       BP:           141/89 mmHg Patient Gender: F             HR:           77 bpm. Exam Location:  [facility] Procedure: 2D Echo, Cardiac Doppler, Color Doppler and Strain Analysis (Both            Spectral and Color Flow Doppler were utilized during procedure). Indications:     Pulmonary embolus  History:         Patient has no prior history of Echocardiogram examinations.                  Risk Factors:Hypertension and Dyslipidemia. Pulmonary emboli.  Sonographer:     Mikki Harbor Referring Phys:  [id number] Emeline General Diagnosing Phys: Windell Norfolk  Sonographer Comments: Global longitudinal strain was attempted. IMPRESSIONS  1. Left ventricular ejection fraction, by estimation, is 60 to 65%. The left ventricle has normal function. The left ventricle has no regional wall motion abnormalities. Left ventricular diastolic parameters are consistent with Grade I diastolic dysfunction (impaired relaxation).  2. Right ventricular systolic function is normal. The right ventricular size is mildly enlarged. There is mildly elevated pulmonary artery systolic pressure.  3. The mitral valve is normal in structure. Trivial mitral valve regurgitation.  4. The aortic valve is tricuspid. Aortic valve regurgitation is not visualized. Aortic valve sclerosis is present, with no evidence of aortic valve stenosis.  5. Aortic dilatation noted. There is mild dilatation of the ascending aorta, measuring 40 mm. FINDINGS  Left Ventricle: Left ventricular ejection fraction, by estimation, is 60 to 65%. The left ventricle has normal function. The left ventricle has no regional wall motion abnormalities. The left ventricular internal cavity size was normal in size. There is  no left ventricular hypertrophy. Left ventricular diastolic parameters are consistent with Grade I diastolic dysfunction (impaired relaxation). Right Ventricle: The right ventricular size is mildly enlarged. No increase in right ventricular wall thickness. Right ventricular  systolic function is normal. There is mildly elevated pulmonary artery systolic pressure. The tricuspid regurgitant velocity is 2.81 m/s, and with an assumed right atrial pressure of 5 mmHg, the estimated right ventricular systolic pressure is 36.6 mmHg. Left Atrium: Left atrial size was normal in size. Right Atrium: Right atrial size was normal in size. Pericardium: There is no evidence of pericardial effusion. Mitral Valve: The mitral valve is normal in structure. Trivial mitral valve regurgitation. MV peak gradient, 9.5 mmHg. The mean mitral valve gradient is 4.0 mmHg. Tricuspid Valve: The tricuspid valve is normal in structure. Tricuspid valve regurgitation is mild. Aortic Valve: The aortic valve is tricuspid. Aortic valve regurgitation is not visualized. Aortic valve sclerosis is present, with no evidence of aortic valve stenosis. Aortic valve mean gradient measures 9.0 mmHg. Aortic valve peak gradient measures 16.1 mmHg. Aortic valve area, by VTI measures 1.67 cm. Pulmonic Valve: The pulmonic valve was not well visualized. Pulmonic valve regurgitation is trivial. Aorta: The aortic root is normal in size and structure and aortic dilatation noted. There is mild dilatation of the ascending aorta, measuring 40 mm. IAS/Shunts: The atrial septum is grossly normal. Additional Comments: 3D imaging was not performed.  LEFT VENTRICLE PLAX 2D LVIDd:         3.80 cm   Diastology LVIDs:         2.30 cm   LV e' medial:    7.51 cm/s LV  PW:         1.00 cm   LV E/e' medial:  16.8 LV IVS:        0.90 cm   LV e' lateral:   12.20 cm/s LVOT diam:     1.90 cm   LV E/e' lateral: 10.3 LV SV:         79 LV SV Index:   47 LVOT Area:     2.84 cm  RIGHT VENTRICLE RV Basal diam:  3.65 cm RV Mid diam:    3.40 cm RV S prime:     15.90 cm/s TAPSE (M-mode): 2.6 cm LEFT ATRIUM             Index        RIGHT ATRIUM           Index LA diam:        3.40 cm 2.04 cm/m   RA Area:     15.00 cm LA Vol (A2C):   40.1 ml 24.05 ml/m  RA Volume:    33.80 ml  20.27 ml/m LA Vol (A4C):   56.4 ml 33.82 ml/m LA Biplane Vol: 48.0 ml 28.79 ml/m  AORTIC VALVE                     PULMONIC VALVE AV Area (Vmax):    1.94 cm      PV Vmax:       0.99 m/s AV Area (Vmean):   1.49 cm      PV Peak grad:  3.9 mmHg AV Area (VTI):     1.67 cm AV Vmax:           200.50 cm/s AV Vmean:          142.000 cm/s AV VTI:            0.474 m AV Peak Grad:      16.1 mmHg AV Mean Grad:      9.0 mmHg LVOT Vmax:         137.00 cm/s LVOT Vmean:        74.400 cm/s LVOT VTI:          0.279 m LVOT/AV VTI ratio: 0.59  AORTA Ao Root diam: 3.30 cm Ao Asc diam:  4.00 cm MITRAL VALVE                TRICUSPID VALVE MV Area (PHT): 5.20 cm     TR Peak grad:   31.6 mmHg MV Area VTI:   2.29 cm     TR Vmax:        281.00 cm/s MV Peak grad:  9.5 mmHg MV Mean grad:  4.0 mmHg     SHUNTS MV Vmax:       1.54 m/s     Systemic VTI:  0.28 m MV Vmean:      84.3 cm/s    Systemic Diam: 1.90 cm MV Decel Time: 146 msec MV E velocity: 126.00 cm/s MV A velocity: 134.00 cm/s MV E/A ratio:  0.94 Windell Norfolk Electronically signed by Windell Norfolk Signature Date/Time: 07/23/2023/4:58:04 PM    Final    US Venous Img Lower Bilateral (DVT) Result Date: 07/23/2023 CLINICAL DATA:  Pulmonary embolism.  Lower extremity edema. EXAM: BILATERAL LOWER EXTREMITY VENOUS DOPPLER ULTRASOUND TECHNIQUE: Gray-scale sonography with graded compression, as well as color Doppler and duplex ultrasound were performed to evaluate the lower extremity deep venous systems from the level of the common femoral vein and including the common femoral,  femoral, profunda femoral, popliteal and calf veins including the posterior tibial, peroneal and gastrocnemius veins when visible. The superficial great saphenous vein was also interrogated. Spectral Doppler was utilized to evaluate flow at rest and with distal augmentation maneuvers in the common femoral, femoral and popliteal veins. COMPARISON:  None Available. FINDINGS: RIGHT LOWER EXTREMITY  Common Femoral Vein: No evidence of thrombus. Normal compressibility, respiratory phasicity and response to augmentation. Saphenofemoral Junction: No evidence of thrombus. Normal compressibility and flow on color Doppler imaging. Profunda Femoral Vein: No evidence of thrombus. Normal compressibility and flow on color Doppler imaging. Femoral Vein: No evidence of thrombus. Normal compressibility, respiratory phasicity and response to augmentation. Popliteal Vein: No evidence of thrombus. Normal compressibility, respiratory phasicity and response to augmentation. Calf Veins: No evidence of thrombus. Normal compressibility and flow on color Doppler imaging. Superficial Great Saphenous Vein: No evidence of thrombus. Normal compressibility. Venous Reflux:  None. Other Findings: No evidence of superficial thrombophlebitis or abnormal fluid collection. LEFT LOWER EXTREMITY Common Femoral Vein: Nonocclusive thrombus near the saphenofemoral junction. Saphenofemoral Junction: Thrombus extending into the thigh segment of the great saphenous vein. Profunda Femoral Vein: No evidence of thrombus. Normal compressibility and flow on color Doppler imaging. Femoral Vein: No evidence of thrombus. Normal compressibility, respiratory phasicity and response to augmentation. Popliteal Vein: Nonocclusive thrombus in the left popliteal vein. Calf Veins: No evidence of thrombus. Normal compressibility and flow on color Doppler imaging. Superficial Great Saphenous Vein: No evidence of thrombus. Normal compressibility. Venous Reflux:  None. Other Findings: Left popliteal fossa Baker's cyst measures approximately 5.2 x 1.3 x 2.7 cm. IMPRESSION: 1. Nonocclusive thrombus in the left common femoral vein near the saphenofemoral junction. Thrombus extends into the thigh segment of the left great saphenous vein. 2. Nonocclusive thrombus in the left popliteal vein. 3. No evidence of right lower extremity deep venous thrombosis. 4. Left popliteal fossa  Baker's cyst measuring approximately 5.2 x 1.3 x 2.7 cm. Electronically Signed   By: Irish Lack M.D.   On: 07/23/2023 15:02   CT Angio Chest PE W and/or Wo Contrast Addendum Date: 07/23/2023 ADDENDUM REPORT: 07/23/2023 11:53 ADDENDUM: Study discussed by telephone with Dr. Delton Prairie on 07/23/2023 at 1139 hours. Electronically Signed   By: Odessa Fleming M.D.   On: 07/23/2023 11:53   Result Date: 07/23/2023 CLINICAL DATA:  88 year old female with chest pain. EXAM: CT ANGIOGRAPHY CHEST WITH CONTRAST TECHNIQUE: Multidetector CT imaging of the chest was performed using the standard protocol during bolus administration of intravenous contrast. Multiplanar CT image reconstructions and MIPs were obtained to evaluate the vascular anatomy. RADIATION DOSE REDUCTION: This exam was performed according to the departmental dose-optimization program which includes automated exposure control, adjustment of the mA and/or kV according to patient size and/or use of iterative reconstruction technique. CONTRAST:  75mL OMNIPAQUE IOHEXOL 350 MG/ML SOLN COMPARISON:  Chest radiographs 0941 hours today. FINDINGS: Cardiovascular: Good contrast bolus timing in the pulmonary arterial tree. Bilateral pulmonary emboli. No saddle embolus. No lobar thrombosis. But widespread segmental pulmonary clot in the upper lobes, right middle lobe. Superimposed tortuous aorta with advanced calcified atherosclerosis. Calcified coronary artery atherosclerosis. No pericardial effusion. Mediastinum/Nodes: Large gastric hiatal hernia with intrathoracic stomach and mesentery. No superimposed mediastinal mass or lymphadenopathy. Lungs/Pleura: Major airways are patent. Subtotal right lower lobe atelectasis is due to combined compression from intrathoracic stomach, moderate layering right pleural effusion with simple fluid density. There is confluent and peripheral ground-glass opacity in the right upper lobe suspicious for developing pulmonary infarct. And there  is mosaic attenuation elsewhere in both upper lobes. Right middle lobe lateral segment enhancing atelectasis. No left pleural effusion. Negative left lower lobe. Upper Abdomen: Fluid density circumscribed area in the anterior liver on series 4, image 131 is favored to be benign cyst (no follow-up imaging recommended). Negative visible spleen, pancreas, adrenal glands and kidneys. Intrathoracic stomach, negative visible other bowel in the upper abdomen. No free air or free fluid. Musculoskeletal: Advanced thoracic spine degeneration superimposed on some levels of interbody ankylosis. Pronounced vacuum disc at T11-T12. No acute or suspicious osseous lesion is identified. Review of the MIP images confirms the above findings. IMPRESSION: 1. Positive for bilateral segmental pulmonary emboli. No saddle embolus or lobar thrombosis. Evidence of right upper lobe developing pulmonary infarct. Small to moderate layering right pleural effusion appears to be transudate. 2. Superimpose intrathoracic mass effect from very Large hiatal hernia with intrathoracic stomach and mesentery. Right lower and middle lobe atelectasis. 3. Aortic Atherosclerosis (ICD10-I70.0). Coronary artery atherosclerosis. Electronically Signed: By: Odessa Fleming M.D. On: 07/23/2023 11:35   DG Chest 2 View Result Date: 07/23/2023 CLINICAL DATA:  Chest pain. EXAM: CHEST - 2 VIEW COMPARISON:  None Available. FINDINGS: Mild cardiomegaly is noted. Left lung is clear. Small right pleural effusion is noted with associated right basilar atelectasis or infiltrate. Bony thorax is unremarkable. IMPRESSION: Small right pleural effusion with adjacent right basilar atelectasis or infiltrate. Electronically Signed   By: Lupita Raider M.D.   On: 07/23/2023 10:20    Assessment/Plan  Acute pulmonary embolism [facility]) Doing well with anticoagulation.  No signs of bleeding.  Her primary care physician has prescribed her Eliquis.  I am happy to see her back if any assistance  is needed with her anticoagulation regimen.  Otherwise I will see her as needed.  I would recommend 1 year of full anticoagulation after significant DVT and PE.  Acute deep vein thrombosis (DVT) of femoral vein of left lower extremity (HCC) Doing well with anticoagulation.  No signs of bleeding.  Her primary care physician has prescribed her Eliquis.  I am happy to see her back if any assistance is needed with her anticoagulation regimen.  Otherwise I will see her as needed.  Essential hypertension, benign blood pressure control important in reducing the progression of atherosclerotic disease. On appropriate oral medications.    Festus Barren, MD  08/14/2023 12:12 PM    This note was created with Dragon medical transcription system.  Any errors from dictation are purely unintentional

## 2023-08-17 ENCOUNTER — Ambulatory Visit: Admitting: Internal Medicine

## 2023-08-17 ENCOUNTER — Other Ambulatory Visit: Payer: Self-pay

## 2023-08-18 ENCOUNTER — Other Ambulatory Visit: Payer: Self-pay

## 2023-08-20 ENCOUNTER — Encounter: Payer: Self-pay | Admitting: Internal Medicine

## 2023-08-20 ENCOUNTER — Ambulatory Visit (INDEPENDENT_AMBULATORY_CARE_PROVIDER_SITE_OTHER): Admitting: Internal Medicine

## 2023-08-20 VITALS — BP 122/68 | HR 50 | Ht 62.0 in | Wt 141.2 lb

## 2023-08-20 DIAGNOSIS — I10 Essential (primary) hypertension: Secondary | ICD-10-CM | POA: Diagnosis not present

## 2023-08-20 DIAGNOSIS — I2694 Multiple subsegmental pulmonary emboli without acute cor pulmonale: Secondary | ICD-10-CM | POA: Diagnosis not present

## 2023-08-20 DIAGNOSIS — I82412 Acute embolism and thrombosis of left femoral vein: Secondary | ICD-10-CM

## 2023-08-20 DIAGNOSIS — R7303 Prediabetes: Secondary | ICD-10-CM

## 2023-08-20 DIAGNOSIS — G5132 Clonic hemifacial spasm, left: Secondary | ICD-10-CM | POA: Diagnosis not present

## 2023-08-20 DIAGNOSIS — E782 Mixed hyperlipidemia: Secondary | ICD-10-CM | POA: Diagnosis not present

## 2023-08-20 MED ORDER — APIXABAN 5 MG PO TABS
ORAL_TABLET | ORAL | 6 refills | Status: DC
Start: 2023-08-20 — End: 2024-04-22

## 2023-08-20 NOTE — Progress Notes (Signed)
 Established Patient Office Visit  Subjective:  Patient ID: Patricia Knox, female    DOB: 03/11/1936  Age: 88 y.o. MRN: [id number]  Chief Complaint  Patient presents with   Follow-up    10 day follow up    Patient comes in for follow-up of her DVT / PE and bilateral ankle swelling.  She is currently on Eliquis 5 mg twice a day.  Reports that the medicine is very expensive at [facility] pharmacy and would like it to be sent to [facility].  Meanwhile she has been given samples.  Vascular surgery has recommended for 1 year of therapy with Eliquis. No complaints of chest chest pain or shortness of breath. Patient was also given a prescription for Lasix to be taken 1 a day for a week and then Monday Wednesdays and Fridays.  Her swelling has improved but today she notices an increase in swelling as she has been on her feet.  She does have compression stockings but she is not wearing it today.  The small ulcer on her leg has also healed nicely.    No other concerns at this time.   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    COLONOSCOPY  2002   TOE SURGERY  1999    Social History   Socioeconomic History   Marital status: Single    Spouse name: Not on file   Number of children: 3   Years of education: HS   Highest education level: Not on file  Occupational History   Occupation: Retired  Tobacco Use   Smoking status: Never   Smokeless tobacco: Never  Substance and Sexual Activity   Alcohol use: No   Drug use: No   Sexual activity: Not on file  Other Topics Concern   Not on file  Social History Narrative   Lives at home with husband.   Right-handed.   No daily caffeine use.   Social Drivers of Corporate investment banker Strain: Not on file  Food Insecurity: No Food Insecurity (07/23/2023)   Hunger Vital Sign    Worried About Running Out of Food in the Last Year: Never true    Ran Out of Food in the Last Year: Never true  Transportation Needs: No Transportation Needs (07/23/2023)   PRAPARE - Administrator, Civil Service (Medical): No    Lack of Transportation (Non-Medical): No  Physical Activity: Not on file  Stress: Not on file  Social Connections: Unknown (07/23/2023)   Social Connection and Isolation  Panel [NHANES]    Frequency of Communication with Friends and Family: More than three times a week    Frequency of Social Gatherings with Friends and Family: More than three times a week    Attends Religious Services: Patient declined    Active Member of Clubs or Organizations: Patient declined    Attends Banker Meetings: Patient declined    Marital Status: Widowed  Intimate Partner Violence: Not At Risk (07/23/2023)   Humiliation, Afraid, Rape, and Kick questionnaire    Fear of Current or Ex-Partner: No    Emotionally Abused: No    Physically Abused: No    Sexually Abused: No    Family History  Problem Relation Age of Onset   Stroke Mother    Heart attack Father    Breast cancer Neg Hx     No Known Allergies  Outpatient Medications Prior to Visit   Medication Sig   abobotulinumtoxinA (DYSPORT) 300 units SOLR injection Inject 300 Units into the muscle every 3 (three) months.   alendronate (FOSAMAX) 70 MG tablet Take 1 tablet by mouth once a week   amLODipine (NORVASC) 5 MG tablet Take 1 tablet by mouth once daily   apixaban (ELIQUIS) 5 MG TABS tablet Take 2 tablets (10 mg total) by mouth 2 (two) times daily for 7 days, THEN 1 tablet (5 mg total) 2 (two) times daily.   aspirin EC 81 MG tablet Take 1 tablet (81 mg total) by mouth daily. Swallow whole.   atorvastatin (LIPITOR) 40 MG tablet TAKE 1 TABLET BY MOUTH ONCE DAILY AT BEDTIME   Calcium Carbonate-Vitamin D (CALTRATE 600+D PO) Take 1 capsule by mouth 2 (two) times daily.   furosemide (LASIX) 20 MG tablet Take 1 tablet (20 mg total) by mouth daily.   gabapentin (NEURONTIN) 100 MG capsule TAKE 3 CAPSULES BY MOUTH AT BEDTIME   omeprazole (PRILOSEC) 20 MG capsule Take 1 capsule by mouth twice daily   rOPINIRole (REQUIP) 1 MG tablet Take 1 tablet by mouth twice daily   spironolactone (ALDACTONE) 25 MG tablet Take 1 tablet by mouth once daily   traZODone (DESYREL) 100 MG tablet TAKE 1 TABLET BY MOUTH NIGHTLY AT BEDTIME AS NEEDED FOR INSOMNIA   nitrofurantoin, macrocrystal-monohydrate, (MACROBID) 100 MG capsule Take 100 mg by mouth 2 (two) times daily. (Patient not taking: Reported on 08/20/2023)   No facility-administered medications prior to visit.    Review of Systems  Constitutional:  Negative for chills, fever, malaise/fatigue and weight loss.  HENT: Negative.    Eyes: Negative.   Respiratory: Negative.  Negative for cough and shortness of breath.   Cardiovascular: Negative.  Negative for chest pain, palpitations and leg swelling.  Gastrointestinal: Negative.  Negative for abdominal pain, constipation, diarrhea, heartburn, nausea and vomiting.  Genitourinary: Negative.  Negative for dysuria and flank pain.  Musculoskeletal: Negative.  Negative for joint pain and myalgias.  Skin:  Negative.   Neurological: Negative.  Negative for dizziness and headaches.  Endo/Heme/Allergies: Negative.   Psychiatric/Behavioral: Negative.  Negative for depression and suicidal ideas. The patient is not nervous/anxious.        Objective:   BP 122/68   Pulse (!) 50   Ht 5\' 2"  (1.575 m)   Wt 141 lb 3.2 oz (64 kg)   SpO2 96%   BMI 25.83 kg/m   Vitals:   08/20/23 1345  BP: 122/68  Pulse: (!) 50  Height: 5\' 2"  (1.575 m)  Weight: 141 lb 3.2 oz (64 kg)  SpO2:  96%  BMI (Calculated): 25.82    Physical Exam Vitals and nursing note reviewed.  Constitutional:      Appearance: Normal appearance.  HENT:     Head: Normocephalic and atraumatic.     Nose: Nose normal.     Mouth/Throat:     Mouth: Mucous membranes are moist.     Pharynx: Oropharynx is clear.  Eyes:     Conjunctiva/sclera: Conjunctivae normal.     Pupils: Pupils are equal, round, and reactive to light.  Cardiovascular:     Rate and Rhythm: Normal rate and regular rhythm.     Pulses: Normal pulses.     Heart sounds: Normal heart sounds. No murmur heard. Pulmonary:     Effort: Pulmonary effort is normal.     Breath sounds: Normal breath sounds. No wheezing.  Abdominal:     General: Bowel sounds are normal.     Palpations: Abdomen is soft.     Tenderness: There is no abdominal tenderness. There is no right CVA tenderness or left CVA tenderness.  Musculoskeletal:        General: Normal range of motion.     Cervical back: Normal range of motion.     Right lower leg: No edema.     Left lower leg: No edema.  Skin:    General: Skin is warm and dry.  Neurological:     General: No focal deficit present.     Mental Status: She is alert and oriented to person, place, and time.  Psychiatric:        Mood and Affect: Mood normal.        Behavior: Behavior normal.      No results found for any visits on 08/20/23.  Recent Results (from the past 2160 hours)  Basic metabolic panel     Status: Abnormal    Collection Time: 07/23/23  9:36 AM  Result Value Ref Range   Sodium 139 135 - 145 mmol/L   Potassium 3.5 3.5 - 5.1 mmol/L   Chloride 102 98 - 111 mmol/L   CO2 24 22 - 32 mmol/L   Glucose, Bld 101 (H) 70 - 99 mg/dL    Comment: Glucose reference range applies only to samples taken after fasting for at least 8 hours.   BUN 12 8 - 23 mg/dL   Creatinine, Ser 8.11 (H) 0.44 - 1.00 mg/dL   Calcium 9.0 8.9 - 91.4 mg/dL   GFR, Estimated 55 (L) >60 mL/min    Comment: (NOTE) Calculated using the CKD-EPI Creatinine Equation (2021)    Anion gap 13 5 - 15    Comment: Performed at [facility], [address] [place], [place] [address]  CBC     Status: Abnormal   Collection Time: 07/23/23  9:36 AM  Result Value Ref Range   WBC 10.7 (H) 4.0 - 10.5 K/uL   RBC 4.17 3.87 - 5.11 MIL/uL   Hemoglobin 12.3 12.0 - 15.0 g/dL   HCT 62.1 30.8 - 65.7 %   MCV 89.0 80.0 - 100.0 fL   MCH 29.5 26.0 - 34.0 pg   MCHC 33.2 30.0 - 36.0 g/dL   RDW 84.6 96.2 - 95.2 %   Platelets 278 150 - 400 K/uL   nRBC 0.0 0.0 - 0.2 %    Comment: Performed at [facility], [address], [place], [place] [address]  Troponin I (High Sensitivity)     Status: Abnormal   Collection Time: 07/23/23  9:36 AM  Result Value  Ref Range   Troponin I (High Sensitivity) 24 (H) <18 ng/L    Comment: (NOTE) Elevated high sensitivity troponin I (hsTnI) values and significant  changes across serial measurements may suggest ACS but many other  chronic and acute conditions are known to elevate hsTnI results.  Refer to the "Links" section for chest pain algorithms and additional  guidance. Performed at [facility], [address] [place], [place] [address]   Hepatic function panel     Status: Abnormal   Collection Time: 07/23/23  9:36 AM  Result Value Ref Range   Total Protein 7.3 6.5 - 8.1 g/dL   Albumin 3.2 (L) 3.5 - 5.0 g/dL   AST 27 15 - 41 U/L   ALT 19 0 - 44 U/L   Alkaline Phosphatase 79 38 - 126 U/L    Total Bilirubin 1.1 0.0 - 1.2 mg/dL   Bilirubin, Direct 0.2 0.0 - 0.2 mg/dL   Indirect Bilirubin 0.9 0.3 - 0.9 mg/dL    Comment: Performed at [facility], [address] [place], [place] [address]  Urinalysis, Routine w reflex microscopic -Urine, Clean Catch     Status: Abnormal   Collection Time: 07/23/23 10:25 AM  Result Value Ref Range   Color, Urine YELLOW (A) YELLOW   APPearance HAZY (A) CLEAR   Specific Gravity, Urine 1.011 1.005 - 1.030   pH 7.0 5.0 - 8.0   Glucose, UA NEGATIVE NEGATIVE mg/dL   Hgb urine dipstick NEGATIVE NEGATIVE   Bilirubin Urine NEGATIVE NEGATIVE   Ketones, ur 5 (A) NEGATIVE mg/dL   Protein, ur NEGATIVE NEGATIVE mg/dL   Nitrite NEGATIVE NEGATIVE   Leukocytes,Ua LARGE (A) NEGATIVE   RBC / HPF 0-5 0 - 5 RBC/hpf   WBC, UA 21-50 0 - 5 WBC/hpf   Bacteria, UA RARE (A) NONE SEEN   Squamous Epithelial / HPF 0-5 0 - 5 /HPF    Comment: Performed at [facility], [address] [place], [place] [address]  Urine Culture     Status: Abnormal   Collection Time: 07/23/23 10:25 AM   Specimen: Urine, Clean Catch  Result Value Ref Range   Specimen Description      URINE, CLEAN CATCH Performed at [facility], [address], [place], [place] [address]    Special Requests      NONE Performed at [facility], [address], [place], [place] [address]    Culture MULTIPLE SPECIES PRESENT, SUGGEST RECOLLECTION (A)    Report Status 07/24/2023 FINAL   Lactic acid, plasma     Status: None   Collection Time: 07/23/23 10:42 AM  Result Value Ref Range   Lactic Acid, Venous 1.5 0.5 - 1.9 mmol/L    Comment: Performed at [facility], [address] [place], [place] [address]  Procalcitonin     Status: None   Collection Time: 07/23/23 10:42 AM  Result Value Ref Range   Procalcitonin <0.10 ng/mL    Comment:        Interpretation: PCT (Procalcitonin) <= 0.5 ng/mL: Systemic infection (sepsis) is not likely. Local  bacterial infection is possible. (NOTE)       Sepsis PCT Algorithm           Lower Respiratory Tract                                      Infection PCT Algorithm    ----------------------------     ----------------------------  PCT < 0.25 ng/mL                PCT < 0.10 ng/mL          Strongly encourage             Strongly discourage   discontinuation of antibiotics    initiation of antibiotics    ----------------------------     -----------------------------       PCT 0.25 - 0.50 ng/mL            PCT 0.10 - 0.25 ng/mL               OR       >80% decrease in PCT            Discourage initiation of                                            antibiotics      Encourage discontinuation           of antibiotics    ----------------------------     -----------------------------         PCT >= 0.50 ng/mL              PCT 0.26 - 0.50 ng/mL               AND        <80% decrease in PCT             Encourage initiation of                                             antibiotics       Encourage continuation           of antibiotics    ----------------------------     -----------------------------        PCT >= 0.50 ng/mL                  PCT > 0.50 ng/mL               AND         increase in PCT                  Strongly encourage                                      initiation of antibiotics    Strongly encourage escalation           of antibiotics                                     -----------------------------                                           PCT <= 0.25 ng/mL  OR                                        > 80% decrease in PCT                                      Discontinue / Do not initiate                                             antibiotics  Performed at [facility], [address] [place], [place] [address]   Blood culture (single)     Status: None   Collection Time: 07/23/23 10:42 AM   Specimen: BLOOD   Result Value Ref Range   Specimen Description BLOOD RIGHT ANTECUBITAL    Special Requests      BOTTLES DRAWN AEROBIC AND ANAEROBIC Blood Culture adequate volume   Culture      NO GROWTH 5 DAYS Performed at [facility], [address], [place], [place] [address]    Report Status 07/28/2023 FINAL   Troponin I (High Sensitivity)     Status: Abnormal   Collection Time: 07/23/23 11:29 AM  Result Value Ref Range   Troponin I (High Sensitivity) 20 (H) <18 ng/L    Comment: (NOTE) Elevated high sensitivity troponin I (hsTnI) values and significant  changes across serial measurements may suggest ACS but many other  chronic and acute conditions are known to elevate hsTnI results.  Refer to the "Links" section for chest pain algorithms and additional  guidance. Performed at [facility], [address] [place], [place] [address]   Lactic acid, plasma     Status: None   Collection Time: 07/23/23 12:22 PM  Result Value Ref Range   Lactic Acid, Venous 1.0 0.5 - 1.9 mmol/L    Comment: Performed at [facility], [address] [place], [place] [address]  Protime-INR     Status: None   Collection Time: 07/23/23 12:22 PM  Result Value Ref Range   Prothrombin Time 13.5 11.4 - 15.2 seconds   INR 1.0 0.8 - 1.2    Comment: (NOTE) INR goal varies based on device and disease states. Performed at [facility], [address] [place], [place] [address]   APTT     Status: None   Collection Time: 07/23/23 12:22 PM  Result Value Ref Range   aPTT 32 24 - 36 seconds    Comment: Performed at [facility], [address] [place], [place] [address]  ECHOCARDIOGRAM COMPLETE     Status: None   Collection Time: 07/23/23  2:20 PM  Result Value Ref Range   Weight 2,320 oz   Height 62 in   BP 141/89 mmHg   Ao pk vel 2.01 m/s   AV Area VTI 1.67 cm2   AR max vel 1.94 cm2   AV Mean grad 9.0 mmHg   AV Peak grad 16.1 mmHg   S' Lateral 2.30 cm   AV Area  mean vel 1.49 cm2   Area-P 1/2 5.20 cm2   MV VTI 2.29 cm2   Est EF 60 - 65%   Heparin level (unfractionated)     Status: Abnormal  Collection Time: 07/23/23  8:16 PM  Result Value Ref Range   Heparin Unfractionated 0.86 (H) 0.30 - 0.70 IU/mL    Comment: (NOTE) The clinical reportable range upper limit is being lowered to >1.10 to align with the FDA approved guidance for the current laboratory assay.  If heparin results are below expected values, and patient dosage has  been confirmed, suggest follow up testing of antithrombin III levels. Performed at [facility], [address] [place], [place] [address]   CBC     Status: Abnormal   Collection Time: 07/24/23  4:41 AM  Result Value Ref Range   WBC 9.4 4.0 - 10.5 K/uL   RBC 3.68 (L) 3.87 - 5.11 MIL/uL   Hemoglobin 10.9 (L) 12.0 - 15.0 g/dL   HCT 60.4 (L) 54.0 - 98.1 %   MCV 86.4 80.0 - 100.0 fL   MCH 29.6 26.0 - 34.0 pg   MCHC 34.3 30.0 - 36.0 g/dL   RDW 19.1 47.8 - 29.5 %   Platelets 302 150 - 400 K/uL   nRBC 0.0 0.0 - 0.2 %    Comment: Performed at [facility], [address] [place], [place] [address]  Heparin level (unfractionated)     Status: None   Collection Time: 07/24/23  5:55 AM  Result Value Ref Range   Heparin Unfractionated 0.68 0.30 - 0.70 IU/mL    Comment: (NOTE) The clinical reportable range upper limit is being lowered to >1.10 to align with the FDA approved guidance for the current laboratory assay.  If heparin results are below expected values, and patient dosage has  been confirmed, suggest follow up testing of antithrombin III levels. Performed at [facility], [address] [place], [place] [address]       Assessment & Plan:  Patient advised to continue her medications.  She will take Lasix on Monday Wednesdays and Fridays.  She is already on daily spironolactone wear compression stockings regularly.  Will check her potassium at follow-up in 3 weeks Problem  List Items Addressed This Visit     Hemifacial spasm (Chronic)   Essential hypertension, benign   Relevant Medications   apixaban (ELIQUIS) 5 MG TABS tablet   Hyperlipidemia   Relevant Medications   apixaban (ELIQUIS) 5 MG TABS tablet   Prediabetes   Multiple subsegmental pulmonary emboli without acute cor pulmonale (HCC)   Relevant Medications   apixaban (ELIQUIS) 5 MG TABS tablet   Acute deep vein thrombosis (DVT) of femoral vein of left lower extremity (HCC) - Primary   Relevant Medications   apixaban (ELIQUIS) 5 MG TABS tablet    Return in about 3 weeks (around 09/10/2023).   Total time spent: 30 minutes  Margaretann Loveless, MD  08/20/2023   This document may have been prepared by [facility] Voice Recognition software and as such may include unintentional dictation errors.

## 2023-08-23 ENCOUNTER — Other Ambulatory Visit: Payer: Self-pay | Admitting: Internal Medicine

## 2023-08-23 DIAGNOSIS — G2581 Restless legs syndrome: Secondary | ICD-10-CM

## 2023-09-10 ENCOUNTER — Ambulatory Visit: Admitting: Internal Medicine

## 2023-09-10 ENCOUNTER — Encounter: Payer: Self-pay | Admitting: Internal Medicine

## 2023-09-10 VITALS — BP 114/74 | HR 58 | Ht 62.0 in | Wt 139.8 lb

## 2023-09-10 DIAGNOSIS — I82412 Acute embolism and thrombosis of left femoral vein: Secondary | ICD-10-CM | POA: Diagnosis not present

## 2023-09-10 DIAGNOSIS — E782 Mixed hyperlipidemia: Secondary | ICD-10-CM

## 2023-09-10 DIAGNOSIS — I10 Essential (primary) hypertension: Secondary | ICD-10-CM

## 2023-09-10 DIAGNOSIS — I2694 Multiple subsegmental pulmonary emboli without acute cor pulmonale: Secondary | ICD-10-CM | POA: Diagnosis not present

## 2023-09-10 DIAGNOSIS — R7303 Prediabetes: Secondary | ICD-10-CM | POA: Diagnosis not present

## 2023-09-10 NOTE — Progress Notes (Signed)
 Established Patient Office Visit  Subjective:  Patient ID: Patricia Knox, female    DOB: 11-13-1935  Age: 88 y.o. MRN: [id number]  Chief Complaint  Patient presents with   Follow-up    3 week follow up    Patient comes in for follow-up of her DVT/PE.  Her leg swelling is completely resolved on Lasix, Monday Wednesdays and Fridays.  She is currently taking her Eliquis twice a day as well.  Denies chest pain or palpitations no shortness of breath.  She is also not having any leg/calf muscle pain.  Generally she is feeling well.  Getting labs today.    No other concerns at this time.   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   COLONOSCOPY  2002   TOE SURGERY  1999    Social History   Socioeconomic History   Marital status: Single    Spouse name: Not on file   Number of children: 3   Years of education: HS   Highest education level: Not on file  Occupational History   Occupation: Retired  Tobacco Use   Smoking status: Never   Smokeless tobacco: Never  Substance and Sexual Activity   Alcohol use:  No   Drug use: No   Sexual activity: Not on file  Other Topics Concern   Not on file  Social History Narrative   Lives at home with husband.   Right-handed.   No daily caffeine use.   Social Drivers of Corporate investment banker Strain: Not on file  Food Insecurity: No Food Insecurity (07/23/2023)   Hunger Vital Sign    Worried About Running Out of Food in the Last Year: Never true    Ran Out of Food in the Last Year: Never true  Transportation Needs: No Transportation Needs (07/23/2023)   PRAPARE - Administrator, Civil Service (Medical): No    Lack of Transportation (Non-Medical): No  Physical Activity: Not on file  Stress: Not on file  Social Connections: Unknown (07/23/2023)   Social Connection and Isolation Panel [NHANES]    Frequency of Communication with Friends and Family: More than three times a week    Frequency of Social Gatherings with Friends and Family: More than three times a week    Attends Religious Services: Patient declined    Database administrator or Organizations: Patient declined    Attends Banker Meetings: Patient declined    Marital Status: Widowed  Intimate Partner Violence: Not At Risk (07/23/2023)  Humiliation, Afraid, Rape, and Kick questionnaire    Fear of Current or Ex-Partner: No    Emotionally Abused: No    Physically Abused: No    Sexually Abused: No    Family History  Problem Relation Age of Onset   Stroke Mother    Heart attack Father    Breast cancer Neg Hx     No Known Allergies  Outpatient Medications Prior to Visit  Medication Sig   abobotulinumtoxinA (DYSPORT) 300 units SOLR injection Inject 300 Units into the muscle every 3 (three) months.   alendronate (FOSAMAX) 70 MG tablet Take 1 tablet by mouth once a week   amLODipine (NORVASC) 5 MG tablet Take 1 tablet by mouth once daily   apixaban (ELIQUIS) 5 MG TABS tablet Take 2 tablets (10mg ) twice daily for 7 days, then 1 tablet (5mg ) twice daily    aspirin EC 81 MG tablet Take 1 tablet (81 mg total) by mouth daily. Swallow whole.   atorvastatin (LIPITOR) 40 MG tablet TAKE 1 TABLET BY MOUTH ONCE DAILY AT BEDTIME   Calcium Carbonate-Vitamin D (CALTRATE 600+D PO) Take 1 capsule by mouth 2 (two) times daily.   furosemide (LASIX) 20 MG tablet Take 1 tablet (20 mg total) by mouth daily.   gabapentin (NEURONTIN) 100 MG capsule TAKE 3 CAPSULES BY MOUTH AT BEDTIME   omeprazole (PRILOSEC) 20 MG capsule Take 1 capsule by mouth twice daily   rOPINIRole (REQUIP) 1 MG tablet Take 1 tablet by mouth twice daily   spironolactone (ALDACTONE) 25 MG tablet Take 1 tablet by mouth once daily   traZODone (DESYREL) 100 MG tablet TAKE 1 TABLET BY MOUTH NIGHTLY AT BEDTIME AS NEEDED FOR INSOMNIA   nitrofurantoin, macrocrystal-monohydrate, (MACROBID) 100 MG capsule Take 100 mg by mouth 2 (two) times daily. (Patient not taking: Reported on 09/10/2023)   [DISCONTINUED] apixaban (ELIQUIS) 5 MG TABS tablet Take 2 tablets (10 mg total) by mouth 2 (two) times daily for 7 days, THEN 1 tablet (5 mg total) 2 (two) times daily. (Patient not taking: Reported on 09/10/2023)   No facility-administered medications prior to visit.    Review of Systems  Constitutional: Negative.  Negative for chills, fever, malaise/fatigue and weight loss.  HENT: Negative.  Negative for congestion, sinus pain and sore throat.   Eyes: Negative.   Respiratory: Negative.  Negative for cough and shortness of breath.   Cardiovascular: Negative.  Negative for chest pain, palpitations and leg swelling.  Gastrointestinal: Negative.  Negative for abdominal pain, constipation, diarrhea, heartburn, nausea and vomiting.  Genitourinary: Negative.  Negative for dysuria and flank pain.  Musculoskeletal: Negative.  Negative for joint pain and myalgias.  Skin: Negative.   Neurological:  Negative for dizziness, tremors, sensory change and headaches.  Endo/Heme/Allergies: Negative.   Psychiatric/Behavioral:  Negative.  Negative for depression and suicidal ideas. The patient is not nervous/anxious.        Objective:   BP 114/74   Pulse (!) 58   Ht 5\' 2"  (1.575 m)   Wt 139 lb 12.8 oz (63.4 kg)   SpO2 99%   BMI 25.57 kg/m   Vitals:   09/10/23 1034  BP: 114/74  Pulse: (!) 58  Height: 5\' 2"  (1.575 m)  Weight: 139 lb 12.8 oz (63.4 kg)  SpO2: 99%  BMI (Calculated): 25.56    Physical Exam Vitals and nursing note reviewed.  Constitutional:      Appearance: Normal appearance.  HENT:     Head: Normocephalic and atraumatic.  Nose: Nose normal.     Mouth/Throat:     Mouth: Mucous membranes are moist.     Pharynx: Oropharynx is clear.  Eyes:     Conjunctiva/sclera: Conjunctivae normal.     Pupils: Pupils are equal, round, and reactive to light.  Cardiovascular:     Rate and Rhythm: Normal rate and regular rhythm.     Pulses: Normal pulses.     Heart sounds: Normal heart sounds. No murmur heard. Pulmonary:     Effort: Pulmonary effort is normal.     Breath sounds: Normal breath sounds. No wheezing.  Abdominal:     General: Bowel sounds are normal.     Palpations: Abdomen is soft.     Tenderness: There is no abdominal tenderness. There is no right CVA tenderness or left CVA tenderness.  Musculoskeletal:        General: Normal range of motion.     Cervical back: Normal range of motion.     Right lower leg: No edema.     Left lower leg: No edema.  Skin:    General: Skin is warm and dry.  Neurological:     General: No focal deficit present.     Mental Status: She is alert and oriented to person, place, and time.  Psychiatric:        Mood and Affect: Mood normal.        Behavior: Behavior normal.      No results found for any visits on 09/10/23.  Recent Results (from the past 2160 hours)  Basic metabolic panel     Status: Abnormal   Collection Time: 07/23/23  9:36 AM  Result Value Ref Range   Sodium 139 135 - 145 mmol/L   Potassium 3.5 3.5 - 5.1 mmol/L   Chloride  102 98 - 111 mmol/L   CO2 24 22 - 32 mmol/L   Glucose, Bld 101 (H) 70 - 99 mg/dL    Comment: Glucose reference range applies only to samples taken after fasting for at least 8 hours.   BUN 12 8 - 23 mg/dL   Creatinine, Ser 4.09 (H) 0.44 - 1.00 mg/dL   Calcium 9.0 8.9 - 81.1 mg/dL   GFR, Estimated 55 (L) >60 mL/min    Comment: (NOTE) Calculated using the CKD-EPI Creatinine Equation (2021)    Anion gap 13 5 - 15    Comment: Performed at [facility], [address] [place], [place] [address]  CBC     Status: Abnormal   Collection Time: 07/23/23  9:36 AM  Result Value Ref Range   WBC 10.7 (H) 4.0 - 10.5 K/uL   RBC 4.17 3.87 - 5.11 MIL/uL   Hemoglobin 12.3 12.0 - 15.0 g/dL   HCT 29.5 62.1 - 30.8 %   MCV 89.0 80.0 - 100.0 fL   MCH 29.5 26.0 - 34.0 pg   MCHC 33.2 30.0 - 36.0 g/dL   RDW 65.7 84.6 - 96.2 %   Platelets 278 150 - 400 K/uL   nRBC 0.0 0.0 - 0.2 %    Comment: Performed at [facility], [address] [place], [place] [address]  Troponin I (High Sensitivity)     Status: Abnormal   Collection Time: 07/23/23  9:36 AM  Result Value Ref Range   Troponin I (High Sensitivity) 24 (H) <18 ng/L    Comment: (NOTE) Elevated high sensitivity troponin I (hsTnI) values and significant  changes across serial measurements may suggest ACS but many other  chronic  and acute conditions are known to elevate hsTnI results.  Refer to the "Links" section for chest pain algorithms and additional  guidance. Performed at [facility], [address] [place], [place] [address]   Hepatic function panel     Status: Abnormal   Collection Time: 07/23/23  9:36 AM  Result Value Ref Range   Total Protein 7.3 6.5 - 8.1 g/dL   Albumin 3.2 (L) 3.5 - 5.0 g/dL   AST 27 15 - 41 U/L   ALT 19 0 - 44 U/L   Alkaline Phosphatase 79 38 - 126 U/L   Total Bilirubin 1.1 0.0 - 1.2 mg/dL   Bilirubin, Direct 0.2 0.0 - 0.2 mg/dL   Indirect Bilirubin 0.9 0.3 - 0.9 mg/dL    Comment:  Performed at [facility], [address] [place], [place] [address]  Urinalysis, Routine w reflex microscopic -Urine, Clean Catch     Status: Abnormal   Collection Time: 07/23/23 10:25 AM  Result Value Ref Range   Color, Urine YELLOW (A) YELLOW   APPearance HAZY (A) CLEAR   Specific Gravity, Urine 1.011 1.005 - 1.030   pH 7.0 5.0 - 8.0   Glucose, UA NEGATIVE NEGATIVE mg/dL   Hgb urine dipstick NEGATIVE NEGATIVE   Bilirubin Urine NEGATIVE NEGATIVE   Ketones, ur 5 (A) NEGATIVE mg/dL   Protein, ur NEGATIVE NEGATIVE mg/dL   Nitrite NEGATIVE NEGATIVE   Leukocytes,Ua LARGE (A) NEGATIVE   RBC / HPF 0-5 0 - 5 RBC/hpf   WBC, UA 21-50 0 - 5 WBC/hpf   Bacteria, UA RARE (A) NONE SEEN   Squamous Epithelial / HPF 0-5 0 - 5 /HPF    Comment: Performed at [facility], [address] [place], [place] [address]  Urine Culture     Status: Abnormal   Collection Time: 07/23/23 10:25 AM   Specimen: Urine, Clean Catch  Result Value Ref Range   Specimen Description      URINE, CLEAN CATCH Performed at [facility], [address], [place], [place] [address]    Special Requests      NONE Performed at [facility], [address] [place], [place] [address]    Culture MULTIPLE SPECIES PRESENT, SUGGEST RECOLLECTION (A)    Report Status 07/24/2023 FINAL   Lactic acid, plasma     Status: None   Collection Time: 07/23/23 10:42 AM  Result Value Ref Range   Lactic Acid, Venous 1.5 0.5 - 1.9 mmol/L    Comment: Performed at [facility], [address] [place], [place] [address]  Procalcitonin     Status: None   Collection Time: 07/23/23 10:42 AM  Result Value Ref Range   Procalcitonin <0.10 ng/mL    Comment:        Interpretation: PCT (Procalcitonin) <= 0.5 ng/mL: Systemic infection (sepsis) is not likely. Local bacterial infection is possible. (NOTE)       Sepsis PCT Algorithm           Lower Respiratory Tract                                       Infection PCT Algorithm    ----------------------------     ----------------------------         PCT < 0.25 ng/mL                PCT < 0.10 ng/mL  Strongly encourage             Strongly discourage   discontinuation of antibiotics    initiation of antibiotics    ----------------------------     -----------------------------       PCT 0.25 - 0.50 ng/mL            PCT 0.10 - 0.25 ng/mL               OR       >80% decrease in PCT            Discourage initiation of                                            antibiotics      Encourage discontinuation           of antibiotics    ----------------------------     -----------------------------         PCT >= 0.50 ng/mL              PCT 0.26 - 0.50 ng/mL               AND        <80% decrease in PCT             Encourage initiation of                                             antibiotics       Encourage continuation           of antibiotics    ----------------------------     -----------------------------        PCT >= 0.50 ng/mL                  PCT > 0.50 ng/mL               AND         increase in PCT                  Strongly encourage                                      initiation of antibiotics    Strongly encourage escalation           of antibiotics                                     -----------------------------                                           PCT <= 0.25 ng/mL                                                 OR                                        >  80% decrease in PCT                                      Discontinue / Do not initiate                                             antibiotics  Performed at [facility], [address] [place], [place] [address]   Blood culture (single)     Status: None   Collection Time: 07/23/23 10:42 AM   Specimen: BLOOD  Result Value Ref Range   Specimen Description BLOOD RIGHT ANTECUBITAL    Special Requests      BOTTLES DRAWN AEROBIC AND ANAEROBIC  Blood Culture adequate volume   Culture      NO GROWTH 5 DAYS Performed at [facility], [address] [place], [place] [address]    Report Status 07/28/2023 FINAL   Troponin I (High Sensitivity)     Status: Abnormal   Collection Time: 07/23/23 11:29 AM  Result Value Ref Range   Troponin I (High Sensitivity) 20 (H) <18 ng/L    Comment: (NOTE) Elevated high sensitivity troponin I (hsTnI) values and significant  changes across serial measurements may suggest ACS but many other  chronic and acute conditions are known to elevate hsTnI results.  Refer to the "Links" section for chest pain algorithms and additional  guidance. Performed at [facility], [address] [place], [place] [address]   Lactic acid, plasma     Status: None   Collection Time: 07/23/23 12:22 PM  Result Value Ref Range   Lactic Acid, Venous 1.0 0.5 - 1.9 mmol/L    Comment: Performed at [facility], [address] [place], [place] [address]  Protime-INR     Status: None   Collection Time: 07/23/23 12:22 PM  Result Value Ref Range   Prothrombin Time 13.5 11.4 - 15.2 seconds   INR 1.0 0.8 - 1.2    Comment: (NOTE) INR goal varies based on device and disease states. Performed at [facility], [address] [place], [place] [address]   APTT     Status: None   Collection Time: 07/23/23 12:22 PM  Result Value Ref Range   aPTT 32 24 - 36 seconds    Comment: Performed at [facility], [address] [place], [place] [address]  ECHOCARDIOGRAM COMPLETE     Status: None   Collection Time: 07/23/23  2:20 PM  Result Value Ref Range   Weight 2,320 oz   Height 62 in   BP 141/89 mmHg   Ao pk vel 2.01 m/s   AV Area VTI 1.67 cm2   AR max vel 1.94 cm2   AV Mean grad 9.0 mmHg   AV Peak grad 16.1 mmHg   S' Lateral 2.30 cm   AV Area mean vel 1.49 cm2   Area-P 1/2 5.20 cm2   MV VTI 2.29 cm2   Est EF 60 - 65%   Heparin level (unfractionated)     Status: Abnormal    Collection Time: 07/23/23  8:16 PM  Result Value Ref Range   Heparin Unfractionated 0.86 (H) 0.30 - 0.70 IU/mL    Comment: (NOTE) The clinical reportable range upper limit is being lowered to >1.10 to align with  the FDA approved guidance for the current laboratory assay.  If heparin results are below expected values, and patient dosage has  been confirmed, suggest follow up testing of antithrombin III levels. Performed at [facility], [address] [place], [place] [address]   CBC     Status: Abnormal   Collection Time: 07/24/23  4:41 AM  Result Value Ref Range   WBC 9.4 4.0 - 10.5 K/uL   RBC 3.68 (L) 3.87 - 5.11 MIL/uL   Hemoglobin 10.9 (L) 12.0 - 15.0 g/dL   HCT 13.0 (L) 86.5 - 78.4 %   MCV 86.4 80.0 - 100.0 fL   MCH 29.6 26.0 - 34.0 pg   MCHC 34.3 30.0 - 36.0 g/dL   RDW 69.6 29.5 - 28.4 %   Platelets 302 150 - 400 K/uL   nRBC 0.0 0.0 - 0.2 %    Comment: Performed at [facility], [address] [place], [place] [address]  Heparin level (unfractionated)     Status: None   Collection Time: 07/24/23  5:55 AM  Result Value Ref Range   Heparin Unfractionated 0.68 0.30 - 0.70 IU/mL    Comment: (NOTE) The clinical reportable range upper limit is being lowered to >1.10 to align with the FDA approved guidance for the current laboratory assay.  If heparin results are below expected values, and patient dosage has  been confirmed, suggest follow up testing of antithrombin III levels. Performed at [facility], [address] [place], [place] [address]       Assessment & Plan:  Continue medications.  Check labs. Problem List Items Addressed This Visit     Essential hypertension, benign - Primary   Relevant Orders   CMP14+EGFR   Hyperlipidemia   Relevant Orders   Lipid Panel w/o Chol/HDL Ratio   Prediabetes   Relevant Orders   Hemoglobin A1c   Multiple subsegmental pulmonary emboli without acute cor pulmonale (HCC)   Acute deep vein  thrombosis (DVT) of femoral vein of left lower extremity (HCC)   Relevant Orders   CBC with Diff    Return in about 2 months (around 11/10/2023).   Total time spent: 25 minutes  Margaretann Loveless, MD  09/10/2023   This document may have been prepared by [facility] Voice Recognition software and as such may include unintentional dictation errors.

## 2023-09-11 LAB — CBC WITH DIFFERENTIAL/PLATELET
Basophils Absolute: 0.1 10*3/uL (ref 0.0–0.2)
Basos: 1 %
EOS (ABSOLUTE): 0.1 10*3/uL (ref 0.0–0.4)
Eos: 1 %
Hematocrit: 34.1 % (ref 34.0–46.6)
Hemoglobin: 11.1 g/dL (ref 11.1–15.9)
Immature Grans (Abs): 0 10*3/uL (ref 0.0–0.1)
Immature Granulocytes: 0 %
Lymphocytes Absolute: 1.6 10*3/uL (ref 0.7–3.1)
Lymphs: 22 %
MCH: 29.3 pg (ref 26.6–33.0)
MCHC: 32.6 g/dL (ref 31.5–35.7)
MCV: 90 fL (ref 79–97)
Monocytes Absolute: 0.7 10*3/uL (ref 0.1–0.9)
Monocytes: 10 %
Neutrophils Absolute: 4.8 10*3/uL (ref 1.4–7.0)
Neutrophils: 66 %
Platelets: 331 10*3/uL (ref 150–450)
RBC: 3.79 x10E6/uL (ref 3.77–5.28)
RDW: 13.2 % (ref 11.7–15.4)
WBC: 7.3 10*3/uL (ref 3.4–10.8)

## 2023-09-11 LAB — CMP14+EGFR
ALT: 15 IU/L (ref 0–32)
AST: 24 IU/L (ref 0–40)
Albumin: 4 g/dL (ref 3.7–4.7)
Alkaline Phosphatase: 85 IU/L (ref 44–121)
BUN/Creatinine Ratio: 11 — ABNORMAL LOW (ref 12–28)
BUN: 13 mg/dL (ref 8–27)
Bilirubin Total: 0.4 mg/dL (ref 0.0–1.2)
CO2: 25 mmol/L (ref 20–29)
Calcium: 9.4 mg/dL (ref 8.7–10.3)
Chloride: 102 mmol/L (ref 96–106)
Creatinine, Ser: 1.23 mg/dL — ABNORMAL HIGH (ref 0.57–1.00)
Globulin, Total: 3 g/dL (ref 1.5–4.5)
Glucose: 87 mg/dL (ref 70–99)
Potassium: 4 mmol/L (ref 3.5–5.2)
Sodium: 142 mmol/L (ref 134–144)
Total Protein: 7 g/dL (ref 6.0–8.5)
eGFR: 43 mL/min/{1.73_m2} — ABNORMAL LOW (ref >59)

## 2023-09-11 LAB — LIPID PANEL W/O CHOL/HDL RATIO
Cholesterol, Total: 154 mg/dL (ref 100–199)
HDL: 61 mg/dL (ref >39)
LDL Chol Calc (NIH): 76 mg/dL (ref 0–99)
Triglycerides: 95 mg/dL (ref 0–149)
VLDL Cholesterol Cal: 17 mg/dL (ref 5–40)

## 2023-09-11 LAB — HEMOGLOBIN A1C
Est. average glucose Bld gHb Est-mCnc: 114 mg/dL
Hgb A1c MFr Bld: 5.6 % (ref 4.8–5.6)

## 2023-09-14 ENCOUNTER — Telehealth: Payer: Self-pay | Admitting: Neurology

## 2023-09-14 NOTE — Telephone Encounter (Signed)
 Submitted auth renewal request via [employer] portal, received approval for B/B. Auth#: [id number] (09/14/23-09/13/24)

## 2023-09-30 ENCOUNTER — Ambulatory Visit: Payer: Medicare Other | Admitting: Neurology

## 2023-10-05 ENCOUNTER — Ambulatory Visit: Payer: Medicare Other | Admitting: Internal Medicine

## 2023-10-07 ENCOUNTER — Ambulatory Visit: Payer: Medicare Other | Admitting: Neurology

## 2023-10-07 VITALS — BP 125/73

## 2023-10-07 DIAGNOSIS — G5132 Clonic hemifacial spasm, left: Secondary | ICD-10-CM | POA: Diagnosis not present

## 2023-10-07 MED ORDER — ABOBOTULINUMTOXINA 300 UNITS IM SOLR
300.0000 [IU] | Freq: Once | INTRAMUSCULAR | Status: AC
  Administered 2023-10-07: 300 [IU] via INTRAMUSCULAR

## 2023-10-07 NOTE — Progress Notes (Signed)
 PATIENT: Patricia Knox  DOB: [birth year]  Chief Complaint  Patient presents with   Hemifacial Spasms    Dysport  300 units x 1 vial - office supply     HISTORICAL  Patricia Knox is a 88 year old right-handed female, seen in refer by her primary care doctor  Aisha Hove for continued EMG guided injection for her left hemifacial spasm.  She had a gradual onset left hemifacial spasm for many years,  She began to receive EMG guided incobotulism toxin a by Dr. Ann Barnacle at [facility] works very well for her, last injection was in July 2017,used total 27.5 units, but because of the insurance reason she has to switch to different practice,she hopes to continue injection through our office.  She had a history of restless leg syndrome, complains of frequent bilateral calf muscle cramping, increased recently, woke her up few times each night, despite taking Requip  0.5 milligrams twice a day,  UPDATE Dec 27th 2017: She came in for EMG guided xeomin  injection for left hemifacial spasm today, potential side effect pain  UPDATE September 25 2016: She had suboptimal response to previous injection, noticed left cheek area twitching, no significant side effect noticed,  Gabapentin  100 mg 3 tablets every night has worked well for her nighttime leg cramping.  UPDATE January 07 2017:  she responded very well to previous injection, no significant side effect noted  UPDATE Apr 15 2017:  She responded very well to previous injection, noticed recurrent left eye twitching, left cheek muscle twitching now  Update July 20, 2017: She responded well to previous injection, there was no significant side effect noticed, only recent few weeks she noticed recurrent frequent left eye twitching, involving left cheek as well,  UPDATE Oct 20 2017: She responded very well to previous injection  UPDATE January 12 2018: She responded well to previous injection,   UPDATE Apr 14 2018: She did very  well with previous injection, this time will use Botox  a 100 units  UPDATE Jul 15 2018: She did well with previous injection  UPDATE November 10 2018: She responded well to previous injections.  UPDATE Oct 25 2019: She did well, no significant side effect noted.  UPDATE May 09 2020: She had long interview from previous injection, complains of worsening recurrent left hemifacial muscle spasm  UPDATE October 31 2020: It has been 6 months since previous injection due to insurance reasons, she has to switch to Dysport  300 units per insurance, previously only used Xeomin  50 units, she not noticed frequent left hemifacial muscle spasm to the point of closing her left eye sometimes  UPDATE January 30 2021: She responded well to previous injection,   Update May 01, 2021: She responded well to previous injection, no significant side effect noted  UPDATE September 24 2021: She responded well to injection, did notice facial asymmetry, low-lying left nasolabial fold  Update February 26, 2022 This is a 18-month from previous injection, she noticed increased left facial muscle spasm  UPDATE Jul 07 2022: She tolerated previous injection, did help her muscle spasm, has facial asymmetry, low-lying left nasolabial fold  UPDATE January 01 2023: Responded well to previous injection,  PHYSICAL EXAM   Vitals:   04/15/17 1350  BP: 124/77  Pulse: 68  Weight: 155 lb (70.3 kg)  Height: 5\' 2"  (1.575 m)   Body mass index is 28.35 kg/m.    She has occasionally left hemifacial spasm, involving left orbicularis oculi, occasional nvolvement of left cheek, left  frontalis, and the orbicularis auris, platysmas muscles. Mild asymmetry of left face, droopiness of left face     ASSESSMENT AND PLAN  Patricia Knox is a 88 y.o. female    Left hemifacial spasm Dysport  300 units units into 1.5 cc of normal saline, 10 units/0.05cc) (please divide the content into 1cc syringe for facial injection)  0.05 cc at each  injection site  12 x0.05 cc(= 10 units) =120 units      Patricia Knox, M.D. Ph.D.  [facility] [address] [place], [place] [address] Ph: [phone] Fax: [phone]  CC: Aisha Hove, MD

## 2023-10-07 NOTE — Progress Notes (Signed)
 Dysport  300units x 1 vial  [id number] Exp-04/01/25 B/B  Bacteriostatic 0.9% Sodium Chloride- 1.5 mL  Lot: [id number] Expiration: 04/02/24 NDC: 2952841324 Dx: M01.02  WITNESSED VO:[id number] J RN

## 2023-10-11 ENCOUNTER — Other Ambulatory Visit: Payer: Self-pay | Admitting: Neurology

## 2023-10-18 ENCOUNTER — Other Ambulatory Visit: Payer: Self-pay | Admitting: Internal Medicine

## 2023-10-18 DIAGNOSIS — K219 Gastro-esophageal reflux disease without esophagitis: Secondary | ICD-10-CM

## 2023-10-29 DIAGNOSIS — L821 Other seborrheic keratosis: Secondary | ICD-10-CM | POA: Diagnosis not present

## 2023-10-29 DIAGNOSIS — I872 Venous insufficiency (chronic) (peripheral): Secondary | ICD-10-CM | POA: Diagnosis not present

## 2023-11-02 ENCOUNTER — Other Ambulatory Visit: Payer: Self-pay | Admitting: Family

## 2023-11-02 DIAGNOSIS — F411 Generalized anxiety disorder: Secondary | ICD-10-CM

## 2023-11-12 ENCOUNTER — Ambulatory Visit: Admitting: Internal Medicine

## 2023-12-30 ENCOUNTER — Ambulatory Visit: Admitting: Neurology

## 2023-12-30 ENCOUNTER — Encounter: Payer: Self-pay | Admitting: Neurology

## 2023-12-30 VITALS — BP 124/73 | HR 74 | Ht 62.0 in | Wt 136.0 lb

## 2023-12-30 DIAGNOSIS — G5132 Clonic hemifacial spasm, left: Secondary | ICD-10-CM

## 2023-12-30 MED ORDER — ABOBOTULINUMTOXINA 300 UNITS IM SOLR
300.0000 [IU] | Freq: Once | INTRAMUSCULAR | Status: AC
  Administered 2023-12-30: 300 [IU] via INTRAMUSCULAR

## 2023-12-30 NOTE — Progress Notes (Signed)
    PATIENT: Patricia Knox  DOB: 1936/05/31  Chief Complaint  Patient presents with   Hemifacial Spasms    Dysport  300 units x 1 vial - office supply     HISTORICAL  Patricia Knox is a 88 year old right-handed female, seen in refer by her primary care doctor  Fredy GORMAN Bathe for continued EMG guided injection for her left hemifacial spasm.  She had a gradual onset left hemifacial spasm for many years,  She began to receive EMG guided incobotulism toxin a by Dr. Ozell Loyal at [facility] works very well for her, last injection was in July 2017,used total 27.5 units, but because of the insurance reason she has to switch to different practice,she hopes to continue injection through our office.  She had a history of restless leg syndrome, complains of frequent bilateral calf muscle cramping, increased recently, woke her up few times each night, despite taking Requip  0.5 milligrams twice a day,   She came in for EMG guided xeomin  injection for left hemifacial spasm today, first injection was on May 28, 2016   Over the years, she has been coming in every 3 to 4 months for repeat injection for left hemifacial spasm, using Dysport , tolerating it well, did develop mild facial symmetry   PHYSICAL EXAM   Vitals:   04/15/17 1350  BP: 124/77  Pulse: 68  Weight: 155 lb (70.3 kg)  Height: 5' 2 (1.575 m)   Body mass index is 28.35 kg/m.    She has occasionally left hemifacial spasm, involving left orbicularis oculi, occasional nvolvement of left cheek, left frontalis, and the orbicularis auris, platysmas muscles. Mild asymmetry of left face, droopiness of left face     ASSESSMENT AND PLAN  Patricia Knox is a 88 y.o. female    Left hemifacial spasm Dysport  300 units units into 1.5 cc of normal saline, 10 units/0.05cc) (please divide the content into 1cc syringe for facial injection)  0.05 cc at each injection site  16 x0.05 cc(= 10 units) =160 units      Modena Callander,  M.D. Ph.D.  [facility] [address], [address] [place], [place] [address] Ph: [phone] Fax: [phone]  CC: Fredy GORMAN Bathe, MD

## 2023-12-30 NOTE — Progress Notes (Signed)
 Dysport  300units x 1 vial  [phone] [id number] Exp-05/01/25 B/B  Bacteriostatic 0.9% Sodium Chloride- 1.5 mL  Lot: [id number] Expiration: 04/01/25 NDC: 9590803397 Dx:  H48.67  WITNESSED BY:A JOSHUA RN

## 2024-01-14 ENCOUNTER — Other Ambulatory Visit: Payer: Self-pay | Admitting: Neurology

## 2024-02-02 DIAGNOSIS — I872 Venous insufficiency (chronic) (peripheral): Secondary | ICD-10-CM | POA: Diagnosis not present

## 2024-02-02 DIAGNOSIS — L0390 Cellulitis, unspecified: Secondary | ICD-10-CM | POA: Diagnosis not present

## 2024-02-07 ENCOUNTER — Other Ambulatory Visit: Payer: Self-pay | Admitting: Internal Medicine

## 2024-02-07 DIAGNOSIS — G2581 Restless legs syndrome: Secondary | ICD-10-CM

## 2024-02-08 ENCOUNTER — Other Ambulatory Visit: Payer: Self-pay | Admitting: Internal Medicine

## 2024-02-08 DIAGNOSIS — F411 Generalized anxiety disorder: Secondary | ICD-10-CM

## 2024-02-09 DIAGNOSIS — I872 Venous insufficiency (chronic) (peripheral): Secondary | ICD-10-CM | POA: Diagnosis not present

## 2024-02-10 ENCOUNTER — Other Ambulatory Visit: Payer: Self-pay | Admitting: Internal Medicine

## 2024-02-10 DIAGNOSIS — I10 Essential (primary) hypertension: Secondary | ICD-10-CM

## 2024-03-29 DIAGNOSIS — I890 Lymphedema, not elsewhere classified: Secondary | ICD-10-CM | POA: Diagnosis not present

## 2024-03-29 DIAGNOSIS — I872 Venous insufficiency (chronic) (peripheral): Secondary | ICD-10-CM | POA: Diagnosis not present

## 2024-03-29 DIAGNOSIS — L218 Other seborrheic dermatitis: Secondary | ICD-10-CM | POA: Diagnosis not present

## 2024-04-13 ENCOUNTER — Ambulatory Visit: Admitting: Neurology

## 2024-04-13 VITALS — BP 118/86

## 2024-04-13 DIAGNOSIS — G5132 Clonic hemifacial spasm, left: Secondary | ICD-10-CM | POA: Diagnosis not present

## 2024-04-13 MED ORDER — ABOBOTULINUMTOXINA 300 UNITS IM SOLR
300.0000 [IU] | Freq: Once | INTRAMUSCULAR | Status: AC
  Administered 2024-04-15: 300 [IU] via INTRAMUSCULAR

## 2024-04-13 NOTE — Progress Notes (Unsigned)
 Dysport  300units x 1 vial  [phone] [id number] Exp-07/02/25 B/B  Bacteriostatic 0.9% Sodium Chloride- 1.5 mL  Lot: [id number] Expiration: 04/01/25 NDC: 9590803397 Dx: H48.67  WITNESSED BY:j webb cma

## 2024-04-13 NOTE — Progress Notes (Unsigned)
    PATIENT: Patricia Knox  DOB: 1936/05/31  Chief Complaint  Patient presents with   Hemifacial Spasms    Dysport  300 units x 1 vial - office supply     HISTORICAL  Patricia Knox is a 88 year old right-handed female, seen in refer by her primary care doctor  Fredy GORMAN Bathe for continued EMG guided injection for her left hemifacial spasm.  She had a gradual onset left hemifacial spasm for many years,  She began to receive EMG guided incobotulism toxin a by Dr. Ozell Loyal at [facility] works very well for her, last injection was in July 2017,used total 27.5 units, but because of the insurance reason she has to switch to different practice,she hopes to continue injection through our office.  She had a history of restless leg syndrome, complains of frequent bilateral calf muscle cramping, increased recently, woke her up few times each night, despite taking Requip  0.5 milligrams twice a day,   She came in for EMG guided xeomin  injection for left hemifacial spasm today, first injection was on May 28, 2016   Over the years, she has been coming in every 3 to 4 months for repeat injection for left hemifacial spasm, using Dysport , tolerating it well, did develop mild facial symmetry   PHYSICAL EXAM   Vitals:   04/15/17 1350  BP: 124/77  Pulse: 68  Weight: 155 lb (70.3 kg)  Height: 5' 2 (1.575 m)   Body mass index is 28.35 kg/m.    She has occasionally left hemifacial spasm, involving left orbicularis oculi, occasional nvolvement of left cheek, left frontalis, and the orbicularis auris, platysmas muscles. Mild asymmetry of left face, droopiness of left face     ASSESSMENT AND PLAN  Patricia Knox is a 88 y.o. female    Left hemifacial spasm Dysport  300 units units into 1.5 cc of normal saline, 10 units/0.05cc) (please divide the content into 1cc syringe for facial injection)  0.05 cc at each injection site  16 x0.05 cc(= 10 units) =160 units      Modena Callander,  M.D. Ph.D.  [facility] [address], [address] [place], [place] [address] Ph: [phone] Fax: [phone]  CC: Fredy GORMAN Bathe, MD

## 2024-04-15 DIAGNOSIS — G5132 Clonic hemifacial spasm, left: Secondary | ICD-10-CM | POA: Diagnosis not present

## 2024-04-17 ENCOUNTER — Other Ambulatory Visit: Payer: Self-pay | Admitting: Neurology

## 2024-04-21 ENCOUNTER — Emergency Department

## 2024-04-21 ENCOUNTER — Other Ambulatory Visit: Payer: Self-pay

## 2024-04-21 ENCOUNTER — Encounter: Payer: Self-pay | Admitting: Hospitalist

## 2024-04-21 ENCOUNTER — Observation Stay: Admission: EM | Admit: 2024-04-21 | Discharge: 2024-04-22 | Disposition: A | Discharge status: Home / Self Care

## 2024-04-21 DIAGNOSIS — D649 Anemia, unspecified: Secondary | ICD-10-CM | POA: Diagnosis not present

## 2024-04-21 DIAGNOSIS — R52 Pain, unspecified: Secondary | ICD-10-CM

## 2024-04-21 DIAGNOSIS — R0602 Shortness of breath: Secondary | ICD-10-CM

## 2024-04-21 DIAGNOSIS — I700 Atherosclerosis of aorta: Secondary | ICD-10-CM | POA: Insufficient documentation

## 2024-04-21 DIAGNOSIS — L97519 Non-pressure chronic ulcer of other part of right foot with unspecified severity: Secondary | ICD-10-CM | POA: Diagnosis not present

## 2024-04-21 DIAGNOSIS — G2581 Restless legs syndrome: Secondary | ICD-10-CM | POA: Diagnosis not present

## 2024-04-21 DIAGNOSIS — Z7982 Long term (current) use of aspirin: Secondary | ICD-10-CM | POA: Diagnosis not present

## 2024-04-21 DIAGNOSIS — J90 Pleural effusion, not elsewhere classified: Secondary | ICD-10-CM | POA: Insufficient documentation

## 2024-04-21 DIAGNOSIS — I10 Essential (primary) hypertension: Secondary | ICD-10-CM | POA: Diagnosis not present

## 2024-04-21 DIAGNOSIS — E876 Hypokalemia: Secondary | ICD-10-CM | POA: Diagnosis not present

## 2024-04-21 DIAGNOSIS — K449 Diaphragmatic hernia without obstruction or gangrene: Secondary | ICD-10-CM | POA: Diagnosis not present

## 2024-04-21 DIAGNOSIS — Z86718 Personal history of other venous thrombosis and embolism: Secondary | ICD-10-CM | POA: Insufficient documentation

## 2024-04-21 DIAGNOSIS — Z79899 Other long term (current) drug therapy: Secondary | ICD-10-CM | POA: Diagnosis not present

## 2024-04-21 DIAGNOSIS — L97529 Non-pressure chronic ulcer of other part of left foot with unspecified severity: Secondary | ICD-10-CM | POA: Diagnosis not present

## 2024-04-21 DIAGNOSIS — R600 Localized edema: Secondary | ICD-10-CM | POA: Diagnosis present

## 2024-04-21 DIAGNOSIS — I82412 Acute embolism and thrombosis of left femoral vein: Secondary | ICD-10-CM

## 2024-04-21 DIAGNOSIS — R6889 Other general symptoms and signs: Secondary | ICD-10-CM

## 2024-04-21 DIAGNOSIS — L989 Disorder of the skin and subcutaneous tissue, unspecified: Secondary | ICD-10-CM

## 2024-04-21 LAB — IRON AND TIBC
Iron: 152 ug/dL (ref 28–170)
Saturation Ratios: 43 % — ABNORMAL HIGH (ref 10.4–31.8)
TIBC: 350 ug/dL (ref 250–450)
UIBC: 198 ug/dL

## 2024-04-21 LAB — BASIC METABOLIC PANEL WITH GFR
Anion gap: 11 (ref 5–15)
BUN: 19 mg/dL (ref 8–23)
CO2: 22 mmol/L (ref 22–32)
Calcium: 8.6 mg/dL — ABNORMAL LOW (ref 8.9–10.3)
Chloride: 104 mmol/L (ref 98–111)
Creatinine, Ser: 1.13 mg/dL — ABNORMAL HIGH (ref 0.44–1.00)
GFR, Estimated: 47 mL/min — ABNORMAL LOW (ref >60)
Glucose, Bld: 154 mg/dL — ABNORMAL HIGH (ref 70–99)
Potassium: 3.3 mmol/L — ABNORMAL LOW (ref 3.5–5.1)
Sodium: 137 mmol/L (ref 135–145)

## 2024-04-21 LAB — CBC WITH DIFFERENTIAL/PLATELET
Abs Immature Granulocytes: 0.01 K/uL (ref 0.00–0.07)
Basophils Absolute: 0.1 K/uL (ref 0.0–0.1)
Basophils Relative: 1 %
Eosinophils Absolute: 0 K/uL (ref 0.0–0.5)
Eosinophils Relative: 0 %
HCT: 23 % — ABNORMAL LOW (ref 36.0–46.0)
Hemoglobin: 6.8 g/dL — ABNORMAL LOW (ref 12.0–15.0)
Immature Granulocytes: 0 %
Lymphocytes Relative: 13 %
Lymphs Abs: 0.8 K/uL (ref 0.7–4.0)
MCH: 23.4 pg — ABNORMAL LOW (ref 26.0–34.0)
MCHC: 29.6 g/dL — ABNORMAL LOW (ref 30.0–36.0)
MCV: 79.3 fL — ABNORMAL LOW (ref 80.0–100.0)
Monocytes Absolute: 0.4 K/uL (ref 0.1–1.0)
Monocytes Relative: 6 %
Neutro Abs: 5 K/uL (ref 1.7–7.7)
Neutrophils Relative %: 80 %
Platelets: 516 K/uL — ABNORMAL HIGH (ref 150–400)
RBC: 2.9 MIL/uL — ABNORMAL LOW (ref 3.87–5.11)
RDW: 14.7 % (ref 11.5–15.5)
WBC: 6.3 K/uL (ref 4.0–10.5)
nRBC: 0 % (ref 0.0–0.2)

## 2024-04-21 LAB — PREPARE RBC (CROSSMATCH)

## 2024-04-21 LAB — ABO/RH: ABO/RH(D): A NEG

## 2024-04-21 LAB — FOLATE: Folate: 8.6 ng/mL (ref >5.9)

## 2024-04-21 LAB — PRO BRAIN NATRIURETIC PEPTIDE: Pro Brain Natriuretic Peptide: 851 pg/mL — ABNORMAL HIGH (ref <300.0)

## 2024-04-21 LAB — TROPONIN T, HIGH SENSITIVITY
Troponin T High Sensitivity: 22 ng/L — ABNORMAL HIGH (ref 0–19)
Troponin T High Sensitivity: 19 ng/L (ref 0–19)

## 2024-04-21 LAB — FERRITIN: Ferritin: 20 ng/mL (ref 11–307)

## 2024-04-21 MED ORDER — ONDANSETRON HCL 4 MG PO TABS: 4.0000 mg | ORAL_TABLET | Freq: Four times a day (QID) | ORAL | Status: DC | PRN

## 2024-04-21 MED ORDER — POTASSIUM CHLORIDE CRYS ER 20 MEQ PO TBCR
40.0000 meq | EXTENDED_RELEASE_TABLET | Freq: Once | ORAL | Status: AC
  Administered 2024-04-22: 40 meq via ORAL
  Filled 2024-04-21: qty 2

## 2024-04-21 MED ORDER — MORPHINE SULFATE (PF) 4 MG/ML IV SOLN
4.0000 mg | Freq: Once | INTRAVENOUS | Status: AC
  Administered 2024-04-21: 4 mg via INTRAVENOUS
  Filled 2024-04-21: qty 1

## 2024-04-21 MED ORDER — OXYCODONE HCL 5 MG PO TABS
5.0000 mg | ORAL_TABLET | ORAL | Status: DC | PRN
  Administered 2024-04-21: 5 mg via ORAL
  Filled 2024-04-21: qty 1

## 2024-04-21 MED ORDER — TRAZODONE HCL 50 MG PO TABS
100.0000 mg | ORAL_TABLET | Freq: Every evening | ORAL | Status: DC | PRN
  Administered 2024-04-21: 100 mg via ORAL
  Filled 2024-04-21: qty 2

## 2024-04-21 MED ORDER — PANTOPRAZOLE SODIUM 40 MG PO TBEC
40.0000 mg | DELAYED_RELEASE_TABLET | Freq: Two times a day (BID) | ORAL | Status: DC
  Administered 2024-04-21 – 2024-04-22 (×2): 40 mg via ORAL
  Filled 2024-04-21 (×2): qty 1

## 2024-04-21 MED ORDER — ACETAMINOPHEN 325 MG PO TABS: 650.0000 mg | ORAL_TABLET | Freq: Four times a day (QID) | ORAL | Status: DC | PRN

## 2024-04-21 MED ORDER — DIAZEPAM 5 MG/ML IJ SOLN
2.5000 mg | Freq: Once | INTRAMUSCULAR | Status: AC
  Administered 2024-04-21: 2.5 mg via INTRAVENOUS
  Filled 2024-04-21: qty 2

## 2024-04-21 MED ORDER — ROPINIROLE HCL 1 MG PO TABS
1.0000 mg | ORAL_TABLET | Freq: Two times a day (BID) | ORAL | Status: DC
  Administered 2024-04-21 – 2024-04-22 (×2): 1 mg via ORAL
  Filled 2024-04-21 (×3): qty 1

## 2024-04-21 MED ORDER — OXYCODONE HCL 5 MG PO TABS
5.0000 mg | ORAL_TABLET | ORAL | Status: DC | PRN
  Administered 2024-04-22 (×2): 5 mg via ORAL
  Filled 2024-04-21 (×2): qty 1

## 2024-04-21 MED ORDER — SODIUM CHLORIDE 0.9% IV SOLUTION
Freq: Once | INTRAVENOUS | Status: AC
  Filled 2024-04-21: qty 250

## 2024-04-21 MED ORDER — IOHEXOL 350 MG/ML SOLN
75.0000 mL | Freq: Once | INTRAVENOUS | Status: AC | PRN
  Administered 2024-04-21: 75 mL via INTRAVENOUS

## 2024-04-21 MED ORDER — ACETAMINOPHEN 650 MG RE SUPP: 650.0000 mg | Freq: Four times a day (QID) | RECTAL | Status: DC | PRN

## 2024-04-21 MED ORDER — ACETAMINOPHEN 500 MG PO TABS
1000.0000 mg | ORAL_TABLET | Freq: Three times a day (TID) | ORAL | Status: DC | PRN
  Administered 2024-04-22: 1000 mg via ORAL
  Filled 2024-04-21: qty 2

## 2024-04-21 MED ORDER — ONDANSETRON HCL 4 MG/2ML IJ SOLN
4.0000 mg | Freq: Once | INTRAMUSCULAR | Status: AC
  Administered 2024-04-21: 4 mg via INTRAVENOUS
  Filled 2024-04-21: qty 2

## 2024-04-21 MED ORDER — MORPHINE SULFATE (PF) 2 MG/ML IV SOLN: 2.0000 mg | INTRAVENOUS | Status: DC | PRN

## 2024-04-21 MED ORDER — HYDROMORPHONE HCL 1 MG/ML IJ SOLN
0.5000 mg | Freq: Once | INTRAMUSCULAR | Status: AC
  Administered 2024-04-21: 0.5 mg via INTRAVENOUS
  Filled 2024-04-21: qty 0.5

## 2024-04-21 MED ORDER — GABAPENTIN 300 MG PO CAPS
300.0000 mg | ORAL_CAPSULE | Freq: Every day | ORAL | Status: DC
  Administered 2024-04-21: 300 mg via ORAL
  Filled 2024-04-21: qty 1

## 2024-04-21 MED ORDER — ONDANSETRON HCL 4 MG/2ML IJ SOLN: 4.0000 mg | Freq: Four times a day (QID) | INTRAMUSCULAR | Status: DC | PRN

## 2024-04-21 MED ORDER — ENOXAPARIN SODIUM 40 MG/0.4ML IJ SOSY
40.0000 mg | PREFILLED_SYRINGE | INTRAMUSCULAR | Status: DC
  Administered 2024-04-21: 40 mg via SUBCUTANEOUS
  Filled 2024-04-21: qty 0.4

## 2024-04-21 NOTE — ED Triage Notes (Signed)
 Pt presents to the ED via POV from home with daughter. Pt reports lew pain x4 weeks. Pt has been seen at PCP for same and was sent to the dermatologist for same. Legs were wrapped at dermatology. Pt denies CP or SHOB. Swelling and wounds noted.

## 2024-04-21 NOTE — H&P (Signed)
 History and Physical    Patricia Knox FMW:[id number] DOB: [birth year] DOA: 04/21/2024  PCP: Fernand Fredy RAMAN, MD  Patient coming from: home  I have personally briefly reviewed patient's old medical records in [facility]  Chief Complaint: leg pain  HPI: Patricia Knox is a 88 y.o. female with medical history significant of DVT and venous stasis who presented with leg pain and swelling.    Hx provided by pt and her daughter.  Pt reported bilateral leg swelling and pain for weeks.  Pt had seen dermatologist who applied compression wrap which pt could not tolerate.  Pt was then referred to wound clinic, but because the appointment is 2 weeks away, daughter didn't want to wait and brought pt to the ED.  Pt reported the pain is mostly where the ulcers are located.  Pt is still able to walk and works.  Pt also has dyspnea on minimum exertion, though no hx of COPD.  Pt denied black stools or signs of bleeding.    ED Course: initial vitals: afebrile, pulse 80, BP 123/73, RR 18, sating 99% on room air.  Labs notable for Hgb 6.8.  CTA chest no acute finding.  US  neg for LE DVT.  Pt received 1u pRBC in the ED.    Assessment/Plan  # Bilateral LE swelling # Venous stasis ulcers --significant pain associated with leg ulcers.  Pt could not tolerate compression wrap. --symmetrical erythema, does not appear infected.  Neg for DVT. --no need for abx --wound care consult --Oxycodone  PRN  # Anemia --Hgb 6.8 on presentation.  Hgb 11.1 back in April 2025.  Pt reported no signs of bleeding. --s/p 1u pRBC in the ED. --monitor Hgb --anemia workup  # Hx of DVT --hold Eliquis  for now due to Hgb drop  # Restless leg --cont home Requip   # Hypokalemia --monitor and supplement PRN   DVT prophylaxis: Lovenox SQ Code Status: Full code  Family Communication: daughter updated at bedside on admission  Disposition Plan: home  Consults called: wound care Level of care: Med-Surg   Review of Systems:  As per HPI otherwise complete review of systems negative.   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   COLONOSCOPY  2002   TOE SURGERY  1999     reports that she has never smoked. She has never used smokeless tobacco. She reports that she does not drink alcohol and does not use drugs.  No Known Allergies  Family History  Problem Relation Age of Onset  Stroke Mother    Heart attack Father    Breast cancer Neg Hx      Prior to Admission medications   Medication Sig Start Date End Date Taking? Authorizing Provider  alendronate (FOSAMAX) 70 MG tablet Take 1 tablet by mouth once a week 08/14/23  Yes Fernand Fredy RAMAN, MD  amLODipine  (NORVASC ) 5 MG tablet Take 1 tablet by mouth once daily 02/11/24  Yes Fernand Fredy RAMAN, MD  apixaban  (ELIQUIS ) 5 MG TABS tablet Take 2 tablets (10mg ) twice daily for 7 days, then 1 tablet (5mg ) twice daily Patient taking differently: Take 5 mg by mouth 2 (two) times daily. 08/20/23  Yes Fernand Fredy RAMAN, MD  aspirin  EC 81 MG tablet Take 1 tablet (81 mg total) by mouth daily. Swallow whole.  07/25/23  Yes Patel, Sona, MD  atorvastatin  (LIPITOR) 40 MG tablet TAKE 1 TABLET BY MOUTH ONCE DAILY AT BEDTIME 06/29/23  Yes Fernand Fredy RAMAN, MD  Calcium  Carbonate-Vitamin D (CALTRATE 600+D PO) Take 1 capsule by mouth 2 (two) times daily.   Yes [provider]  doxycycline (VIBRAMYCIN) 100 MG capsule Take 100 mg by mouth 2 (two) times daily. 04/14/24  Yes [provider]  furosemide  (LASIX ) 20 MG tablet Take 1 tablet (20 mg total) by mouth daily. 08/06/23 08/05/24 Yes Fernand Fredy RAMAN, MD  gabapentin  (NEURONTIN ) 100 MG capsule TAKE 3 CAPSULES BY MOUTH AT BEDTIME 04/18/24  Yes Yan, Yijun, MD  hydrocortisone 2.5 % cream Apply 1 Application topically 2 (two) times a week. 03/29/24  Yes [provider]  ketoconazole (NIZORAL) 2 % cream Apply 1 Application topically 2 (two) times daily. 03/29/24  Yes [provider]  omeprazole (PRILOSEC) 20 MG capsule Take 1 capsule by mouth twice daily 10/19/23  Yes Fernand Fredy RAMAN, MD  rOPINIRole  (REQUIP ) 1 MG tablet Take 1 tablet by mouth twice daily 02/08/24  Yes Fernand Fredy RAMAN, MD  traZODone  (DESYREL ) 100 MG tablet TAKE 1 TABLET BY MOUTH AT BEDTIME AS NEEDED FOR INSOMNIA 02/08/24  Yes Fernand Fredy RAMAN, MD  triamcinolone  (KENALOG ) 0.025 % cream Apply 1 Application topically daily. 11/05/23  Yes [provider]  spironolactone  (ALDACTONE ) 25 MG tablet Take 1 tablet by mouth once daily 02/15/23 02/15/24  Fernand Fredy RAMAN, MD    Physical Exam: Vitals:   04/21/24 1315 04/21/24 1328 04/21/24 1355 04/21/24 1558  BP:  125/69 129/71 (!) 147/77  Pulse: 65 68 70 71  Resp: 18 19 19 14   Temp:  98.2 F (36.8 C) 98.4 F (36.9 C) 97.7 F (36.5 C)  TempSrc:  Oral Oral Oral  SpO2: 97% 99%    Weight:      Height:        Constitutional: NAD, AAOx3 HEENT: conjunctivae and lids normal, EOMI CV: No cyanosis.   RESP: normal respiratory effort Extremities: edema in BLE, with venous stasis changes and ulcers SKIN: warm, dry Neuro: II - XII grossly  intact.   Psych: Normal mood and affect.  Appropriate judgement and reason   Labs on Admission: I have personally reviewed labs and imaging studies  Time spent: 60 minutes  Ellouise Haber MD [facility]  If 7PM-7AM, please contact night-coverage 04/21/2024, 5:20 PM

## 2024-04-21 NOTE — ED Provider Notes (Signed)
    CT Angio Chest PE W and/or Wo Contrast Result Date: 04/21/2024 CLINICAL DATA:  Shortness of breath.  History of PE. EXAM: CT ANGIOGRAPHY CHEST WITH CONTRAST TECHNIQUE: Multidetector CT imaging of the chest was performed using the standard protocol during bolus administration of intravenous contrast. Multiplanar CT image reconstructions and MIPs were obtained to evaluate the vascular anatomy. RADIATION DOSE REDUCTION: This exam was performed according to the departmental dose-optimization program which includes automated exposure control, adjustment of the mA and/or kV according to patient size and/or use of iterative reconstruction technique. CONTRAST:  75mL OMNIPAQUE  IOHEXOL  350 MG/ML SOLN COMPARISON:  CT angiogram chest 07/23/2023 FINDINGS: Cardiovascular: There is adequate opacification of the pulmonary arteries to the segmental level. There is no evidence for pulmonary embolism. The ascending aorta is dilated measuring 4.1 cm. Heart is mildly enlarged. There are atherosclerotic calcifications of the aorta. There is no pericardial effusion. Mediastinum/Nodes: Bilateral thyroid nodules are again seen. The largest is left measuring 15 mm, unchanged. There some mild tracheal deviation to the right secondary to enlarged aorta is similar to prior. No enlarged lymph nodes are seen. There is a very large hiatal hernia with intrathoracic stomach is similar to prior. The stomach is nondilated Lungs/Pleura: There is a very small left pleural effusion which is new. Right pleural effusion has resolved. There is atelectasis in the bilateral lower lobes. There some scattered peripheral scarring in both lungs. No pneumothorax. Upper Abdomen: There is a 2.3 cm cyst or hemangioma in the left lobe of the liver. There is a 14 mm cyst in the superior pole the left kidney. Musculoskeletal: Degenerative changes affect the spine. Review of the MIP images confirms the above findings. IMPRESSION: 1. No evidence for pulmonary  embolism. 2. New very small left pleural effusion. 3. Stable very large hiatal hernia with intrathoracic stomach. 4. Aortic atherosclerosis. Aortic Atherosclerosis (ICD10-I70.0). Electronically Signed   By: Greig Pique M.D.   On: 04/21/2024 16:18      Leg wounds, on doxycycline, but having notable pain from leg wounds. None appear infected per Dr. Nicholaus at this time. All extremities with good palpable distal pulses.   Patient is anemic. Receiving prbc transfusion.  Will need admit, but wish to exclude PE prior to admit.    ----------------------------------------- 5:08 PM on 04/21/2024 -----------------------------------------  Consulted with hospitalist, patient accepted for admission by Dr. ONEIDA Awanda Dicky Oneil, MD 04/21/24 [phone]

## 2024-04-21 NOTE — ED Notes (Signed)
 This RN to bedside, pt transferred herself from the wheelchair to bed, pt appears to be labored to breathe, this RN asked the pt if she always breathed this way, pt states that she hadn't noticed anything about her breathing that she is here to be seen for her leg pain, daughter states that she has been getting more out of breath with daily activities lately. Pt doesn't acknowledge that she feels sob, pt has been seen by her pcp and dermatologist for the leg wounds, pt denies being told that she has vascular insufficiency but does report that she has a hx of blood clots that she takes a blood thinner for, pt denies seeing dark or bloody stools. Pt has wounds to bilat lower extremities, worse on the back of her legs near her ankles, some oozing noted with yellow colored exudate on the wound bed. Pt is frequently moving her legs in attempt to bring relief, bilat pedal pulses palpated and both are bounding

## 2024-04-21 NOTE — ED Notes (Signed)
 Pt taken to xray via stretcher

## 2024-04-21 NOTE — ED Provider Notes (Signed)
 [facility] Provider Note    Event Date/Time   First MD Initiated Contact with Patient 04/21/24 1105     (approximate)   History   Leg Pain   HPI  Patricia Knox is a 88 y.o. female  with medical history significant of remote history of PE, HTN, HLD, breast cancer status post lumpectomy, restless leg syndrome, presented with worsening bilateral leg pain for several months and 1 week of progressively worsening shortness of breath.  Patient reports that she has wounds over the legs that she has been followed by vascular surgery and also a dermatologist [facility]) and was placed in [employer] recently but self removed them and was subsequently told to use stockings.  For the past week she has had worsening shortness of breath but denies any dark or bloody bowel movements or abdominal pain.  She has been on longstanding doxycycline prescribed by her primary care physician.  She presents with her daughter who helps contribute to the history.  Patient denies missing any of her Eliquis .  She reports no fevers chills, congestion or chest pain.      Physical Exam   Triage Vital Signs: ED Triage Vitals  Encounter Vitals Group     BP 04/21/24 1029 123/73     Girls Systolic BP Percentile --      Girls Diastolic BP Percentile --      Boys Systolic BP Percentile --      Boys Diastolic BP Percentile --      Pulse Rate 04/21/24 1029 80     Resp 04/21/24 1029 18     Temp 04/21/24 1029 97.8 F (36.6 C)     Temp Source 04/21/24 1029 Oral     SpO2 04/21/24 1029 99 %     Weight 04/21/24 1030 145 lb (65.8 kg)     Height 04/21/24 1030 5' 2 (1.575 m)     Head Circumference --      Peak Flow --      Pain Score 04/21/24 1029 5     Pain Loc --      Pain Education --      Exclude from Growth Chart --     Most recent vital signs: Vitals:   04/21/24 1355 04/21/24 1558  BP: 129/71 (!) 147/77  Pulse: 70 71  Resp: 19 14  Temp: 98.4 F (36.9 C) 97.7 F (36.5 C)   SpO2:      Nursing Triage Note reviewed. Vital signs reviewed and patients oxygen saturation is normoxic  General: Patient is well nourished, well developed, awake and alert, appears uncomfortable Head: Normocephalic and atraumatic Eyes: Normal inspection, extraocular muscles intact, no conjunctival pallor Ear, nose, throat: Normal external exam Neck: Normal range of motion Respiratory: Patient is in no respiratory distress, lungs CTAB Cardiovascular: Patient is not tachycardic, RRR without murmur appreciated GI: Abd SNT with no guarding or rebound  Back: Normal inspection of the back with good strength and range of motion throughout all ext Extremities: Patient has good cap refill in bilateral toes, she does have 1+ DP pulses bilaterally     Neuro: The patient is alert and oriented to person, place, and time, appropriately conversive, with 5/5 bilat UE/LE strength, no gross motor or sensory defects noted. Coordination appears to be adequate. Skin: appears pale Psych: anxious mood and affect, no SI or HI  ED Results / Procedures / Treatments   Labs (all labs ordered are listed, but only abnormal results are displayed)  Labs Reviewed  CBC WITH DIFFERENTIAL/PLATELET - Abnormal; Notable for the following components:      Result Value   RBC 2.90 (*)    Hemoglobin 6.8 (*)    HCT 23.0 (*)    MCV 79.3 (*)    MCH 23.4 (*)    MCHC 29.6 (*)    Platelets 516 (*)    All other components within normal limits  BASIC METABOLIC PANEL WITH GFR - Abnormal; Notable for the following components:   Potassium 3.3 (*)    Glucose, Bld 154 (*)    Creatinine, Ser 1.13 (*)    Calcium  8.6 (*)    GFR, Estimated 47 (*)    All other components within normal limits  PRO BRAIN NATRIURETIC PEPTIDE - Abnormal; Notable for the following components:   Pro Brain Natriuretic Peptide 851.0 (*)    All other components within normal limits  TROPONIN T, HIGH SENSITIVITY - Abnormal; Notable for the following  components:   Troponin T High Sensitivity 22 (*)    All other components within normal limits  TYPE AND SCREEN  PREPARE RBC (CROSSMATCH)  ABO/RH  TROPONIN T, HIGH SENSITIVITY     EKG EKG and rhythm strip are interpreted by myself:   EKG: [Normal sinus rhythm] at heart rate of 67, normal QRS duration, QTc 452, nonspecific ST segments and T waves no ectopy EKG not consistent with Acute STEMI Rhythm strip: NSR in lead II   RADIOLOGY Xray tib/fib: No acute abnormality on my independent review interpretation radiologist agrees X-ray chest: No acute abnormality on my independent review interpretation radiologist agrees but also reports stable cardiomyopathy and hiatal hernia Ultrasound venous: Pending CT PE: Pending    PROCEDURES:  Critical Care performed: No  Procedures   MEDICATIONS ORDERED IN ED: Medications  0.9 %  sodium chloride  infusion (Manually program via Guardrails IV Fluids) (0 mLs Intravenous Stopped 04/21/24 1605)  ondansetron  (ZOFRAN ) injection 4 mg (4 mg Intravenous Given 04/21/24 1146)  morphine  (PF) 4 MG/ML injection 4 mg (4 mg Intravenous Given 04/21/24 1146)  diazepam  (VALIUM ) injection 2.5 mg (2.5 mg Intravenous Given 04/21/24 1240)  HYDROmorphone  (DILAUDID ) injection 0.5 mg (0.5 mg Intravenous Given 04/21/24 1437)  iohexol  (OMNIPAQUE ) 350 MG/ML injection 75 mL (75 mLs Intravenous Contrast Given 04/21/24 1540)     IMPRESSION / MDM / ASSESSMENT AND PLAN / ED COURSE                                Differential diagnosis includes, but is not limited to, VTE, acute anemia, arrhythmia, electrolyte derangement   ED course: Patient presents without any focal neurological deficits and on presentation extremities do not appear infected/consistent with cellulitis at this time however she does have wounds and patient does appear in pain.  She was given morphine  which worsened her symptoms.  Subsequently given a dose of IM with some improvement.  Doppler  ultrasounds for DVT were not consistent with DVT.  I do not think patient's presentation is consistent with acute ischemic legs given bilateral nature, good cap refill. Although patient primarily presented for leg pain, patient was found to be short of breath on review of symptoms and does report 1 month of such.  She was anemic but denies any stool changes.  I reviewed the indications benefits risk alternatives of initiating a blood transfusion and she voiced understanding and opted to proceed.  Surprisingly patient's BNP is significantly elevated as well.  Consequently  I will obtain a CT PE to ensure no pulmonary embolism (albeit this would likely be a failure of Eliquis ).  Anticipate patient will require admission today given inability to get her pain under control.  Patient was signed out to oncoming physician at 4:15 PM pending CT PE read:   Clinical Course as of 04/21/24 1650  Thu Apr 21, 2024  1128 Hemoglobin(!): 6.8 Anemic [HD]  1232 Pro Brain Natriuretic Peptide(!): 851.0 [HD]  1232 Elevated [HD]  1236 I reevaluated the patient.  She states that the morphine did not help at all and her legs are still worsening.  She is satting now 90% on room air.  I will order her a small dose of Valium but given her past history and the elevation of BNP.  Will order a CT PE as well [HD]  1415 Patient still endorsing intractable lower extremity pain.  Although she is requiring oxygen, will try some Dilaudid [HD]  1415 US  Venous Img Lower Bilateral No DVT, consistent with Baker's cyst bilaterally [HD]    Clinical Course User Index [HD] Nicholaus Rolland BRAVO, MD   -- Risk: 5 This patient has a high risk of morbidity due to further diagnostic testing or treatment. Rationale: This patient's evaluation and management involve a high risk of morbidity due to the potential severity of presenting symptoms, need for diagnostic testing, and/or initiation of treatment that may require close monitoring. The differential  includes conditions with potential for significant deterioration or requiring escalation of care. Treatment decisions in the ED, including medication administration, procedural interventions, or disposition planning, reflect this level of risk. COPA: 5 The patient has the following acute or chronic illness/injury that poses a possible threat to life or bodily function: [X] : The patient has a potentially serious acute condition or an acute exacerbation of a chronic illness requiring urgent evaluation and management in the Emergency Department. The clinical presentation necessitates immediate consideration of life-threatening or function-threatening diagnoses, even if they are ultimately ruled out.   FINAL CLINICAL IMPRESSION(S) / ED DIAGNOSES   Final diagnoses:  Acute anemia  Shortness of breath  Leg lesion  Intractable pain     Rx / DC Orders   ED Discharge Orders     None        Note:  This document was prepared using Dragon voice recognition software and may include unintentional dictation errors.   Nicholaus Rolland BRAVO, MD 04/21/24 [phone]

## 2024-04-22 ENCOUNTER — Observation Stay

## 2024-04-22 DIAGNOSIS — D649 Anemia, unspecified: Secondary | ICD-10-CM | POA: Diagnosis not present

## 2024-04-22 LAB — BPAM RBC
Blood Product Expiration Date: 202511292359
ISSUE DATE / TIME: 202511201323
Unit Type and Rh: 600

## 2024-04-22 LAB — CBC
HCT: 24.7 % — ABNORMAL LOW (ref 36.0–46.0)
Hemoglobin: 7.7 g/dL — ABNORMAL LOW (ref 12.0–15.0)
MCH: 24.5 pg — ABNORMAL LOW (ref 26.0–34.0)
MCHC: 31.2 g/dL (ref 30.0–36.0)
MCV: 78.7 fL — ABNORMAL LOW (ref 80.0–100.0)
Platelets: 462 K/uL — ABNORMAL HIGH (ref 150–400)
RBC: 3.14 MIL/uL — ABNORMAL LOW (ref 3.87–5.11)
RDW: 14.5 % (ref 11.5–15.5)
WBC: 6.1 K/uL (ref 4.0–10.5)
nRBC: 0 % (ref 0.0–0.2)

## 2024-04-22 LAB — TYPE AND SCREEN
ABO/RH(D): A NEG
Antibody Screen: NEGATIVE
Unit division: 0

## 2024-04-22 LAB — BASIC METABOLIC PANEL WITH GFR
Anion gap: 8 (ref 5–15)
BUN: 14 mg/dL (ref 8–23)
CO2: 24 mmol/L (ref 22–32)
Calcium: 8.3 mg/dL — ABNORMAL LOW (ref 8.9–10.3)
Chloride: 104 mmol/L (ref 98–111)
Creatinine, Ser: 0.93 mg/dL (ref 0.44–1.00)
GFR, Estimated: 60 mL/min — ABNORMAL LOW (ref >60)
Glucose, Bld: 80 mg/dL (ref 70–99)
Potassium: 3.9 mmol/L (ref 3.5–5.1)
Sodium: 136 mmol/L (ref 135–145)

## 2024-04-22 LAB — VITAMIN B12: Vitamin B-12: 320 pg/mL (ref 180–914)

## 2024-04-22 LAB — MAGNESIUM: Magnesium: 1.8 mg/dL (ref 1.7–2.4)

## 2024-04-22 MED ORDER — ACETAMINOPHEN 500 MG PO TABS: 1000.0000 mg | ORAL_TABLET | Freq: Three times a day (TID) | ORAL | Status: AC | PRN

## 2024-04-22 MED ORDER — OXYCODONE HCL 5 MG PO TABS: 5.0000 mg | ORAL_TABLET | Freq: Four times a day (QID) | ORAL | 0 refills | Status: AC | PRN

## 2024-04-22 MED ORDER — APIXABAN 5 MG PO TABS: 5.0000 mg | ORAL_TABLET | Freq: Two times a day (BID) | ORAL | Status: AC

## 2024-04-22 NOTE — Discharge Summary (Signed)
 Physician Discharge Summary   Patricia Knox  female DOB: Apr 04, 1936  FMW:[id number]  PCP: Fernand Fredy RAMAN, MD  Admit date: 04/21/2024 Discharge date: 04/22/2024  Admitted From: home Disposition:  home Daughter updated at bedside prior to discharge. CODE STATUS: Full code  Discharge Instructions     Ambulatory referral to Gastroenterology   Complete by: As directed    Hgb drop from normal to 6's in 7 months.  No obvious bleeding.   What is the reason for referral?: Colonoscopy   Ambulatory referral to Vascular Surgery   Complete by: As directed    Bilateral leg pain and venous stasis ulcers, with ABI showing Elevated ankle-brachial indices bilaterally. This is likely secondary to incompressible vessels.   Diet - low sodium heart healthy   Complete by: As directed    Discharge instructions   Complete by: As directed    Please continue the wound care and dressing change as directed.  The blood flow is abnormal in your legs.  I have referred you to see vascular surgery.  I have stopped your amlodipine  since sometimes that can cause leg swelling.  Your red blood cell count dropped by half in about 7 months.  I have referred you to see GI doctor for further workup. - -   Discharge wound care:   Complete by: As directed    Bilatreal legs: Cleanse with Vashe #[id number], not rinse, pat dry. Apply Xeroform to the wound bed, top with Foam dressing or ABD pad. Wrap both legs with Kerlix and Coban, from the base of toes to below the distal patellar notch, including the heels. Overlap the Coban strip in 50%, apply compression starting at the ankle. Change daily. [facility] Course:  For full details, please see H&P, progress notes, consult notes and ancillary notes.  Briefly,  Patricia Knox is a 88 y.o. female with medical history significant of DVT and venous stasis who presented with leg pain and swelling.    # Bilateral LE swelling # Venous stasis  ulcers --significant pain associated with leg ulcers.  Pt could not tolerate compression wrap performed at outpatient dermatology clinic (sounded like Unna boots). --symmetrical erythema, does not appear infected.  Neg for DVT. --no need for abx --wound care consulted and rec for wound care and wrap as above --oxycodone  5 mg x10 tabs prescribed for home use due to significant pain. --Pt is already in the process of being set up with outpatient wound care center.  # Possible PAD --ABI performed, found Elevated ankle-brachial indices bilaterally. This is likely secondary to incompressible vessels.   --Pt doesn't want to stay for inpatient workup, so was referred to vascular surgery as outpatient.   # Anemia --Hgb 6.8 on presentation.  Hgb 11.1 back in April 2025.  Pt reported no signs of bleeding. --s/p 1u pRBC in the ED. --monitor Hgb --anemia workup showed no significant def.   --refer to outpatient GI for further workup.  # Hx of DVT --cont home Eliquis    # Restless leg --cont home Requip    # Hypokalemia --monitored and supplemented PRN  # HTN --home amlodipine  d/c'ed due to possible contribution to leg swelling --cont home lasix  and aldactone    Discharge Diagnoses:  Principal Problem:   Anemia     Discharge Instructions:  Allergies as of 04/22/2024   No Known Allergies      Medication List     STOP taking these medications    amLODipine  5  MG tablet Commonly known as: NORVASC    doxycycline 100 MG capsule Commonly known as: VIBRAMYCIN       TAKE these medications    acetaminophen  500 MG tablet Commonly known as: TYLENOL  Take 2 tablets (1,000 mg total) by mouth 3 (three) times daily as needed for mild pain (pain score 1-3), moderate pain (pain score 4-6) or headache.   alendronate 70 MG tablet Commonly known as: FOSAMAX Take 1 tablet by mouth once a week   apixaban  5 MG Tabs tablet Commonly known as: Eliquis  Take 1 tablet (5 mg total) by mouth  2 (two) times daily. Home med. What changed:  how much to take how to take this when to take this additional instructions   aspirin  EC 81 MG tablet Take 1 tablet (81 mg total) by mouth daily. Swallow whole.   atorvastatin  40 MG tablet Commonly known as: LIPITOR TAKE 1 TABLET BY MOUTH ONCE DAILY AT BEDTIME   CALTRATE 600+D PO Take 1 capsule by mouth 2 (two) times daily.   furosemide  20 MG tablet Commonly known as: Lasix  Take 1 tablet (20 mg total) by mouth daily.   gabapentin  100 MG capsule Commonly known as: NEURONTIN  TAKE 3 CAPSULES BY MOUTH AT BEDTIME   hydrocortisone 2.5 % cream Apply 1 Application topically 2 (two) times a week.   ketoconazole 2 % cream Commonly known as: NIZORAL Apply 1 Application topically 2 (two) times daily.   omeprazole 20 MG capsule Commonly known as: PRILOSEC Take 1 capsule by mouth twice daily   oxyCODONE  5 MG immediate release tablet Commonly known as: Oxy IR/ROXICODONE  Take 1 tablet (5 mg total) by mouth every 6 (six) hours as needed for severe pain (pain score 7-10).   rOPINIRole  1 MG tablet Commonly known as: REQUIP  Take 1 tablet by mouth twice daily   spironolactone  25 MG tablet Commonly known as: ALDACTONE  Take 1 tablet by mouth once daily   traZODone  100 MG tablet Commonly known as: DESYREL  TAKE 1 TABLET BY MOUTH AT BEDTIME AS NEEDED FOR INSOMNIA   triamcinolone  0.025 % cream Commonly known as: KENALOG  Apply 1 Application topically daily.               Discharge Care Instructions  (From admission, onward)           Start     Ordered   04/22/24 0000  Discharge wound care:       Comments: Bilatreal legs: Cleanse with Vashe #[id number], not rinse, pat dry. Apply Xeroform to the wound bed, top with Foam dressing or ABD pad. Wrap both legs with Kerlix and Coban, from the base of toes to below the distal patellar notch, including the heels. Overlap the Coban strip in 50%, apply compression starting at the  ankle. Change daily. - -   04/22/24 1747             Follow-up Information     Fernand Fredy RAMAN, MD Follow up.   Specialty: Internal Medicine Why: hospital follow up Contact information: [address] [place] [address] [phone]         Therisa Bi, MD Follow up.   Specialty: Gastroenterology Contact information: [address] [place] [address] [phone]         Marea Selinda RAMAN, MD Follow up.   Specialties: Vascular Surgery, Radiology, Interventional Cardiology Contact information: [address] [place] [address] [phone]  No Known Allergies   The results of significant diagnostics from this hospitalization (including imaging, microbiology, ancillary and laboratory) are listed below for reference.   Consultations:   Procedures/Studies: US  ARTERIAL ABI (SCREENING LOWER EXTREMITY) Result Date: 04/22/2024 CLINICAL DATA:  Bilateral leg pain. EXAM: NONINVASIVE PHYSIOLOGIC VASCULAR STUDY OF BILATERAL LOWER EXTREMITIES TECHNIQUE: Evaluation of both lower extremities were performed at rest, including calculation of ankle-brachial indices with single level Doppler, pressure and pulse volume recording. COMPARISON:  None Available. FINDINGS: Right ABI:  1.83 Left ABI:  1.83 Right Lower Extremity: Irregular waveforms but probable biphasic waveforms in the dorsalis pedis artery. Left Lower Extremity: Irregular waveforms with some biphasic waveforms. 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 This is likely secondary to incompressible vessels. Electronically Signed   By: Juliene Balder M.D.   On: 04/22/2024 16:27   CT Angio Chest PE W and/or Wo Contrast Result Date: 04/21/2024 CLINICAL DATA:  Shortness of breath.  History of PE. EXAM: CT ANGIOGRAPHY CHEST WITH CONTRAST TECHNIQUE:  Multidetector CT imaging of the chest was performed using the standard protocol during bolus administration of intravenous contrast. Multiplanar CT image reconstructions and MIPs were obtained to evaluate the vascular anatomy. RADIATION DOSE REDUCTION: This exam was performed according to the departmental dose-optimization program which includes automated exposure control, adjustment of the mA and/or kV according to patient size and/or use of iterative reconstruction technique. CONTRAST:  75mL OMNIPAQUE  IOHEXOL  350 MG/ML SOLN COMPARISON:  CT angiogram chest 07/23/2023 FINDINGS: Cardiovascular: There is adequate opacification of the pulmonary arteries to the segmental level. There is no evidence for pulmonary embolism. The ascending aorta is dilated measuring 4.1 cm. Heart is mildly enlarged. There are atherosclerotic calcifications of the aorta. There is no pericardial effusion. Mediastinum/Nodes: Bilateral thyroid nodules are again seen. The largest is left measuring 15 mm, unchanged. There some mild tracheal deviation to the right secondary to enlarged aorta is similar to prior. No enlarged lymph nodes are seen. There is a very large hiatal hernia with intrathoracic stomach is similar to prior. The stomach is nondilated Lungs/Pleura: There is a very small left pleural effusion which is new. Right pleural effusion has resolved. There is atelectasis in the bilateral lower lobes. There some scattered peripheral scarring in both lungs. No pneumothorax. Upper Abdomen: There is a 2.3 cm cyst or hemangioma in the left lobe of the liver. There is a 14 mm cyst in the superior pole the left kidney. Musculoskeletal: Degenerative changes affect the spine. Review of the MIP images confirms the above findings. IMPRESSION: 1. No evidence for pulmonary embolism. 2. New very small left pleural effusion. 3. Stable very large hiatal hernia with intrathoracic stomach. 4. Aortic atherosclerosis. Aortic Atherosclerosis (ICD10-I70.0).  Electronically Signed   By: Greig Pique M.D.   On: 04/21/2024 16:18   US  Venous Img Lower Bilateral Result Date: 04/21/2024 CLINICAL DATA:  BILATERAL lower extremity pain, greater on the RIGHT EXAM: Bilateral Lower Extremity Venous Doppler Ultrasound TECHNIQUE: Gray-scale sonography with compression, as well as color and duplex ultrasound, were performed to evaluate the deep venous system(s) from the level of the common femoral vein through the popliteal and proximal calf veins. COMPARISON:  07/23/2023 FINDINGS: VENOUS Normal compressibility of the common femoral, superficial femoral, and popliteal veins, as well as the visualized calf veins. Visualized portions of profunda femoral vein and great saphenous vein unremarkable. No filling defects to suggest DVT on grayscale or color Doppler imaging. Doppler waveforms show  normal direction of venous flow, normal respiratory plasticity and response to augmentation. OTHER None. Limitations: none IMPRESSION: 1. No lower extremity DVT. 2. 3.3 x 1.1 x 2.8 cm fluid collection in the RIGHT popliteal fossa is likely a Baker's cyst. 3. 2.7 x 1.0 x 2.6 cm fluid collection in the LEFT popliteal fossa is likely a Baker's cyst. Electronically Signed   By: Aliene  Mir M.D.   On: 04/21/2024 14:06   DG Tibia/Fibula Right Result Date: 04/21/2024 EXAM: 2 VIEWS XRAY OF THE RIGHT TIBIA AND FIBULA 04/21/2024 12:15:13 PM COMPARISON: None available. CLINICAL HISTORY: Evaluate for gas tracking, pain in leg. FINDINGS: BONES AND JOINTS: No acute fracture. No focal osseous lesion. No joint dislocation. SOFT TISSUES: The soft tissues are unremarkable. IMPRESSION: Electronically signed by: Lynwood Seip MD 04/21/2024 01:11 PM EST RP Workstation: [id number]   DG Chest 2 View Result Date: 04/21/2024 EXAM: 2 VIEW(S) XRAY OF THE CHEST 04/21/2024 12:15:13 PM COMPARISON: Prior study from 07/23/2023. CLINICAL HISTORY: shortness of breath FINDINGS: LUNGS AND PLEURA: No focal pulmonary opacity.  No pleural effusion. No pneumothorax. HEART AND MEDIASTINUM: Stable cardiomegaly. BONES AND SOFT TISSUES: Large hiatal hernia is noted. No acute osseous abnormality. IMPRESSION: 1. Stable cardiomegaly. 2. Large hiatal hernia. Electronically signed by: Lynwood Seip MD 04/21/2024 01:10 PM EST RP Workstation: [id number]      Labs: BNP (last 3 results) No results for input(s): BNP in the last 8760 hours. Basic Metabolic Panel: Recent Labs  Lab 04/21/24 1033 04/22/24 0404  NA 137 136  K 3.3* 3.9  CL 104 104  CO2 22 24  GLUCOSE 154* 80  BUN 19 14  CREATININE 1.13* 0.93  CALCIUM  8.6* 8.3*  MG  --  1.8   Liver Function Tests: No results for input(s): AST, ALT, ALKPHOS, BILITOT, PROT, ALBUMIN in the last 168 hours. No results for input(s): LIPASE, AMYLASE in the last 168 hours. No results for input(s): AMMONIA in the last 168 hours. CBC: Recent Labs  Lab 04/21/24 1033 04/22/24 0404  WBC 6.3 6.1  NEUTROABS 5.0  --   HGB 6.8* 7.7*  HCT 23.0* 24.7*  MCV 79.3* 78.7*  PLT 516* 462*   Cardiac Enzymes: No results for input(s): CKTOTAL, CKMB, CKMBINDEX, TROPONINI in the last 168 hours. BNP: Invalid input(s): POCBNP CBG: No results for input(s): GLUCAP in the last 168 hours. D-Dimer No results for input(s): DDIMER in the last 72 hours. Hgb A1c No results for input(s): HGBA1C in the last 72 hours. Lipid Profile No results for input(s): CHOL, HDL, LDLCALC, TRIG, CHOLHDL, LDLDIRECT in the last 72 hours. Thyroid function studies No results for input(s): TSH, T4TOTAL, T3FREE, THYROIDAB in the last 72 hours.  Invalid input(s): FREET3 Anemia work up Recent Labs    04/21/24 1938  VITAMINB12 320  FOLATE 8.6  FERRITIN 20  TIBC 350  IRON 152   Urinalysis    Component Value Date/Time   COLORURINE YELLOW (A) 07/23/2023 1025   APPEARANCEUR HAZY (A) 07/23/2023 1025   LABSPEC 1.011 07/23/2023 1025   PHURINE 7.0 07/23/2023  1025   GLUCOSEU NEGATIVE 07/23/2023 1025   HGBUR NEGATIVE 07/23/2023 1025   BILIRUBINUR NEGATIVE 07/23/2023 1025   KETONESUR 5 (A) 07/23/2023 1025   PROTEINUR NEGATIVE 07/23/2023 1025   NITRITE NEGATIVE 07/23/2023 1025   LEUKOCYTESUR LARGE (A) 07/23/2023 1025   Sepsis Labs Recent Labs  Lab 04/21/24 1033 04/22/24 0404  WBC 6.3 6.1   Microbiology No results found for this or any previous visit (from the past 240 hours).  Total time spend on discharging this patient, including the last patient exam, discussing the hospital stay, instructions for ongoing care as it relates to all pertinent caregivers, as well as preparing the medical discharge records, prescriptions, and/or referrals as applicable, is 45 minutes.    Ellouise Haber, MD  [facility] 04/22/2024, 5:49 PM

## 2024-04-22 NOTE — Care Management Obs Status (Signed)
 MEDICARE OBSERVATION STATUS NOTIFICATION   Patient Details  Name: Patricia Knox MRN: [id number] Date of Birth: Dec 04, 1935   Medicare Observation Status Notification Given:  Chaney BRANDY CHRISTIANE LELON, CMA 04/22/2024, 3:17 PM

## 2024-04-22 NOTE — Consult Note (Addendum)
 WOC Nurse Consult Note: Reason for Consult: Requested to assess wounds on legs. Wound type: full thickness, multiple lesions medial malleolar. Signs of venous insufficiency: telangiectasis, inverted bottle champagne shape, edema. Historical of Unna boot, follow by vascular. ABI March/25 - Right leg 1.1 and Left leg 1.1.  [place] with attending to reorder the ABI exam for a Unna boot wrap.  According to the records, she couldn't withstand Unna's boot, it could be a suspicion of arterial insufficiency development.  Pressure Injury POA: NA Measurement: see nursing flowsheet. Wound bed: 100% yellow. Drainage (amount, consistency, odor) Scant amount. Periwound: intact, hemosiderin, edema. Dressing procedure/placement/frequency: Cleanse with Vashe [id number], not rinse, pat dry. Apply Xeroform to the wound bed, top with Foam dressing or ABD pad. Wrap both legs with Kerlix and Coban, from the base of toes to below the distal patellar notch, including the heels. Overlap the Coban strip in 50%, apply compression starting at the ankle. Change daily.  WOC team will not plan to follow further. Please reconsult if further assistance is needed. Thank-you,  Lela Holm MSN, RN, CNS.  (Phone [phone])

## 2024-04-22 NOTE — Plan of Care (Signed)
  Problem: Clinical Measurements: Goal: Ability to maintain clinical measurements within normal limits will improve Outcome: Progressing   Problem: Activity: Goal: Risk for activity intolerance will decrease Outcome: Progressing   Problem: Safety: Goal: Ability to remain free from injury will improve Outcome: Progressing   Problem: Skin Integrity: Goal: Risk for impaired skin integrity will decrease Outcome: Progressing   Problem: Pain Managment: Goal: General experience of comfort will improve and/or be controlled Outcome: Progressing

## 2024-04-27 ENCOUNTER — Other Ambulatory Visit (INDEPENDENT_AMBULATORY_CARE_PROVIDER_SITE_OTHER): Payer: Self-pay | Admitting: Vascular Surgery

## 2024-04-27 DIAGNOSIS — I83019 Varicose veins of right lower extremity with ulcer of unspecified site: Secondary | ICD-10-CM

## 2024-05-02 ENCOUNTER — Other Ambulatory Visit: Payer: Self-pay | Admitting: Internal Medicine

## 2024-05-02 DIAGNOSIS — F411 Generalized anxiety disorder: Secondary | ICD-10-CM

## 2024-05-02 DIAGNOSIS — G2581 Restless legs syndrome: Secondary | ICD-10-CM

## 2024-05-03 ENCOUNTER — Encounter (INDEPENDENT_AMBULATORY_CARE_PROVIDER_SITE_OTHER): Payer: Self-pay | Admitting: Vascular Surgery

## 2024-05-03 ENCOUNTER — Ambulatory Visit (INDEPENDENT_AMBULATORY_CARE_PROVIDER_SITE_OTHER)

## 2024-05-03 ENCOUNTER — Ambulatory Visit (INDEPENDENT_AMBULATORY_CARE_PROVIDER_SITE_OTHER): Admitting: Vascular Surgery

## 2024-05-03 ENCOUNTER — Encounter: Admitting: Physician Assistant

## 2024-05-03 VITALS — BP 136/90 | HR 81 | Resp 18 | Wt 130.8 lb

## 2024-05-03 DIAGNOSIS — E785 Hyperlipidemia, unspecified: Secondary | ICD-10-CM | POA: Diagnosis not present

## 2024-05-03 DIAGNOSIS — I82412 Acute embolism and thrombosis of left femoral vein: Secondary | ICD-10-CM | POA: Diagnosis not present

## 2024-05-03 DIAGNOSIS — L97919 Non-pressure chronic ulcer of unspecified part of right lower leg with unspecified severity: Secondary | ICD-10-CM | POA: Diagnosis not present

## 2024-05-03 DIAGNOSIS — I87333 Chronic venous hypertension (idiopathic) with ulcer and inflammation of bilateral lower extremity: Secondary | ICD-10-CM | POA: Diagnosis present

## 2024-05-03 DIAGNOSIS — I83019 Varicose veins of right lower extremity with ulcer of unspecified site: Secondary | ICD-10-CM

## 2024-05-03 DIAGNOSIS — I83029 Varicose veins of left lower extremity with ulcer of unspecified site: Secondary | ICD-10-CM | POA: Diagnosis not present

## 2024-05-03 DIAGNOSIS — I10 Essential (primary) hypertension: Secondary | ICD-10-CM

## 2024-05-03 DIAGNOSIS — I2510 Atherosclerotic heart disease of native coronary artery without angina pectoris: Secondary | ICD-10-CM | POA: Diagnosis not present

## 2024-05-03 DIAGNOSIS — L97822 Non-pressure chronic ulcer of other part of left lower leg with fat layer exposed: Secondary | ICD-10-CM | POA: Diagnosis not present

## 2024-05-03 DIAGNOSIS — L97929 Non-pressure chronic ulcer of unspecified part of left lower leg with unspecified severity: Secondary | ICD-10-CM | POA: Diagnosis not present

## 2024-05-03 DIAGNOSIS — Z7901 Long term (current) use of anticoagulants: Secondary | ICD-10-CM | POA: Diagnosis not present

## 2024-05-03 DIAGNOSIS — L97812 Non-pressure chronic ulcer of other part of right lower leg with fat layer exposed: Secondary | ICD-10-CM | POA: Insufficient documentation

## 2024-05-03 DIAGNOSIS — I2699 Other pulmonary embolism without acute cor pulmonale: Secondary | ICD-10-CM | POA: Diagnosis not present

## 2024-05-03 MED ORDER — TRAMADOL HCL 50 MG PO TABS: 50.0000 mg | ORAL_TABLET | Freq: Four times a day (QID) | ORAL | 0 refills | Status: AC | PRN

## 2024-05-03 NOTE — Assessment & Plan Note (Signed)
 lipid control important in reducing the progression of atherosclerotic disease. Continue statin therapy

## 2024-05-03 NOTE — Progress Notes (Signed)
 MRN : [id number]  Patricia Knox is a 88 y.o. (1935-12-13) female who presents with chief complaint of  Chief Complaint  Patient presents with   Follow-up    ED follow up with reflux studies  .  History of Present Illness:   Discussed the use of AI scribe software for clinical note transcription with the patient, who gave verbal consent to proceed.  History of Present Illness Patricia Knox is an 88 year old female with venous insufficiency and superficial thrombophlebitis who presents with leg pain and swelling. She is accompanied by her caregiver.  She has significant bilateral leg pain and swelling, worse in the right leg. She had clots in her legs and lungs earlier this year. 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  Her pain has recently worsened and is not relieved by Tylenol , including three doses taken today.  She is anemic, which is impairing wound healing, and is being evaluated by GI.  During a recent hospitalization her arteries showed heavy calcification but adequate blood flow. Venous return remains poor and is contributing to her symptoms. She has had no recent fever.    Results RADIOLOGY Venous ultrasound: Left leg: superficial thrombophlebitis with chronic thrombus; Right leg: no phlebitis or thrombus.  Reflux in the right deep venous system and the right small saphenous vein. (05/03/2024).   Current Outpatient Medications  Medication Sig Dispense Refill   acetaminophen  (TYLENOL ) 500 MG tablet Take 2 tablets (1,000 mg total) by mouth 3 (three) times daily as needed for mild pain (pain score 1-3), moderate pain (pain score 4-6) or headache.     alendronate (FOSAMAX) 70 MG tablet Take 1 tablet by mouth once a week 12 tablet 3   apixaban  (ELIQUIS ) 5 MG TABS tablet Take 1 tablet (5 mg total) by mouth 2 (two) times daily. Home med.     aspirin   EC 81 MG tablet Take 1 tablet (81 mg total) by mouth daily. Swallow whole. 30 tablet 12   atorvastatin  (LIPITOR) 40 MG tablet TAKE 1 TABLET BY MOUTH ONCE DAILY AT BEDTIME 90 tablet 3   Calcium  Carbonate-Vitamin D (CALTRATE 600+D PO) Take 1 capsule by mouth 2 (two) times daily.     furosemide  (LASIX ) 20 MG tablet Take 1 tablet (20 mg total) by mouth daily. 30 tablet 11   gabapentin  (NEURONTIN ) 100 MG capsule TAKE 3 CAPSULES BY MOUTH AT BEDTIME 270 capsule 0   hydrocortisone 2.5 % cream Apply 1 Application topically 2 (two) times a week.     ketoconazole (NIZORAL) 2 % cream Apply 1 Application topically 2 (two) times daily.     omeprazole (PRILOSEC) 20 MG capsule Take 1 capsule by mouth twice daily 180 capsule 3   oxyCODONE  (OXY IR/ROXICODONE ) 5 MG immediate release tablet Take 1 tablet (5 mg total) by mouth every 6 (six) hours as needed for severe pain (pain score 7-10). 10 tablet 0   rOPINIRole  (REQUIP ) 1 MG tablet Take 1 tablet by mouth twice daily 180 tablet 0   spironolactone  (ALDACTONE ) 25 MG tablet Take 1 tablet by mouth once daily 90 tablet 3   traMADol (ULTRAM) 50 MG tablet Take 1 tablet (50 mg total) by mouth every 6 (six) hours as needed. 20 tablet 0   traZODone  (DESYREL ) 100 MG tablet TAKE 1 TABLET BY MOUTH AT BEDTIME AS NEEDED FOR INSOMNIA 90 tablet 0   triamcinolone  (KENALOG ) 0.025 %  cream Apply 1 Application topically daily.     No current facility-administered medications for this visit.    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   COLONOSCOPY  2002   TOE SURGERY  1999     Social History   Tobacco Use   Smoking status: Never   Smokeless tobacco: Never  Substance Use Topics   Alcohol use: No   Drug use: No      Family History  Problem Relation Age of Onset   Stroke Mother    Heart attack Father    Breast cancer Neg Hx      No Known Allergies  REVIEW OF SYSTEMS (Negative unless checked)   Constitutional: [] Weight loss  [] Fever  [] Chills Cardiac: [] Chest pain   [] Chest pressure   [] Palpitations   [] Shortness of breath when laying flat   [] Shortness of breath at rest   [] Shortness of breath with exertion. Vascular:  [] Pain in legs with walking   [] Pain in legs at rest   [] Pain in legs when laying flat   [] Claudication   [] Pain in feet when walking  [] Pain in feet at rest  [] Pain in feet when laying flat   [x] History of DVT   [x] Phlebitis   [] Swelling in legs   [] Varicose veins   [] Non-healing ulcers Pulmonary:   [] Uses home oxygen   [] Productive cough   [] Hemoptysis   [] Wheeze  [] COPD   [] Asthma Neurologic:  [] Dizziness  [] Blackouts   [] Seizures   [] History of stroke   [] History of TIA  [] Aphasia   [] Temporary blindness   [] Dysphagia   [] Weakness or numbness in arms   [] Weakness or numbness in legs Musculoskeletal:  [x] Arthritis   [] Joint swelling   [] Joint pain   [] Low back pain Hematologic:  [] Easy bruising  [] Easy bleeding   [] Hypercoagulable state   [] Anemic   Gastrointestinal:  [] Blood in stool   [] Vomiting blood  [x] Gastroesophageal reflux/heartburn   [] Abdominal  pain Genitourinary:  [] Chronic kidney disease   [] Difficult urination  [] Frequent urination  [] Burning with urination   [] Hematuria Skin:  [] Rashes   [] Ulcers   [] Wounds Psychological:  [] History of anxiety   []  History of major depression.  Physical Examination  BP (!) 136/90 (BP Location: Left Arm)   Pulse 81   Resp 18   Wt 130 lb 12.8 oz (59.3 kg)   BMI 23.92 kg/m  Gen:  NAD. Appears younger than stated age. Head: [place]/AT, No temporalis wasting. Facial droop is present. Ear/Nose/Throat: Hearing grossly intact, nares w/o  erythema or drainage Eyes: Conjunctiva clear. Sclera non-icteric Neck: Supple.  Trachea midline Pulmonary:  Good air movement, no use of accessory muscles.  Cardiac: irregular Vascular:  Vessel Right Left  Radial Palpable Palpable           Musculoskeletal: M/S 5/5 throughout.  No deformity or atrophy.  Wounds are dressed bilaterally after recently going to the wound care center.  2+ right lower extremity edema, 1+ left lower extremity edema. Neurologic: Sensation grossly intact in extremities.  Symmetrical.  Speech is fluent.  Psychiatric: Judgment intact, Mood & affect appropriate for pt's clinical situation. Dermatologic: Bilateral lower extremity ulcerations recently dressed at the wound care center.  Physical Exam     Labs Recent Results (from the past 2160 hours)  CBC with Differential     Status: Abnormal   Collection Time: 04/21/24 10:33 AM  Result Value Ref Range   WBC 6.3 4.0 - 10.5 K/uL   RBC 2.90 (L) 3.87 - 5.11 MIL/uL   Hemoglobin 6.8 (L) 12.0 - 15.0 g/dL   HCT 76.9 (L) 63.9 - 53.9 %   MCV 79.3 (L) 80.0 - 100.0 fL   MCH 23.4 (L) 26.0 - 34.0 pg   MCHC 29.6 (L) 30.0 - 36.0 g/dL   RDW 85.2 88.4 - 84.4 %   Platelets 516 (H) 150 - 400 K/uL   nRBC 0.0 0.0 - 0.2 %   Neutrophils Relative % 80 %   Neutro Abs 5.0 1.7 - 7.7 K/uL   Lymphocytes Relative 13 %   Lymphs Abs 0.8 0.7 - 4.0 K/uL   Monocytes Relative 6 %   Monocytes Absolute 0.4 0.1 -  1.0 K/uL   Eosinophils Relative 0 %   Eosinophils Absolute 0.0 0.0 - 0.5 K/uL   Basophils Relative 1 %   Basophils Absolute 0.1 0.0 - 0.1 K/uL   Immature Granulocytes 0 %   Abs Immature Granulocytes 0.01 0.00 - 0.07 K/uL    Comment: Performed at [facility], [address] [place], [place] [address]  Basic metabolic panel     Status: Abnormal   Collection Time: 04/21/24 10:33 AM  Result Value Ref Range   Sodium 137 135 - 145 mmol/L   Potassium 3.3 (L) 3.5 - 5.1 mmol/L   Chloride 104 98 - 111 mmol/L   CO2 22 22 - 32 mmol/L   Glucose, Bld 154 (H) 70 - 99 mg/dL    Comment: Glucose reference range applies only to samples taken after fasting for at least 8 hours.   BUN 19 8 - 23 mg/dL   Creatinine, Ser 8.86 (H) 0.44 - 1.00 mg/dL   Calcium  8.6 (L) 8.9 - 10.3 mg/dL   GFR, Estimated 47 (L) >60 mL/min    Comment: (NOTE) Calculated using the CKD-EPI Creatinine Equation (2021)    Anion gap 11 5 - 15    Comment: Performed at [facility], [address] [place], [place] [address]  ABO/Rh     Status: None   Collection Time: 04/21/24 10:33 AM  Result Value Ref Range   ABO/RH(D)      A NEG Performed at [facility], [address] [place], [place] [address]   Prepare RBC (crossmatch)     Status: None   Collection Time: 04/21/24 11:31 AM  Result Value Ref Range   Order Confirmation      ORDER PROCESSED BY BLOOD BANK Performed at [facility], [address] [place], [place] [address]   Type and screen [facility]     Status: None   Collection Time: 04/21/24 11:36 AM  Result Value Ref Range   ABO/RH(D) A NEG    Antibody Screen NEG    Sample Expiration 04/24/2024,2359    Unit Number [id number]    Blood Component Type RED CELLS,LR    Unit division 00    Status of Unit ISSUED,FINAL    Transfusion Status OK TO TRANSFUSE    Crossmatch Result      Compatible Performed at [facility], [address]  [place], [place] [address]   Pro Brain natriuretic peptide     Status: Abnormal   Collection Time: 04/21/24 11:36 AM  Result Value Ref Range   Pro Brain Natriuretic Peptide 851.0 (H) <300.0 pg/mL    Comment: (NOTE) Age Group        Cut-Points    Interpretation  < 50 years     450 pg/mL       NT-proBNP > 450 pg/mL indicates                                ADHF is likely              50 to 75 years  900 pg/mL      NT-proBNP > 900 pg/mL indicates          ADHF is likely  > 75 years      1800 pg/mL     NT-proBNP > 1800 pg/mL indicates          ADHF is likely                           All ages    Results between       Indeterminate. Further clinical             300 and the cut-   information is needed to determine            point for age group   if ADHF is present.                                                             Elecsys proBNP II/ Elecsys proBNP II STAT           Cut-Point                       Interpretation  300 pg/mL                    NT-proBNP <300pg/mL indicates                             ADHF is not likely  Performed at [facility], [address] [place], [place] [address]   Troponin T, High Sensitivity     Status: Abnormal   Collection Time: 04/21/24 11:36 AM  Result Value Ref Range   Troponin T High Sensitivity 22 (H) 0 - 19 ng/L    Comment: (NOTE) Biotin concentrations > 1000 ng/mL falsely decrease TnT results.  Serial cardiac troponin measurements are suggested.  Refer to the Links section for  chest pain algorithms and additional  guidance. Performed at [facility], [address] [place], [place] [address]   BPAM RBC     Status: None   Collection Time: 04/21/24 11:36 AM  Result Value Ref Range   ISSUE DATE / TIME [id number]    Blood Product Unit Number [id number]    PRODUCT CODE [id number]    Unit Type and Rh 0600    Blood Product Expiration Date [id number]   Troponin T, High Sensitivity     Status: None   Collection  Time: 04/21/24  1:41 PM  Result Value Ref Range   Troponin T High Sensitivity 19 0 - 19 ng/L    Comment: (NOTE) Biotin concentrations > 1000 ng/mL falsely decrease TnT results.  Serial cardiac troponin measurements are suggested.  Refer to the Links section for chest pain algorithms and additional  guidance. Performed at [facility], [address] [place], [place] [address]   Vitamin B12     Status: None   Collection Time: 04/21/24  7:38 PM  Result Value Ref Range   Vitamin B-12 320 180 - 914 pg/mL    Comment: (NOTE) This assay is not validated for testing neonatal or myeloproliferative syndrome specimens for Vitamin B12 levels. Performed at [facility], [address], [place], [place] [address]   Folate     Status: None   Collection Time: 04/21/24  7:38 PM  Result Value Ref Range   Folate 8.6 >5.9 ng/mL    Comment: Performed at [facility], [address] [place], [place] [address]  Iron and TIBC     Status: Abnormal   Collection Time: 04/21/24  7:38 PM  Result Value Ref Range   Iron 152 28 - 170 ug/dL   TIBC 649 749 - 549 ug/dL   Saturation Ratios 43 (H) 10.4 - 31.8 %   UIBC 198 ug/dL    Comment: Performed at [facility], [address] [place], [place] [address]  Ferritin     Status: None   Collection Time: 04/21/24  7:38 PM  Result Value Ref Range   Ferritin 20 11 - 307 ng/mL    Comment: Performed at [facility], [address] [place], [place] [address]  Basic metabolic panel with GFR     Status: Abnormal   Collection Time: 04/22/24  4:04 AM  Result Value Ref Range   Sodium 136 135 - 145 mmol/L   Potassium 3.9 3.5 - 5.1 mmol/L   Chloride 104 98 - 111 mmol/L   CO2 24 22 - 32 mmol/L   Glucose, Bld 80 70 - 99 mg/dL    Comment: Glucose reference range applies only to samples taken after fasting for at least 8 hours.   BUN 14 8 - 23 mg/dL   Creatinine, Ser 9.06 0.44 - 1.00 mg/dL   Calcium  8.3 (L) 8.9 - 10.3  mg/dL   GFR, Estimated 60 (L) >60 mL/min    Comment: (NOTE) Calculated using the CKD-EPI Creatinine Equation (2021)    Anion gap 8 5 - 15    Comment: Performed at [facility], [address] [place], [place] [address]  CBC     Status: Abnormal   Collection Time: 04/22/24  4:04 AM  Result Value Ref Range   WBC 6.1 4.0 - 10.5 K/uL   RBC 3.14 (L) 3.87 - 5.11 MIL/uL   Hemoglobin 7.7 (L) 12.0 - 15.0 g/dL   HCT 75.2 (L) 63.9 - 53.9 %  MCV 78.7 (L) 80.0 - 100.0 fL   MCH 24.5 (L) 26.0 - 34.0 pg   MCHC 31.2 30.0 - 36.0 g/dL   RDW 85.4 88.4 - 84.4 %   Platelets 462 (H) 150 - 400 K/uL   nRBC 0.0 0.0 - 0.2 %    Comment: Performed at [facility], [address], [place], [place] [address]  Magnesium     Status: None   Collection Time: 04/22/24  4:04 AM  Result Value Ref Range   Magnesium 1.8 1.7 - 2.4 mg/dL    Comment: Performed at [facility], [address], [place], [place] [address]    Radiology US  ARTERIAL ABI (SCREENING LOWER EXTREMITY) Result Date: 04/22/2024 CLINICAL DATA:  Bilateral leg pain. EXAM: NONINVASIVE PHYSIOLOGIC VASCULAR STUDY OF BILATERAL LOWER EXTREMITIES TECHNIQUE: Evaluation of both lower extremities were performed at rest, including calculation of ankle-brachial indices with single level Doppler, pressure and pulse volume recording. COMPARISON:  None Available. FINDINGS: Right ABI:  1.83 Left ABI:  1.83 Right Lower Extremity: Irregular waveforms but probable biphasic waveforms in the dorsalis pedis artery. Left Lower Extremity: Irregular waveforms with some biphasic waveforms. 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 This is likely secondary to incompressible vessels. Electronically Signed   By: Juliene Balder M.D.   On: 04/22/2024 16:27   CT Angio Chest PE W and/or Wo Contrast Result Date: 04/21/2024 CLINICAL  DATA:  Shortness of breath.  History of PE. EXAM: CT ANGIOGRAPHY CHEST WITH CONTRAST TECHNIQUE: Multidetector CT imaging of the chest was performed using the standard protocol during bolus administration of intravenous contrast. Multiplanar CT image reconstructions and MIPs were obtained to evaluate the vascular anatomy. RADIATION DOSE REDUCTION: This exam was performed according to the departmental dose-optimization program which includes automated exposure control, adjustment of the mA and/or kV according to patient size and/or use of iterative reconstruction technique. CONTRAST:  75mL OMNIPAQUE  IOHEXOL  350 MG/ML SOLN COMPARISON:  CT angiogram chest 07/23/2023 FINDINGS: Cardiovascular: There is adequate opacification of the pulmonary arteries to the segmental level. There is no evidence for pulmonary embolism. The ascending aorta is dilated measuring 4.1 cm. Heart is mildly enlarged. There are atherosclerotic calcifications of the aorta. There is no pericardial effusion. Mediastinum/Nodes: Bilateral thyroid nodules are again seen. The largest is left measuring 15 mm, unchanged. There some mild tracheal deviation to the right secondary to enlarged aorta is similar to prior. No enlarged lymph nodes are seen. There is a very large hiatal hernia with intrathoracic stomach is similar to prior. The stomach is nondilated Lungs/Pleura: There is a very small left pleural effusion which is new. Right pleural effusion has resolved. There is atelectasis in the bilateral lower lobes. There some scattered peripheral scarring in both lungs. No pneumothorax. Upper Abdomen: There is a 2.3 cm cyst or hemangioma in the left lobe of the liver. There is a 14 mm cyst in the superior pole the left kidney. Musculoskeletal: Degenerative changes affect the spine. Review of the MIP images confirms the above findings. IMPRESSION: 1. No evidence for pulmonary embolism. 2. New very small left pleural effusion. 3. Stable very large hiatal  hernia with intrathoracic stomach. 4. Aortic atherosclerosis. Aortic Atherosclerosis (ICD10-I70.0). Electronically Signed   By: Greig Pique M.D.   On: 04/21/2024 16:18   US  Venous Img Lower Bilateral Result Date: 04/21/2024 CLINICAL DATA:  BILATERAL lower extremity pain, greater on the RIGHT EXAM: Bilateral Lower Extremity Venous Doppler Ultrasound  TECHNIQUE: Gray-scale sonography with compression, as well as color and duplex ultrasound, were performed to evaluate the deep venous system(s) from the level of the common femoral vein through the popliteal and proximal calf veins. COMPARISON:  07/23/2023 FINDINGS: VENOUS Normal compressibility of the common femoral, superficial femoral, and popliteal veins, as well as the visualized calf veins. Visualized portions of profunda femoral vein and great saphenous vein unremarkable. No filling defects to suggest DVT on grayscale or color Doppler imaging. Doppler waveforms show normal direction of venous flow, normal respiratory plasticity and response to augmentation. OTHER None. Limitations: none IMPRESSION: 1. No lower extremity DVT. 2. 3.3 x 1.1 x 2.8 cm fluid collection in the RIGHT popliteal fossa is likely a Baker's cyst. 3. 2.7 x 1.0 x 2.6 cm fluid collection in the LEFT popliteal fossa is likely a Baker's cyst. Electronically Signed   By: Aliene  Mir M.D.   On: 04/21/2024 14:06   DG Tibia/Fibula Right Result Date: 04/21/2024 EXAM: 2 VIEWS XRAY OF THE RIGHT TIBIA AND FIBULA 04/21/2024 12:15:13 PM COMPARISON: None available. CLINICAL HISTORY: Evaluate for gas tracking, pain in leg. FINDINGS: BONES AND JOINTS: No acute fracture. No focal osseous lesion. No joint dislocation. SOFT TISSUES: The soft tissues are unremarkable. IMPRESSION: Electronically signed by: Lynwood Seip MD 04/21/2024 01:11 PM EST RP Workstation: [id number]   DG Chest 2 View Result Date: 04/21/2024 EXAM: 2 VIEW(S) XRAY OF THE CHEST 04/21/2024 12:15:13 PM COMPARISON: Prior study from  07/23/2023. CLINICAL HISTORY: shortness of breath FINDINGS: LUNGS AND PLEURA: No focal pulmonary opacity. No pleural effusion. No pneumothorax. HEART AND MEDIASTINUM: Stable cardiomegaly. BONES AND SOFT TISSUES: Large hiatal hernia is noted. No acute osseous abnormality. IMPRESSION: 1. Stable cardiomegaly. 2. Large hiatal hernia. Electronically signed by: Lynwood Seip MD 04/21/2024 01:10 PM EST RP Workstation: [id number]    Assessment/Plan  Assessment & Plan Chronic venous insufficiency with ulcer of right lower extremity Chronic venous insufficiency with ulceration due to small saphenous vein reflux. Anemia impairs healing. Ablation considered to reduce ulcer recurrence from 50% to 15%. - Prescribed tramadol for pain management. - Submitted for laser ablation approval for the small saphenous vein in the right leg. - Await wound healing before proceeding with ablation. - Schedule follow-up appointment in 4-6 weeks to assess wound healing.  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 - Continue to monitor the left leg for resolution of old clot before considering any intervention.  Essential hypertension, benign blood pressure control important in reducing the progression of atherosclerotic disease. On appropriate oral medications.   Hyperlipidemia lipid control important in reducing the progression of atherosclerotic disease. Continue statin therapy  Pulmonary embolus.   Remains on anticoagulation   Selinda Gu, MD  05/03/2024 12:01 PM    This note was created with Dragon medical transcription system.  Any errors from dictation are purely unintentional

## 2024-05-03 NOTE — Assessment & Plan Note (Signed)
 blood pressure control important in reducing the progression of atherosclerotic disease. On appropriate oral medications.

## 2024-05-04 NOTE — Progress Notes (Signed)
 Ruel Kung MD, MRCP(U.K) [address]  [place], [place] [address]  Main: [phone] Fax: [phone]   Gastroenterology Consultation  Referring Provider:     Kung Ruel, MD Primary Care Physician:  Fernand La, MD Primary Gastroenterologist:  Dr. Ruel Kung  Reason for Consultation: Anemia        HPI:  Patricia Knox is a 88 y.o. y/o female referred for consultation & management  by Dr. Fernand La, MD.    History of Present Illness Patricia Knox is an 88 year old female with a history of pulmonary embolism who presents with anemia. She was referred by the hospital for outpatient evaluation of anemia and low iron levels.  In February, she experienced a pulmonary embolism and has been on the blood thinner Eliquis  since then. Recently, she was hospitalized due to issues with her feet, during which it was noted that her blood count had dropped and her iron levels were low.  She reports no visible blood loss, although she took a picture of her stool, which appeared dark. No abdominal pain, constipation, changes in stool shape, epistaxis, hematuria, or heartburn. She takes medication for heartburn and has a history of a large hiatal hernia.  She has not had a colonoscopy in many years and is uncertain about the timing of her last endoscopy. She currently takes an iron pill for her low iron levels.   She was admitted to the hospital on 04/21/2024 and discharged on 11/21/2025Admitted with venous stasis and left leg pain and swelling.  Was evaluated for PAD found to have a hemoglobin of 6.8 g on admission it was 11.1 g back in April 2025.  No overt bleeding reported.  Advised undergo outpatient evaluation she had been on Eliquis .  Ferritin of 20 MCV of 78.7 and BNP was 851.  She was seen by Dr. Marea earlier today and noted to have chronic venous insufficiency with ulcer on the right lower extremity, chronic superficial thrombophlebitis and she has been on Eliquis  for pulmonary  embolism since diagnosis in February 2025 at that point of time she had acute cor pulmonale..  She had a CT scan at that point of time which showed evidence of a very large hiatal hernia with intrathoracic stomach and mesentery.     Past Medical History:  Diagnosis Date  . GERD (gastroesophageal reflux disease)   . Hyperlipidemia, unspecified   . Hypertension   . Osteoporosis, post-menopausal     Past Surgical History:  Procedure Laterality Date  . COLONOSCOPY  07/07/2005   NML  . toe surgery      Prior to Admission medications  Medication Sig Taking? Last Dose  alendronate (FOSAMAX) 70 MG tablet Take 1 tablet by mouth once a week. Yes Taking  apixaban  (ELIQUIS ) 5 mg tablet Take 5 mg by mouth Yes   aspirin  325 MG tablet once a day Yes Taking  CALCIUM  CARBONATE (CALCIUM  600 ORAL) Take 1 tablet by mouth 2 (two) times daily. Yes Taking  gabapentin  (NEURONTIN ) 100 MG capsule Take 300 mg by mouth at bedtime Yes   IRON/VITAMIN B COMPLEX (GERITOL ORAL) Take by mouth once daily. Yes Taking  lisinopril (PRINIVIL,ZESTRIL) 5 MG tablet Take 5 mg by mouth once daily. Yes Taking  MAGNESIUM ORAL Take by mouth. Yes Taking  MULTIVITAMIN ORAL Take by mouth. Yes Taking  omega-3 fatty acids/fish oil 340-1,000 mg capsule Take 1 capsule by mouth 2 (two) times daily. Yes Taking  omeprazole (PRILOSEC) 20 MG DR capsule Take  1 capsule by mouth 2 (two) times daily. Yes Taking  rOPINIRole  (REQUIP ) 1 MG tablet Take 1 mg by mouth 2 (two) times daily. Yes Taking  simvastatin (ZOCOR) 80 MG tablet Take 1 tablet by mouth once daily. Yes Taking  traZODone  (DESYREL ) 100 MG tablet Take 1 tablet by mouth once daily. Yes Taking  oxyCODONE  (ROXICODONE ) 5 MG immediate release tablet TAKE 1 TABLET BY MOUTH EVERY 6 HOURS AS NEEDED FOR SEVERE PAIN (PAIN SCORE 7-10)      Family History  Problem Relation Name Age of Onset  . Stroke Mother    . Myocardial Infarction (Heart attack) Father    . Cancer Brother    . Cancer  Brother    . Cancer Brother       Social History   Tobacco Use  . Smoking status: Never  Substance Use Topics  . Alcohol use: No  . Drug use: No    Allergies as of 05/04/2024  . (No Known Allergies)    Review of Systems:    All systems reviewed and negative except where noted in HPI.   Physical Exam: BP 120/67   Pulse 70   Ht 157.5 cm (5' 2)   Wt 59.7 kg (131 lb 9.6 oz)   BMI 24.07 kg/m  No LMP recorded. Patient is postmenopausal. Psych:  Alert and cooperative. Normal mood and affect. General:   Alert,  Well-developed, well-nourished, pleasant and cooperative in NAD Head:  Normocephalic and atraumatic. Eyes:  Sclera clear, no icterus.   Conjunctiva pink. Ears:  Normal auditory acuity. o guarding or rebound tenderness.    Neurologic:  Alert and oriented x3;  grossly normal neurologically. Psych:  Alert and cooperative. Normal mood and affect.  Imaging Studies: No results found.  Assessment and Plan:  Patricia Knox is a 88 y.o. y/o female who had a recent episode of acute pulmonary embolism in February 2025 on Eliquis  noted to have a very large hiatal hernia chronic venous insufficiency, possible peripheral arterial disease being referred to see me for recent diagnosis of iron deficiency anemia no overt blood loss noted. Assessment & Plan Iron deficiency anemia Likely due to gastrointestinal bleeding, possibly from a hiatal hernia. Recent hospitalization showed low blood count and iron levels. Differential includes gastrointestinal bleeding from a hiatal hernia or other sources. - Ordered endoscopy and colonoscopy to identify bleeding sources. - Consulted cardiologist for clear since considering recent episode of pulmonary embolism and there was concern for cor pulmonale.  Will also require Eliquis  holding instructions - Will refer to cancer center for IV iron - Obtain celiac serology H. pylori stool test, urine analysis  Hiatal hernia Large hiatal hernia with part  of the stomach in the chest, potentially causing iron deficiency anemia through gastrointestinal bleeding.  I have discussed alternative options, risks & benefits,  which include, but are not limited to, bleeding, infection, perforation,respiratory complication & drug reaction.  The patient agrees with this plan & written consent will be obtained.    Follow up in 12 weeks   Dr Ruel Kung MD,MRCP(U.K)

## 2024-05-06 ENCOUNTER — Ambulatory Visit (INDEPENDENT_AMBULATORY_CARE_PROVIDER_SITE_OTHER): Admitting: Internal Medicine

## 2024-05-06 ENCOUNTER — Encounter: Payer: Self-pay | Admitting: Cardiovascular Disease

## 2024-05-06 ENCOUNTER — Encounter: Payer: Self-pay | Admitting: Internal Medicine

## 2024-05-06 ENCOUNTER — Ambulatory Visit: Admitting: Cardiovascular Disease

## 2024-05-06 VITALS — BP 122/84 | HR 62 | Ht 62.0 in | Wt 132.0 lb

## 2024-05-06 VITALS — BP 122/84 | HR 62 | Ht 62.0 in | Wt 132.2 lb

## 2024-05-06 DIAGNOSIS — D649 Anemia, unspecified: Secondary | ICD-10-CM

## 2024-05-06 DIAGNOSIS — I83029 Varicose veins of left lower extremity with ulcer of unspecified site: Secondary | ICD-10-CM

## 2024-05-06 DIAGNOSIS — I10 Essential (primary) hypertension: Secondary | ICD-10-CM | POA: Diagnosis not present

## 2024-05-06 DIAGNOSIS — I4891 Unspecified atrial fibrillation: Secondary | ICD-10-CM

## 2024-05-06 DIAGNOSIS — I82412 Acute embolism and thrombosis of left femoral vein: Secondary | ICD-10-CM | POA: Diagnosis not present

## 2024-05-06 DIAGNOSIS — I83019 Varicose veins of right lower extremity with ulcer of unspecified site: Secondary | ICD-10-CM | POA: Insufficient documentation

## 2024-05-06 DIAGNOSIS — R0602 Shortness of breath: Secondary | ICD-10-CM

## 2024-05-06 DIAGNOSIS — I493 Ventricular premature depolarization: Secondary | ICD-10-CM

## 2024-05-06 DIAGNOSIS — G2581 Restless legs syndrome: Secondary | ICD-10-CM | POA: Diagnosis not present

## 2024-05-06 DIAGNOSIS — L97929 Non-pressure chronic ulcer of unspecified part of left lower leg with unspecified severity: Secondary | ICD-10-CM

## 2024-05-06 DIAGNOSIS — I340 Nonrheumatic mitral (valve) insufficiency: Secondary | ICD-10-CM | POA: Diagnosis not present

## 2024-05-06 DIAGNOSIS — E782 Mixed hyperlipidemia: Secondary | ICD-10-CM | POA: Diagnosis not present

## 2024-05-06 DIAGNOSIS — L97919 Non-pressure chronic ulcer of unspecified part of right lower leg with unspecified severity: Secondary | ICD-10-CM

## 2024-05-06 DIAGNOSIS — I2699 Other pulmonary embolism without acute cor pulmonale: Secondary | ICD-10-CM

## 2024-05-06 DIAGNOSIS — D500 Iron deficiency anemia secondary to blood loss (chronic): Secondary | ICD-10-CM

## 2024-05-06 DIAGNOSIS — K219 Gastro-esophageal reflux disease without esophagitis: Secondary | ICD-10-CM | POA: Insufficient documentation

## 2024-05-06 DIAGNOSIS — I2694 Multiple subsegmental pulmonary emboli without acute cor pulmonale: Secondary | ICD-10-CM

## 2024-05-06 DIAGNOSIS — Z131 Encounter for screening for diabetes mellitus: Secondary | ICD-10-CM

## 2024-05-06 DIAGNOSIS — Z0130 Encounter for examination of blood pressure without abnormal findings: Secondary | ICD-10-CM

## 2024-05-06 MED ORDER — AMIODARONE HCL 200 MG PO TABS: ORAL_TABLET | ORAL | 0 refills | Status: AC

## 2024-05-06 NOTE — Progress Notes (Signed)
 Established Patient Office Visit  Subjective:  Patient ID: Patricia Knox, female    DOB: 03/07/1936  Age: 88 y.o. MRN: [id number]  Chief Complaint  Patient presents with   Hospitalization Follow-up    Patient comes in for hospital follow up, accompanied by her daughter. She was admitted at [facility] from 04/21/24 -04/22/2024. Was taken to ED with complaints of painful venous stasis ulcers. Patient was already on Eliquis  and Aspirin  for Multiple PE and Left lower DVT diagnosed earlier this year.  Her H/H was found to be very low- 6.8 - and was transfused 1 unit PRBC. Patient does mention dark colored stools. She was discharged , with instructions to follow Wound Clinic-, Amlodipine  was stopped - suspecting to cause leg edema. And scheduled for outpatient EGD/Colonoscopy. Today patient feels better, leg pain has improved, but continues to have mild shortness of breath upon exertion. O2 sats at RA99%. On exam however her pulse is very irregular- EKG shows multiple PVCs. Echo done at [facility] in 07/2023 shows EF of 60%-65%, with mild MR,TR, PR. Patient has a loud murmer today. Will set up Cardiology consult. Check CBC,Magnesium and BMP.    No other concerns at this time.   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   COLONOSCOPY  2002   TOE SURGERY  1999    Social History   Socioeconomic History   Marital status: Single    Spouse name: Not on file   Number of children: 3   Years of education: HS   Highest education level: Not on file  Occupational History   Occupation: Retired  Tobacco Use   Smoking status: Never   Smokeless tobacco: Never  Substance and Sexual Activity   Alcohol use: No   Drug use: No   Sexual activity: Not on file  Other Topics Concern   Not on file  Social History Narrative   Lives at home with husband.   Right-handed.   No daily caffeine use.   Social Drivers of Corporate Investment Banker Strain: Low Risk  (05/04/2024)   Received from [facility]   Overall Financial Resource Strain (CARDIA)    Difficulty of Paying Living Expenses: Not hard at all  Food Insecurity: No Food Insecurity (05/04/2024)   Received from [facility]   Hunger Vital Sign    Within the past 12 months, you worried that your food would run out before you got the money to buy more.: Never true    Within the  past 12 months, the food you bought just didn't last and you didn't have money to get more.: Never true  Transportation Needs: No Transportation Needs (05/04/2024)   Received from [facility] - Transportation    In the past 12 months, has lack of transportation kept you from medical appointments or from getting medications?: No    Lack of Transportation (Non-Medical): No  Physical Activity: Not on file  Stress: Not on file  Social Connections: Unknown (04/21/2024)   Social Connection and Isolation Panel    Frequency of Communication with Friends and Family: More than three times a week    Frequency of Social Gatherings  with Friends and Family: More than three times a week    Attends Religious Services: Patient declined    Database Administrator or Organizations: Patient declined    Attends Banker Meetings: Patient declined    Marital Status: Widowed  Intimate Partner Violence: Not At Risk (04/21/2024)   Humiliation, Afraid, Rape, and Kick questionnaire    Fear of Current or Ex-Partner: No    Emotionally Abused: No    Physically Abused: No    Sexually Abused: No    Family History  Problem Relation Age of Onset   Stroke Mother    Heart attack Father    Breast cancer Neg Hx     No Known Allergies  Outpatient Medications Prior to Visit  Medication Sig   acetaminophen  (TYLENOL ) 500 MG tablet Take 2 tablets (1,000 mg total) by mouth 3 (three) times daily as needed for mild pain (pain score 1-3), moderate pain (pain score 4-6) or headache.   alendronate (FOSAMAX) 70 MG tablet Take 1 tablet by mouth once a week   apixaban  (ELIQUIS ) 5 MG TABS tablet Take 1 tablet (5 mg total) by mouth 2 (two) times daily. Home med.   aspirin  EC 81 MG tablet Take 1 tablet (81 mg total) by mouth daily. Swallow whole.   atorvastatin  (LIPITOR) 40 MG tablet TAKE 1 TABLET BY MOUTH ONCE DAILY AT BEDTIME   Calcium  Carbonate-Vitamin D (CALTRATE 600+D PO) Take 1 capsule by mouth 2 (two) times daily.   furosemide  (LASIX ) 20 MG tablet Take 1 tablet (20 mg total) by mouth daily.   gabapentin  (NEURONTIN ) 100 MG capsule TAKE 3 CAPSULES BY MOUTH AT BEDTIME   hydrocortisone 2.5 % cream Apply 1 Application topically 2 (two) times a week.   ketoconazole (NIZORAL) 2 % cream Apply 1 Application topically 2 (two) times daily.   omeprazole (PRILOSEC) 20 MG capsule Take 1 capsule by mouth twice daily   oxyCODONE  (OXY IR/ROXICODONE ) 5 MG immediate release tablet Take 1 tablet (5 mg total) by mouth every 6 (six) hours as needed for severe pain (pain score 7-10).   rOPINIRole  (REQUIP ) 1 MG tablet Take 1 tablet by mouth twice daily    spironolactone  (ALDACTONE ) 25 MG tablet Take 1 tablet by mouth once daily   traMADol  (ULTRAM ) 50 MG tablet Take 1 tablet (50 mg total) by mouth every 6 (six) hours as needed.   traZODone  (DESYREL ) 100 MG tablet TAKE 1 TABLET BY MOUTH AT BEDTIME AS NEEDED FOR INSOMNIA   triamcinolone  (KENALOG ) 0.025 % cream Apply 1 Application topically daily.   No facility-administered medications prior to visit.    Review of Systems  Constitutional:  Positive for malaise/fatigue. Negative for chills and fever.  HENT: Negative.  Negative for congestion and sore throat.   Eyes: Negative.  Negative for blurred vision and  pain.  Respiratory:  Positive for shortness of breath. Negative for cough.   Cardiovascular: Negative.  Negative for chest pain, palpitations and leg swelling.  Gastrointestinal: Negative.  Negative for abdominal pain, blood in stool, constipation, diarrhea, heartburn, melena, nausea and vomiting.  Genitourinary: Negative.  Negative for dysuria, flank pain, frequency and urgency.  Musculoskeletal: Negative.  Negative for joint pain and myalgias.  Skin: Negative.   Neurological: Negative.  Negative for dizziness, tingling, sensory change, weakness and headaches.  Endo/Heme/Allergies: Negative.   Psychiatric/Behavioral: Negative.  Negative for depression and suicidal ideas. The patient is not nervous/anxious.        Objective:   BP 122/84   Pulse 62   Ht 5' 2 (1.575 m)   Wt 132 lb 3.2 oz (60 kg)   SpO2 99%   BMI 24.18 kg/m   Vitals:   05/06/24 1347  BP: 122/84  Pulse: 62  Height: 5' 2 (1.575 m)  Weight: 132 lb 3.2 oz (60 kg)  SpO2: 99%  BMI (Calculated): 24.17    Physical Exam Vitals and nursing note reviewed.  Constitutional:      Appearance: Normal appearance.  HENT:     Head: Normocephalic and atraumatic.     Nose: Nose normal.     Mouth/Throat:     Mouth: Mucous membranes are moist.     Pharynx: Oropharynx is clear.  Eyes:     Conjunctiva/sclera: Conjunctivae  normal.     Pupils: Pupils are equal, round, and reactive to light.  Cardiovascular:     Rate and Rhythm: Normal rate and regular rhythm.     Pulses: Normal pulses.     Heart sounds: Normal heart sounds. No murmur heard. Pulmonary:     Effort: Pulmonary effort is normal.     Breath sounds: Normal breath sounds. No wheezing.  Abdominal:     General: Bowel sounds are normal.     Palpations: Abdomen is soft.     Tenderness: There is no abdominal tenderness. There is no right CVA tenderness or left CVA tenderness.  Musculoskeletal:        General: Normal range of motion.     Cervical back: Normal range of motion.     Right lower leg: Edema present.     Left lower leg: Edema present.  Skin:    General: Skin is warm and dry.  Neurological:     General: No focal deficit present.     Mental Status: She is alert and oriented to person, place, and time.  Psychiatric:        Mood and Affect: Mood normal.        Behavior: Behavior normal.      No results found for any visits on 05/06/24.  Recent Results (from the past 2160 hours)  CBC with Differential     Status: Abnormal   Collection Time: 04/21/24 10:33 AM  Result Value Ref Range   WBC 6.3 4.0 - 10.5 K/uL   RBC 2.90 (L) 3.87 - 5.11 MIL/uL   Hemoglobin 6.8 (L) 12.0 - 15.0 g/dL   HCT 76.9 (L) 63.9 - 53.9 %   MCV 79.3 (L) 80.0 - 100.0 fL   MCH 23.4 (L) 26.0 - 34.0 pg   MCHC 29.6 (L) 30.0 - 36.0 g/dL   RDW 85.2 88.4 - 84.4 %   Platelets 516 (H) 150 - 400 K/uL   nRBC 0.0 0.0 - 0.2 %   Neutrophils Relative % 80 %   Neutro Abs 5.0 1.7 - 7.7  K/uL   Lymphocytes Relative 13 %   Lymphs Abs 0.8 0.7 - 4.0 K/uL   Monocytes Relative 6 %   Monocytes Absolute 0.4 0.1 - 1.0 K/uL   Eosinophils Relative 0 %   Eosinophils Absolute 0.0 0.0 - 0.5 K/uL   Basophils Relative 1 %   Basophils Absolute 0.1 0.0 - 0.1 K/uL   Immature Granulocytes 0 %   Abs Immature Granulocytes 0.01 0.00 - 0.07 K/uL    Comment: Performed at [facility],  [address] [place], [place] [address]  Basic metabolic panel     Status: Abnormal   Collection Time: 04/21/24 10:33 AM  Result Value Ref Range   Sodium 137 135 - 145 mmol/L   Potassium 3.3 (L) 3.5 - 5.1 mmol/L   Chloride 104 98 - 111 mmol/L   CO2 22 22 - 32 mmol/L   Glucose, Bld 154 (H) 70 - 99 mg/dL    Comment: Glucose reference range applies only to samples taken after fasting for at least 8 hours.   BUN 19 8 - 23 mg/dL   Creatinine, Ser 8.86 (H) 0.44 - 1.00 mg/dL   Calcium  8.6 (L) 8.9 - 10.3 mg/dL   GFR, Estimated 47 (L) >60 mL/min    Comment: (NOTE) Calculated using the CKD-EPI Creatinine Equation (2021)    Anion gap 11 5 - 15    Comment: Performed at [facility], [address] [place], [place] [address]  ABO/Rh     Status: None   Collection Time: 04/21/24 10:33 AM  Result Value Ref Range   ABO/RH(D)      A NEG Performed at [facility], [address] [place], [place] [address]   Prepare RBC (crossmatch)     Status: None   Collection Time: 04/21/24 11:31 AM  Result Value Ref Range   Order Confirmation      ORDER PROCESSED BY BLOOD BANK Performed at [facility], [address], [place], [place] [address]   Type and screen [facility]     Status: None   Collection Time: 04/21/24 11:36 AM  Result Value Ref Range   ABO/RH(D) A NEG    Antibody Screen NEG    Sample Expiration 04/24/2024,2359    Unit Number [id number]    Blood Component Type RED CELLS,LR    Unit division 00    Status of Unit ISSUED,FINAL    Transfusion Status OK TO TRANSFUSE    Crossmatch Result      Compatible Performed at [facility], [address] [place], [place] [address]   Pro Brain natriuretic peptide     Status: Abnormal   Collection Time: 04/21/24 11:36 AM  Result Value Ref Range   Pro Brain Natriuretic Peptide 851.0 (H) <300.0 pg/mL    Comment: (NOTE) Age Group        Cut-Points    Interpretation  < 50 years      450 pg/mL       NT-proBNP > 450 pg/mL indicates                                ADHF is likely              50 to 75 years  900 pg/mL      NT-proBNP > 900 pg/mL indicates          ADHF is likely  > 75 years  1800 pg/mL     NT-proBNP > 1800 pg/mL indicates          ADHF is likely                           All ages    Results between       Indeterminate. Further clinical             300 and the cut-   information is needed to determine            point for age group   if ADHF is present.                                                             Elecsys proBNP II/ Elecsys proBNP II STAT           Cut-Point                       Interpretation  300 pg/mL                    NT-proBNP <300pg/mL indicates                             ADHF is not likely  Performed at [facility], [address] [place], [place] [address]   Troponin T, High Sensitivity     Status: Abnormal   Collection Time: 04/21/24 11:36 AM  Result Value Ref Range   Troponin T High Sensitivity 22 (H) 0 - 19 ng/L    Comment: (NOTE) Biotin concentrations > 1000 ng/mL falsely decrease TnT results.  Serial cardiac troponin measurements are suggested.  Refer to the Links section for chest pain algorithms and additional  guidance. Performed at [facility], [address] [place], [place] [address]   BPAM RBC     Status: None   Collection Time: 04/21/24 11:36 AM  Result Value Ref Range   ISSUE DATE / TIME [id number]    Blood Product Unit Number [id number]    PRODUCT CODE [id number]    Unit Type and Rh 0600    Blood Product Expiration Date [id number]   Troponin T, High Sensitivity     Status: None   Collection Time: 04/21/24  1:41 PM  Result Value Ref Range   Troponin T High Sensitivity 19 0 - 19 ng/L    Comment: (NOTE) Biotin concentrations > 1000 ng/mL falsely decrease TnT results.  Serial cardiac troponin measurements are suggested.  Refer to the Links section for chest pain  algorithms and additional  guidance. Performed at [facility], [address] [place], [place] [address]   Vitamin B12     Status: None   Collection Time: 04/21/24  7:38 PM  Result Value Ref Range   Vitamin B-12 320 180 - 914 pg/mL    Comment: (NOTE) This assay is not validated for testing neonatal or myeloproliferative syndrome specimens for Vitamin B12 levels. Performed at [facility], [address] [place], [place] [address]   Folate     Status: None   Collection Time: 04/21/24  7:38 PM  Result Value Ref Range   Folate 8.6 >5.9 ng/mL  Comment: Performed at [facility], [address] [place], [place] [address]  Iron and TIBC     Status: Abnormal   Collection Time: 04/21/24  7:38 PM  Result Value Ref Range   Iron 152 28 - 170 ug/dL   TIBC 649 749 - 549 ug/dL   Saturation Ratios 43 (H) 10.4 - 31.8 %   UIBC 198 ug/dL    Comment: Performed at [facility], [address] [place], [place] [address]  Ferritin     Status: None   Collection Time: 04/21/24  7:38 PM  Result Value Ref Range   Ferritin 20 11 - 307 ng/mL    Comment: Performed at [facility], [address] [place], [place] [address]  Basic metabolic panel with GFR     Status: Abnormal   Collection Time: 04/22/24  4:04 AM  Result Value Ref Range   Sodium 136 135 - 145 mmol/L   Potassium 3.9 3.5 - 5.1 mmol/L   Chloride 104 98 - 111 mmol/L   CO2 24 22 - 32 mmol/L   Glucose, Bld 80 70 - 99 mg/dL    Comment: Glucose reference range applies only to samples taken after fasting for at least 8 hours.   BUN 14 8 - 23 mg/dL   Creatinine, Ser 9.06 0.44 - 1.00 mg/dL   Calcium  8.3 (L) 8.9 - 10.3 mg/dL   GFR, Estimated 60 (L) >60 mL/min    Comment: (NOTE) Calculated using the CKD-EPI Creatinine Equation (2021)    Anion gap 8 5 - 15    Comment: Performed at [facility], [address] [place], [place] [address]  CBC     Status: Abnormal   Collection Time:  04/22/24  4:04 AM  Result Value Ref Range   WBC 6.1 4.0 - 10.5 K/uL   RBC 3.14 (L) 3.87 - 5.11 MIL/uL   Hemoglobin 7.7 (L) 12.0 - 15.0 g/dL   HCT 75.2 (L) 63.9 - 53.9 %   MCV 78.7 (L) 80.0 - 100.0 fL   MCH 24.5 (L) 26.0 - 34.0 pg   MCHC 31.2 30.0 - 36.0 g/dL   RDW 85.4 88.4 - 84.4 %   Platelets 462 (H) 150 - 400 K/uL   nRBC 0.0 0.0 - 0.2 %    Comment: Performed at [facility], [address], [place], [place] [address]  Magnesium     Status: None   Collection Time: 04/22/24  4:04 AM  Result Value Ref Range   Magnesium 1.8 1.7 - 2.4 mg/dL    Comment: Performed at [facility], [address] [place], [place] [address]      Assessment & Plan:  Cardiology consult. Continue rest of meds. Monitor BP. Problem List Items Addressed This Visit     Anemia   Relevant Orders   CBC with Diff   RLS (restless legs syndrome)   Gastroesophageal reflux disease without esophagitis   Venous stasis ulcers of both lower extremities (HCC) - Primary   Other Visit Diagnoses       Frequent PVCs       Relevant Orders   EKG 12-Lead   Basic metabolic panel with GFR   Magnesium       Follow up in 1 month.  Total time spent: 30 minutes. This time includes review of previous notes and results and patient face to face interaction during today's visit.    FERNAND FREDY RAMAN, MD  05/06/2024   This document may have been  prepared by [employer] and as such may include unintentional dictation errors.

## 2024-05-06 NOTE — Progress Notes (Signed)
 Cardiology Office Note   Date:  05/06/2024   ID:  Patricia Knox, DOB Jan 23, 1936, MRN [id number]  PCP:  Fernand Fredy RAMAN, MD  Cardiologist:  Denyse Fernand, MD      History of Present Illness: Patricia Knox is a 88 y.o. female who presents for  Chief Complaint  Patient presents with   Acute Visit    Abnormal EKG    88 YOWFpresent for evaluation with SOB, swelling of legs and DVT, with recent admission for Pulmonary embolism 04/21/24. Her EKG in [facility] had NSR and today is in [facility] fib.      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   COLONOSCOPY  2002   TOE SURGERY  1999     Current Outpatient Medications  Medication Sig Dispense Refill   acetaminophen  (TYLENOL ) 500 MG tablet Take 2 tablets (1,000 mg total) by mouth 3 (three) times daily as needed for mild pain (pain score 1-3), moderate pain (pain score 4-6) or headache.     alendronate (FOSAMAX) 70 MG tablet Take 1 tablet by mouth once a week 12 tablet 3   amiodarone  (PACERONE ) 200 MG tablet Take 4 tablets/day for 1 week, then 3 tablets/day for 1  week, then 2 tablets/day for 1 week. Then follow up. 70 tablet 0   apixaban  (ELIQUIS ) 5 MG TABS tablet Take 1 tablet (5 mg total) by mouth 2 (two) times daily. Home med.     atorvastatin  (LIPITOR) 40 MG tablet TAKE 1 TABLET BY MOUTH ONCE DAILY AT BEDTIME 90 tablet 3   Calcium  Carbonate-Vitamin D (CALTRATE 600+D PO) Take 1 capsule by mouth 2 (two) times daily.     furosemide  (LASIX ) 20 MG tablet Take 1 tablet (20 mg total) by mouth daily. 30 tablet 11   gabapentin  (NEURONTIN ) 100 MG capsule TAKE 3 CAPSULES BY MOUTH AT BEDTIME 270 capsule 0   hydrocortisone 2.5 % cream Apply 1 Application topically 2 (two) times a week.     ketoconazole (NIZORAL) 2 % cream Apply 1 Application topically 2 (two) times daily.     omeprazole (PRILOSEC) 20 MG capsule Take 1 capsule by mouth twice daily 180 capsule 3   oxyCODONE  (OXY IR/ROXICODONE ) 5 MG immediate release tablet Take 1 tablet (5 mg total) by mouth every 6 (six) hours as needed for severe pain (pain score 7-10). 10 tablet 0   rOPINIRole  (REQUIP ) 1 MG tablet Take 1 tablet by mouth twice daily 180 tablet 0   spironolactone  (ALDACTONE ) 25 MG tablet Take 1  tablet by mouth once daily 90 tablet 3   traMADol  (ULTRAM ) 50 MG tablet Take 1 tablet (50 mg total) by mouth every 6 (six) hours as needed. 20 tablet 0   traZODone  (DESYREL ) 100 MG tablet TAKE 1 TABLET BY MOUTH AT BEDTIME AS NEEDED FOR INSOMNIA 90 tablet 0   triamcinolone  (KENALOG ) 0.025 % cream Apply 1 Application topically daily.     No current facility-administered medications for this visit.    Allergies:   Patient has no known allergies.    Social History:   reports that she has never smoked. She has never used smokeless tobacco. She reports that she does not drink alcohol and does not use drugs.   Family History:  family history includes Heart attack in her father; Stroke in her mother.    ROS:     Review of Systems  Constitutional: Negative.   HENT: Negative.    Eyes: Negative.    Respiratory: Negative.    Gastrointestinal: Negative.   Genitourinary: Negative.   Musculoskeletal: Negative.   Skin: Negative.   Neurological: Negative.   Endo/Heme/Allergies: Negative.   Psychiatric/Behavioral: Negative.    All other systems reviewed and are negative.     All other systems are reviewed and negative.    PHYSICAL EXAM: VS:  BP 122/84   Pulse 62   Ht 5' 2 (1.575 m)   Wt 132 lb (59.9 kg)   SpO2 99%   BMI 24.14 kg/m  , BMI Body mass index is 24.14 kg/m. Last weight:  Wt Readings from Last 3 Encounters:  05/06/24 132 lb (59.9 kg)  05/06/24 132 lb 3.2 oz (60 kg)  05/03/24 130 lb 12.8 oz (59.3 kg)     Physical Exam Constitutional:      Appearance: Normal appearance.  Cardiovascular:     Rate and Rhythm: Normal rate and regular rhythm.     Heart sounds: Normal heart sounds.  Pulmonary:     Effort: Pulmonary effort is normal.     Breath sounds: Normal breath sounds.  Musculoskeletal:     Right lower leg: No edema.     Left lower leg: No edema.  Neurological:     Mental Status: She is alert.       EKG: atrial fib 90/min LVH, nonspecific st changes, new since was in NSR 04/21/24  Recent Labs: 09/10/2023: ALT 15 04/21/2024: Pro Brain Natriuretic Peptide 851.0 04/22/2024: BUN 14; Creatinine, Ser 0.93; Hemoglobin 7.7; Magnesium 1.8; Platelets 462; Potassium 3.9; Sodium 136    Lipid Panel    Component Value Date/Time   CHOL 154 09/10/2023 1107   TRIG 95 09/10/2023 1107   HDL 61 09/10/2023 1107   LDLCALC 76 09/10/2023 1107      Other studies Reviewed: Additional studies/ records that were reviewed today include:  Review of the above records demonstrates:       No data to display            ASSESSMENT AND PLAN:    ICD-10-CM   1. New onset atrial fibrillation (HCC)  I48.91 PCV ECHOCARDIOGRAM COMPLETE    MYOCARDIAL PERFUSION IMAGING    amiodarone  (PACERONE ) 200 MG tablet   Afib 90/min LVH, non specific st changes, new since  04/21/24, start amiodrone, continue elliques, stop asp as has anemia and do echo, stress tes to r/o CAD.    2. Mixed hyperlipidemia  E78.2 PCV ECHOCARDIOGRAM COMPLETE    MYOCARDIAL PERFUSION IMAGING    amiodarone  (PACERONE ) 200 MG tablet    3. Essential  hypertension, benign  I10 PCV ECHOCARDIOGRAM COMPLETE    MYOCARDIAL PERFUSION IMAGING    amiodarone  (PACERONE ) 200 MG tablet    4. Venous stasis ulcers of both lower extremities (HCC)  I83.019 PCV ECHOCARDIOGRAM COMPLETE   I83.029 MYOCARDIAL PERFUSION IMAGING   L97.919 amiodarone  (PACERONE ) 200 MG tablet   L97.929     5. Frequent PVCs  I49.3 PCV ECHOCARDIOGRAM COMPLETE    MYOCARDIAL PERFUSION IMAGING    amiodarone  (PACERONE ) 200 MG tablet    6. Acute deep vein thrombosis (DVT) of femoral vein of left lower extremity (HCC)  I82.412 PCV ECHOCARDIOGRAM COMPLETE    MYOCARDIAL PERFUSION IMAGING    amiodarone  (PACERONE ) 200 MG tablet    7. Acute pulmonary embolism without acute cor pulmonale, unspecified pulmonary embolism type (HCC)  I26.99 PCV ECHOCARDIOGRAM COMPLETE    MYOCARDIAL PERFUSION IMAGING    amiodarone  (PACERONE ) 200 MG tablet    8. Multiple subsegmental pulmonary emboli without acute cor pulmonale (HCC)  I26.94 PCV ECHOCARDIOGRAM COMPLETE    MYOCARDIAL PERFUSION IMAGING    amiodarone  (PACERONE ) 200 MG tablet    9. Anemia, unspecified type  D64.9 PCV ECHOCARDIOGRAM COMPLETE    MYOCARDIAL PERFUSION IMAGING    amiodarone  (PACERONE ) 200 MG tablet    10. SOB (shortness of breath)  R06.02 PCV ECHOCARDIOGRAM COMPLETE    MYOCARDIAL PERFUSION IMAGING    amiodarone  (PACERONE ) 200 MG tablet    11. Nonrheumatic mitral valve regurgitation  I34.0 PCV ECHOCARDIOGRAM COMPLETE    MYOCARDIAL PERFUSION IMAGING    amiodarone  (PACERONE ) 200 MG tablet   mild       Problem List Items Addressed This Visit       Cardiovascular and Mediastinum   Essential hypertension, benign   Relevant Medications   amiodarone  (PACERONE ) 200 MG  tablet   Other Relevant Orders   PCV ECHOCARDIOGRAM COMPLETE   MYOCARDIAL PERFUSION IMAGING   Acute pulmonary embolism (HCC)   Relevant Medications   amiodarone  (PACERONE ) 200 MG tablet   Other Relevant Orders   PCV ECHOCARDIOGRAM COMPLETE   MYOCARDIAL PERFUSION IMAGING   Multiple subsegmental pulmonary emboli without acute cor pulmonale (HCC)   Relevant Medications   amiodarone  (PACERONE ) 200 MG tablet   Other Relevant Orders   PCV ECHOCARDIOGRAM COMPLETE   MYOCARDIAL PERFUSION IMAGING   Acute deep vein thrombosis (DVT) of femoral vein of left lower extremity (HCC)   Relevant Medications   amiodarone  (PACERONE ) 200 MG tablet   Other Relevant Orders   PCV ECHOCARDIOGRAM COMPLETE   MYOCARDIAL PERFUSION IMAGING     Musculoskeletal and Integument   Venous stasis ulcers of both lower extremities (HCC)   Relevant Medications   amiodarone  (PACERONE ) 200 MG tablet   Other Relevant Orders   PCV ECHOCARDIOGRAM COMPLETE   MYOCARDIAL PERFUSION IMAGING     Other   Hyperlipidemia   Relevant Medications   amiodarone  (PACERONE ) 200 MG tablet   Other Relevant Orders   PCV ECHOCARDIOGRAM COMPLETE   MYOCARDIAL PERFUSION IMAGING   Anemia   Relevant Medications   amiodarone  (PACERONE ) 200 MG tablet   Other Relevant Orders   PCV ECHOCARDIOGRAM COMPLETE   MYOCARDIAL PERFUSION IMAGING   Other Visit Diagnoses       New onset atrial fibrillation (HCC)    -  Primary   Afib 90/min LVH, non specific st changes, new since 04/21/24, start amiodrone, continue elliques, stop asp as has anemia and do echo, stress tes to r/o CAD.   Relevant Medications   amiodarone  (PACERONE ) 200 MG tablet  Other Relevant Orders   PCV ECHOCARDIOGRAM COMPLETE   MYOCARDIAL PERFUSION IMAGING     Frequent PVCs       Relevant Medications   amiodarone  (PACERONE ) 200 MG tablet   Other Relevant Orders   PCV ECHOCARDIOGRAM COMPLETE   MYOCARDIAL PERFUSION IMAGING     SOB (shortness of breath)       Relevant  Medications   amiodarone  (PACERONE ) 200 MG tablet   Other Relevant Orders   PCV ECHOCARDIOGRAM COMPLETE   MYOCARDIAL PERFUSION IMAGING     Nonrheumatic mitral valve regurgitation       mild   Relevant Medications   amiodarone  (PACERONE ) 200 MG tablet   Other Relevant Orders   PCV ECHOCARDIOGRAM COMPLETE   MYOCARDIAL PERFUSION IMAGING          Disposition:   Return in about 3 weeks (around 05/27/2024) for echo, stress test and f/u.    Total time spent: 50 minutes  Signed,  Denyse Bathe, MD  05/06/2024 3:10 PM    [facility]

## 2024-05-07 LAB — CBC WITH DIFFERENTIAL/PLATELET
Basophils Absolute: 0 x10E3/uL (ref 0.0–0.2)
Basos: 1 %
EOS (ABSOLUTE): 0.1 x10E3/uL (ref 0.0–0.4)
Eos: 1 %
Hematocrit: 31.9 % — ABNORMAL LOW (ref 34.0–46.6)
Hemoglobin: 9.6 g/dL — ABNORMAL LOW (ref 11.1–15.9)
Immature Grans (Abs): 0 x10E3/uL (ref 0.0–0.1)
Immature Granulocytes: 0 %
Lymphocytes Absolute: 2 x10E3/uL (ref 0.7–3.1)
Lymphs: 28 %
MCH: 24.6 pg — ABNORMAL LOW (ref 26.6–33.0)
MCHC: 30.1 g/dL — ABNORMAL LOW (ref 31.5–35.7)
MCV: 82 fL (ref 79–97)
Monocytes Absolute: 0.7 x10E3/uL (ref 0.1–0.9)
Monocytes: 10 %
Neutrophils Absolute: 4.4 x10E3/uL (ref 1.4–7.0)
Neutrophils: 60 %
Platelets: 553 x10E3/uL — ABNORMAL HIGH (ref 150–450)
RBC: 3.9 x10E6/uL (ref 3.77–5.28)
RDW: 15.2 % (ref 11.7–15.4)
WBC: 7.3 x10E3/uL (ref 3.4–10.8)

## 2024-05-07 LAB — BASIC METABOLIC PANEL WITH GFR
BUN/Creatinine Ratio: 9 — ABNORMAL LOW (ref 12–28)
BUN: 13 mg/dL (ref 8–27)
CO2: 23 mmol/L (ref 20–29)
Calcium: 9.1 mg/dL (ref 8.7–10.3)
Chloride: 101 mmol/L (ref 96–106)
Creatinine, Ser: 1.38 mg/dL — ABNORMAL HIGH (ref 0.57–1.00)
Glucose: 126 mg/dL — ABNORMAL HIGH (ref 70–99)
Potassium: 3.4 mmol/L — ABNORMAL LOW (ref 3.5–5.2)
Sodium: 138 mmol/L (ref 134–144)
eGFR: 37 mL/min/1.73 — ABNORMAL LOW (ref >59)

## 2024-05-07 LAB — MAGNESIUM: Magnesium: 1.9 mg/dL (ref 1.6–2.3)

## 2024-05-09 ENCOUNTER — Ambulatory Visit: Payer: Self-pay | Admitting: Internal Medicine

## 2024-05-09 ENCOUNTER — Other Ambulatory Visit: Payer: Self-pay | Admitting: Internal Medicine

## 2024-05-09 DIAGNOSIS — D508 Other iron deficiency anemias: Secondary | ICD-10-CM

## 2024-05-09 DIAGNOSIS — E876 Hypokalemia: Secondary | ICD-10-CM

## 2024-05-09 DIAGNOSIS — D75839 Thrombocytosis, unspecified: Secondary | ICD-10-CM

## 2024-05-09 MED ORDER — POTASSIUM CHLORIDE CRYS ER 20 MEQ PO TBCR: 20.0000 meq | EXTENDED_RELEASE_TABLET | Freq: Two times a day (BID) | ORAL | 0 refills | Status: AC

## 2024-05-10 ENCOUNTER — Institutional Professional Consult (permissible substitution): Admitting: Cardiovascular Disease

## 2024-05-11 ENCOUNTER — Inpatient Hospital Stay: Attending: Oncology | Admitting: Oncology

## 2024-05-11 ENCOUNTER — Inpatient Hospital Stay

## 2024-05-11 ENCOUNTER — Encounter: Admitting: Internal Medicine

## 2024-05-11 ENCOUNTER — Other Ambulatory Visit: Payer: Self-pay | Admitting: Internal Medicine

## 2024-05-11 ENCOUNTER — Encounter: Payer: Self-pay | Admitting: Oncology

## 2024-05-11 ENCOUNTER — Ambulatory Visit: Payer: Self-pay | Admitting: Oncology

## 2024-05-11 VITALS — BP 150/100 | HR 55 | Temp 97.5°F | Resp 18 | Wt 135.2 lb

## 2024-05-11 DIAGNOSIS — D509 Iron deficiency anemia, unspecified: Secondary | ICD-10-CM | POA: Insufficient documentation

## 2024-05-11 DIAGNOSIS — G2581 Restless legs syndrome: Secondary | ICD-10-CM | POA: Insufficient documentation

## 2024-05-11 DIAGNOSIS — Z79899 Other long term (current) drug therapy: Secondary | ICD-10-CM | POA: Insufficient documentation

## 2024-05-11 DIAGNOSIS — Z8679 Personal history of other diseases of the circulatory system: Secondary | ICD-10-CM | POA: Diagnosis not present

## 2024-05-11 DIAGNOSIS — L97909 Non-pressure chronic ulcer of unspecified part of unspecified lower leg with unspecified severity: Secondary | ICD-10-CM

## 2024-05-11 DIAGNOSIS — D75839 Thrombocytosis, unspecified: Secondary | ICD-10-CM | POA: Insufficient documentation

## 2024-05-11 DIAGNOSIS — Z86711 Personal history of pulmonary embolism: Secondary | ICD-10-CM | POA: Diagnosis not present

## 2024-05-11 DIAGNOSIS — Z86718 Personal history of other venous thrombosis and embolism: Secondary | ICD-10-CM | POA: Insufficient documentation

## 2024-05-11 DIAGNOSIS — D649 Anemia, unspecified: Secondary | ICD-10-CM

## 2024-05-11 DIAGNOSIS — Z7901 Long term (current) use of anticoagulants: Secondary | ICD-10-CM | POA: Diagnosis not present

## 2024-05-11 DIAGNOSIS — I83009 Varicose veins of unspecified lower extremity with ulcer of unspecified site: Secondary | ICD-10-CM | POA: Insufficient documentation

## 2024-05-11 DIAGNOSIS — I87333 Chronic venous hypertension (idiopathic) with ulcer and inflammation of bilateral lower extremity: Secondary | ICD-10-CM | POA: Diagnosis not present

## 2024-05-11 DIAGNOSIS — I10 Essential (primary) hypertension: Secondary | ICD-10-CM

## 2024-05-11 LAB — CBC WITH DIFFERENTIAL/PLATELET
Abs Immature Granulocytes: 0.06 K/uL (ref 0.00–0.07)
Basophils Absolute: 0 K/uL (ref 0.0–0.1)
Basophils Relative: 1 %
Eosinophils Absolute: 0 K/uL (ref 0.0–0.5)
Eosinophils Relative: 0 %
HCT: 29.3 % — ABNORMAL LOW (ref 36.0–46.0)
Hemoglobin: 9 g/dL — ABNORMAL LOW (ref 12.0–15.0)
Immature Granulocytes: 1 %
Lymphocytes Relative: 18 %
Lymphs Abs: 1.6 K/uL (ref 0.7–4.0)
MCH: 24.7 pg — ABNORMAL LOW (ref 26.0–34.0)
MCHC: 30.7 g/dL (ref 30.0–36.0)
MCV: 80.5 fL (ref 80.0–100.0)
Monocytes Absolute: 0.8 K/uL (ref 0.1–1.0)
Monocytes Relative: 9 %
Neutro Abs: 6.4 K/uL (ref 1.7–7.7)
Neutrophils Relative %: 71 %
Platelets: 489 K/uL — ABNORMAL HIGH (ref 150–400)
RBC: 3.64 MIL/uL — ABNORMAL LOW (ref 3.87–5.11)
RDW: 17.1 % — ABNORMAL HIGH (ref 11.5–15.5)
WBC: 8.8 K/uL (ref 4.0–10.5)
nRBC: 0 % (ref 0.0–0.2)

## 2024-05-11 LAB — IRON AND TIBC
Iron: 27 ug/dL — ABNORMAL LOW (ref 28–170)
Saturation Ratios: 8 % — ABNORMAL LOW (ref 10.4–31.8)
TIBC: 336 ug/dL (ref 250–450)
UIBC: 309 ug/dL

## 2024-05-11 LAB — LACTATE DEHYDROGENASE: LDH: 197 U/L (ref 105–235)

## 2024-05-11 LAB — RETIC PANEL
Immature Retic Fract: 36.2 % — ABNORMAL HIGH (ref 2.3–15.9)
RBC.: 3.64 MIL/uL — ABNORMAL LOW (ref 3.87–5.11)
Retic Count, Absolute: 48.8 K/uL (ref 19.0–186.0)
Retic Ct Pct: 1.3 % (ref 0.4–3.1)
Reticulocyte Hemoglobin: 26.4 pg — ABNORMAL LOW (ref >27.9)

## 2024-05-11 LAB — FERRITIN: Ferritin: 28 ng/mL (ref 11–307)

## 2024-05-11 MED ORDER — VITAMIN B-12 1000 MCG PO TABS: 1000.0000 ug | ORAL_TABLET | Freq: Every day | ORAL | 0 refills | Status: AC

## 2024-05-11 NOTE — Assessment & Plan Note (Signed)
 On Elqiuis for anticoagulation

## 2024-05-11 NOTE — Assessment & Plan Note (Addendum)
 Labs are reviewed and discussed with patient. Lab Results  Component Value Date   HGB 9.0 (L) 05/11/2024   TIBC 336 05/11/2024   IRONPCTSAT 8 (L) 05/11/2024   FERRITIN 28 05/11/2024    Today's blood work result is consistent with iron deficiency anemia.  I discussed about option IV Venofer treatments. I discussed about the potential risks including but not limited to allergic reactions/infusion reactions including anaphylactic reactions, diarrhea, phlebitis, high blood pressure, wheezing, SOB, skin rash, weight gain,dark urine, leg swelling, back pain, headache, nausea and fatigue, etc. Patient and daughter agree with the plan Plan IV venofer weekly x 4   Recommend patient to follow up with GI for evaluation of IDA

## 2024-05-11 NOTE — Progress Notes (Signed)
 Hematology/Oncology Consult note [phone] [phone]        REFERRING PROVIDER: Therisa Bi, MD   CHIEF COMPLAINTS/REASON FOR VISIT:  Evaluation of IDA   ASSESSMENT & PLAN:   Iron deficiency anemia Labs are reviewed and discussed with patient. Lab Results  Component Value Date   HGB 9.0 (L) 05/11/2024   TIBC 336 05/11/2024   IRONPCTSAT 8 (L) 05/11/2024   FERRITIN 28 05/11/2024    Today's blood work result is consistent with iron deficiency anemia.  I discussed about option IV Venofer treatments. I discussed about the potential risks including but not limited to allergic reactions/infusion reactions including anaphylactic reactions, diarrhea, phlebitis, high blood pressure, wheezing, SOB, skin rash, weight gain,dark urine, leg swelling, back pain, headache, nausea and fatigue, etc. Patient and daughter agree with the plan Plan IV venofer weekly x 4   Recommend patient to follow up with GI for evaluation of IDA  Personal history of venous thrombosis and embolism On Elqiuis for anticoagulation  Venous stasis ulcer (HCC) Follow up with vascular surgeon and wound care  RLS (restless legs syndrome) Recommend to increase iron store, aim ferritin >= 75  Thrombocytosis Likely reactive due to lower extremity ulcer/inflammation and IDA monitor   Orders Placed This Encounter  Procedures   Ferritin    Standing Status:   Future    Number of Occurrences:   1    Expected Date:   05/11/2024    Expiration Date:   08/09/2024   Iron and TIBC    Standing Status:   Future    Number of Occurrences:   1    Expected Date:   05/11/2024    Expiration Date:   08/09/2024   CBC with Differential/Platelet    Standing Status:   Future    Number of Occurrences:   1    Expected Date:   05/11/2024    Expiration Date:   08/09/2024   Retic Panel    Standing Status:   Future    Number of Occurrences:   1    Expected Date:   05/11/2024    Expiration Date:   08/09/2024    Lactate dehydrogenase    Standing Status:   Future    Number of Occurrences:   1    Expected Date:   05/11/2024    Expiration Date:   08/09/2024   Haptoglobin    Standing Status:   Future    Number of Occurrences:   1    Expected Date:   05/11/2024    Expiration Date:   08/09/2024   Multiple Myeloma Panel (SPEP&IFE w/QIG)    Standing Status:   Future    Number of Occurrences:   1    Expected Date:   05/11/2024    Expiration Date:   08/09/2024   Kappa/lambda light chains    Standing Status:   Future    Number of Occurrences:   1    Expected Date:   05/11/2024    Expiration Date:   08/09/2024   Miscellaneous [facility] test (send-out)    Standing Status:   Future    Number of Occurrences:   1    Expected Date:   05/11/2024    Expiration Date:   05/11/2025    Test name / description::   Soluble transferrin receptor [facility] [id number]   Follow up in 3 months All questions were answered. The patient knows to call the clinic with any problems, questions or concerns.  Patricia Cap, MD,  PhD [facility] Hematology Oncology 05/11/2024   HISTORY OF PRESENTING ILLNESS:   Patricia Knox is a  88 y.o.  female with PMH listed below was seen in consultation at the request of  Patricia Bi, MD  for evaluation of iron deficiency anemia.   Discussed the use of AI scribe software for clinical note transcription with the patient, who gave verbal consent to proceed.   04/21/2024 - 04/22/2024 Patient was hospitalized due to lower extremity pan and swelling due to vein insufficiency and ulcer. She was found to have hemoglobin of 6.8 g/dL, for which she received one unit of packed red blood cells. Subsequent testing showed hemoglobin improved to 7.7 g/dL. She has not taken iron supplementation. Iron studies performed during the hospitalization demonstrated normal ferritin and elevated iron saturation, findings atypical for iron deficiency anemia. Vitamin B12 levels were within normal limits but not optimal, and she  is uncertain if she is currently taking B12 supplementation.  She has chronic venous insufficiency with persistent leg ulcers and pain for the past six to seven weeks, currently managed by a vascular specialist with ongoing wound care. Plans for laser ablation of the veins are pending wound healing. She presented to the hospital in November due to severe leg pain from these ulcers.  She has history of PE/DVT, currently on anticoagulation Eliquis  5mg  BID Restless syndrome.  Daughter reports history of melena. No rectal bleeding.  She has a history of atypical lobular hyperplasia s/p resection in 2017  She feels fatigued.    MEDICAL HISTORY:  Past Medical History:  Diagnosis Date   Atypical lobular hyperplasia (ALH) of breast 2017   papilloma   GERD (gastroesophageal reflux disease)    Heart murmur    Hemifacial spasm    Left    History of pulmonary embolus (PE)    Hyperlipidemia    Hypertension    Restless leg syndrome     SURGICAL HISTORY: Past Surgical History:  Procedure Laterality Date   ABDOMINAL HYSTERECTOMY     BREAST BIOPSY Left 07/08/2013   NEG - US  biopsy   BREAST BIOPSY Left 01/30/2016   Stereo - Intraductal papilloma and focal atypical lobular hyperplasia   BREAST BIOPSY Left 02/10/2018   Affim Biopsy-(calcs/X clip) neg   BREAST EXCISIONAL BIOPSY     BREAST LUMPECTOMY Left 03/14/2016   Procedure: BREAST LUMPECTOMY;  Surgeon: Patricia LELON Cota, MD;  Location: [facility] ORS;  Service: General;  Laterality: Left;   BREAST SURGERY Left 06/2013   Pathology as noted above 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   COLONOSCOPY  2002   TOE SURGERY  1999    SOCIAL HISTORY: Social History   Socioeconomic History   Marital status: Single    Spouse name: Not on file   Number of children: 3   Years of education: HS   Highest education level: Not on file  Occupational History   Occupation: Retired  Tobacco Use   Smoking status: Never   Smokeless  tobacco: Never  Substance and Sexual Activity   Alcohol use: No   Drug use: No   Sexual activity: Not on file  Other Topics Concern   Not on file  Social History Narrative   Lives at home with husband.   Right-handed.   No daily caffeine use.   Social Drivers of Corporate Investment Banker Strain: Low Risk  (05/04/2024)   Received from [facility]   Overall Financial Resource Strain (CARDIA)    Difficulty  of Paying Living Expenses: Not hard at all  Food Insecurity: No Food Insecurity (05/11/2024)   Hunger Vital Sign    Worried About Running Out of Food in the Last Year: Never true    Ran Out of Food in the Last Year: Never true  Transportation Needs: No Transportation Needs (05/11/2024)   PRAPARE - Administrator, Civil Service (Medical): No    Lack of Transportation (Non-Medical): No  Physical Activity: Not on file  Stress: Not on file  Social Connections: Unknown (04/21/2024)   Social Connection and Isolation Panel    Frequency of Communication with Friends and Family: More than three times a week    Frequency of Social Gatherings with Friends and Family: More than three times a week    Attends Religious Services: Patient declined    Database Administrator or Organizations: Patient declined    Attends Banker Meetings: Patient declined    Marital Status: Widowed  Intimate Partner Violence: Not At Risk (05/11/2024)   Humiliation, Afraid, Rape, and Kick questionnaire    Fear of Current or Ex-Partner: No    Emotionally Abused: No    Physically Abused: No    Sexually Abused: No    FAMILY HISTORY: Family History  Problem Relation Age of Onset   Stroke Mother    Heart attack Father    Breast cancer Neg Hx     ALLERGIES:  has no known allergies.  MEDICATIONS:  Current Outpatient Medications  Medication Sig Dispense Refill   acetaminophen  (TYLENOL ) 500 MG tablet Take 2 tablets (1,000 mg total) by mouth 3 (three) times daily  as needed for mild pain (pain score 1-3), moderate pain (pain score 4-6) or headache.     alendronate (FOSAMAX) 70 MG tablet Take 1 tablet by mouth once a week 12 tablet 3   amiodarone  (PACERONE ) 200 MG tablet Take 4 tablets/day for 1 week, then 3 tablets/day for 1 week, then 2 tablets/day for 1 week. Then follow up. 70 tablet 0   amLODipine  (NORVASC ) 5 MG tablet Take 1 tablet by mouth once daily 90 tablet 3   apixaban  (ELIQUIS ) 5 MG TABS tablet Take 1 tablet (5 mg total) by mouth 2 (two) times daily. Home med.     atorvastatin  (LIPITOR) 40 MG tablet TAKE 1 TABLET BY MOUTH ONCE DAILY AT BEDTIME 90 tablet 3   Calcium  Carbonate-Vitamin D (CALTRATE 600+D PO) Take 1 capsule by mouth 2 (two) times daily.     cyanocobalamin  (VITAMIN B12) 1000 MCG tablet Take 1 tablet (1,000 mcg total) by mouth daily. 90 tablet 0   furosemide  (LASIX ) 20 MG tablet Take 1 tablet (20 mg total) by mouth daily. 30 tablet 11   gabapentin  (NEURONTIN ) 100 MG capsule TAKE 3 CAPSULES BY MOUTH AT BEDTIME 270 capsule 0   hydrocortisone 2.5 % cream Apply 1 Application topically 2 (two) times a week.     ketoconazole (NIZORAL) 2 % cream Apply 1 Application topically 2 (two) times daily.     omeprazole (PRILOSEC) 20 MG capsule Take 1 capsule by mouth twice daily 180 capsule 3   oxyCODONE  (OXY IR/ROXICODONE ) 5 MG immediate release tablet Take 1 tablet (5 mg total) by mouth every 6 (six) hours as needed for severe pain (pain score 7-10). 10 tablet 0   potassium chloride  SA (KLOR-CON  M) 20 MEQ tablet Take 1 tablet (20 mEq total) by mouth 2 (two) times daily for 3 days. 6 tablet 0   rOPINIRole  (REQUIP ) 1 MG  tablet Take 1 tablet by mouth twice daily 180 tablet 0   spironolactone  (ALDACTONE ) 25 MG tablet Take 1 tablet by mouth once daily 90 tablet 3   traMADol  (ULTRAM ) 50 MG tablet Take 1 tablet (50 mg total) by mouth every 6 (six) hours as needed. 20 tablet 0   traZODone  (DESYREL ) 100 MG tablet TAKE 1 TABLET BY MOUTH AT BEDTIME AS NEEDED FOR  INSOMNIA 90 tablet 0   triamcinolone  (KENALOG ) 0.025 % cream Apply 1 Application topically daily.     No current facility-administered medications for this visit.    Review of Systems  Constitutional:  Positive for fatigue. Negative for appetite change, chills and fever.  HENT:   Negative for hearing loss and voice change.   Eyes:  Negative for eye problems.  Respiratory:  Negative for chest tightness and cough.   Cardiovascular:  Negative for chest pain.  Gastrointestinal:  Negative for abdominal distention, abdominal pain, blood in stool and nausea.       Dark stool  Endocrine: Negative for hot flashes.  Genitourinary:  Negative for difficulty urinating and frequency.   Musculoskeletal:  Negative for arthralgias.  Skin:  Negative for itching and rash.  Neurological:  Negative for extremity weakness.  Hematological:  Negative for adenopathy.  Psychiatric/Behavioral:  Negative for confusion.    PHYSICAL EXAMINATION:  Vitals:   05/11/24 0206 05/11/24 1401  BP: (!) 152/72 (!) 150/100  Pulse:  (!) 55  Resp:  18  Temp:  (!) 97.5 F (36.4 C)   Filed Weights   05/11/24 1401  Weight: 135 lb 3.2 oz (61.3 kg)    Physical Exam  LABORATORY DATA:  I have reviewed the data as listed    Latest Ref Rng & Units 05/11/2024    3:08 PM 05/06/2024    3:15 PM 04/22/2024    4:04 AM  CBC  WBC 4.0 - 10.5 K/uL 8.8  7.3  6.1   Hemoglobin 12.0 - 15.0 g/dL 9.0  9.6  7.7   Hematocrit 36.0 - 46.0 % 29.3  31.9  24.7   Platelets 150 - 400 K/uL 489  553  462       Latest Ref Rng & Units 05/06/2024    3:15 PM 04/22/2024    4:04 AM 04/21/2024   10:33 AM  CMP  Glucose 70 - 99 mg/dL 873  80  845   BUN 8 - 27 mg/dL 13  14  19    Creatinine 0.57 - 1.00 mg/dL 8.61  9.06  8.86   Sodium 134 - 144 mmol/L 138  136  137   Potassium 3.5 - 5.2 mmol/L 3.4  3.9  3.3   Chloride 96 - 106 mmol/L 101  104  104   CO2 20 - 29 mmol/L 23  24  22    Calcium  8.7 - 10.3 mg/dL 9.1  8.3  8.6       RADIOGRAPHIC  STUDIES: I have personally reviewed the radiological images as listed and agreed with the findings in the report. VAS US  LOWER EXTREMITY VENOUS REFLUX Result Date: 05/04/2024  Lower Venous Reflux Study Patient Name:  Patricia Knox  Date of Exam:   05/03/2024 Medical Rec #: [id number]      Accession #:    [id number] Date of Birth: [birth year]     Patient Gender: F Patient Age:   9 years Exam Location:  [facility] Procedure:      VAS US  LOWER EXTREMITY VENOUS REFLUX Referring Phys: JASON DEW --------------------------------------------------------------------------------  Indications: Edema, Swelling, and ulceration.  Limitations: Bandages and Patient was very uncomfortable. Performing Technologist: Donnice Charnley RVT  Examination Guidelines: A complete evaluation includes B-mode imaging, spectral Doppler, color Doppler, and power Doppler as needed of all accessible portions of each vessel. Bilateral testing is considered an integral part of a complete examination. Limited examinations for reoccurring indications may be performed as noted. The reflux portion of the exam is performed with the patient in reverse Trendelenburg. Significant venous reflux is defined as >500 ms in the superficial venous system, and >1 second in the deep venous system.  Venous Reflux Times +--------------+---------+------+-----------+------------+--------+ RIGHT         Reflux NoRefluxReflux TimeDiameter cmsComments                         Yes                                  +--------------+---------+------+-----------+------------+--------+ CFV                     yes   >1 second                      +--------------+---------+------+-----------+------------+--------+ FV prox                 yes   >1 second                      +--------------+---------+------+-----------+------------+--------+ FV mid                  yes   >1 second                       +--------------+---------+------+-----------+------------+--------+ FV dist                 yes   >1 second                      +--------------+---------+------+-----------+------------+--------+ Popliteal               yes   >1 second                      +--------------+---------+------+-----------+------------+--------+ GSV at [facility]    no                            0.39             +--------------+---------+------+-----------+------------+--------+ GSV prox thighno                            0.25             +--------------+---------+------+-----------+------------+--------+ GSV mid thigh no                            0.26             +--------------+---------+------+-----------+------------+--------+ GSV dist thighno                            0.34             +--------------+---------+------+-----------+------------+--------+ GSV at knee   no  0.26             +--------------+---------+------+-----------+------------+--------+ GSV prox calf no                            0.26             +--------------+---------+------+-----------+------------+--------+ SSV at [facility]              yes    >500 ms      0.63             +--------------+---------+------+-----------+------------+--------+ SSV prox calf           yes    >500 ms      0.66             +--------------+---------+------+-----------+------------+--------+ SSV mid calf            yes    >500 ms      0.52             +--------------+---------+------+-----------+------------+--------+  +--------------+---------+------+-----------+------------+----------------+ LEFT          Reflux NoRefluxReflux TimeDiameter cmsComments                                 Yes                                          +--------------+---------+------+-----------+------------+----------------+ CFV                     yes   >1 second                               +--------------+---------+------+-----------+------------+----------------+ FV prox                 yes   >1 second                              +--------------+---------+------+-----------+------------+----------------+ FV mid                  yes   >1 second                              +--------------+---------+------+-----------+------------+----------------+ FV dist                 yes   >1 second                              +--------------+---------+------+-----------+------------+----------------+ Popliteal               yes   >1 second                              +--------------+---------+------+-----------+------------+----------------+ GSV at [facility]              yes    >500 ms      0.77    chronic thrombus +--------------+---------+------+-----------+------------+----------------+ GSV prox thigh          yes    >500 ms      0.57    chronic thrombus +--------------+---------+------+-----------+------------+----------------+ GSV  mid thigh           yes    >500 ms      0.58                     +--------------+---------+------+-----------+------------+----------------+ GSV dist thigh          yes    >500 ms      0.60    chronic thrombus +--------------+---------+------+-----------+------------+----------------+ GSV at knee             yes    >500 ms      0.56    chronic thrombus +--------------+---------+------+-----------+------------+----------------+ GSV prox calf           yes    >500 ms      0.88                     +--------------+---------+------+-----------+------------+----------------+ SSV at [facility]    no                            0.39                     +--------------+---------+------+-----------+------------+----------------+ SSV prox calf no                            0.47                     +--------------+---------+------+-----------+------------+----------------+ SSV mid calf            yes    >500 ms       0.32                     +--------------+---------+------+-----------+------------+----------------+   Summary: Right: - No evidence of deep vein thrombosis seen in the right lower extremity, from the common femoral through the popliteal veins. - No evidence of superficial venous thrombosis in the right lower extremity. - No evidence of superficial venous reflux seen in the right greater saphenous vein. - Venous reflux is noted in the right common femoral vein. - Venous reflux is noted in the right femoral vein. - Venous reflux is noted in the right popliteal vein. - Venous reflux is noted in the right short saphenous vein.  Left: - No evidence of deep vein thrombosis seen in the left lower extremity, from the common femoral through the popliteal veins. - Venous reflux is noted in the left common femoral vein. - Venous reflux is noted in the left sapheno-femoral junction. - Venous reflux is noted in the left greater saphenous vein in the thigh. - Venous reflux is noted in the left short saphenous vein. - Irregular anechoic structure seen in pop fossa consistent with previously documented ruptured Baker's cyst. Non occlusive, recanalized chronic thrombus seen throughout the GSV.  Comparisons: Distal tibial arterial waveforms are biphasic to triphasic bilaterally.  *See table(s) above for measurements and observations. Electronically signed by Selinda Gu MD on 05/04/2024 at 12:40:43 PM.    Final    US  ARTERIAL ABI (SCREENING LOWER EXTREMITY) Result Date: 04/22/2024 CLINICAL DATA:  Bilateral leg pain. EXAM: NONINVASIVE PHYSIOLOGIC VASCULAR STUDY OF BILATERAL LOWER EXTREMITIES TECHNIQUE: Evaluation of both lower extremities were performed at rest, including calculation of ankle-brachial indices with single level Doppler, pressure and pulse volume recording. COMPARISON:  None Available. FINDINGS: Right ABI:  1.83 Left ABI:  1.83 Right Lower Extremity: Irregular waveforms  but probable biphasic waveforms in the  dorsalis pedis artery. Left Lower Extremity: Irregular waveforms with some biphasic waveforms. 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 This is likely secondary to incompressible vessels. Electronically Signed   By: Juliene Balder M.D.   On: 04/22/2024 16:27   CT Angio Chest PE W and/or Wo Contrast Result Date: 04/21/2024 CLINICAL DATA:  Shortness of breath.  History of PE. EXAM: CT ANGIOGRAPHY CHEST WITH CONTRAST TECHNIQUE: Multidetector CT imaging of the chest was performed using the standard protocol during bolus administration of intravenous contrast. Multiplanar CT image reconstructions and MIPs were obtained to evaluate the vascular anatomy. RADIATION DOSE REDUCTION: This exam was performed according to the departmental dose-optimization program which includes automated exposure control, adjustment of the mA and/or kV according to patient size and/or use of iterative reconstruction technique. CONTRAST:  75mL OMNIPAQUE  IOHEXOL  350 MG/ML SOLN COMPARISON:  CT angiogram chest 07/23/2023 FINDINGS: Cardiovascular: There is adequate opacification of the pulmonary arteries to the segmental level. There is no evidence for pulmonary embolism. The ascending aorta is dilated measuring 4.1 cm. Heart is mildly enlarged. There are atherosclerotic calcifications of the aorta. There is no pericardial effusion. Mediastinum/Nodes: Bilateral thyroid nodules are again seen. The largest is left measuring 15 mm, unchanged. There some mild tracheal deviation to the right secondary to enlarged aorta is similar to prior. No enlarged lymph nodes are seen. There is a very large hiatal hernia with intrathoracic stomach is similar to prior. The stomach is nondilated Lungs/Pleura: There is a very small left pleural effusion which is new. Right pleural effusion has resolved. There is atelectasis in the bilateral  lower lobes. There some scattered peripheral scarring in both lungs. No pneumothorax. Upper Abdomen: There is a 2.3 cm cyst or hemangioma in the left lobe of the liver. There is a 14 mm cyst in the superior pole the left kidney. Musculoskeletal: Degenerative changes affect the spine. Review of the MIP images confirms the above findings. IMPRESSION: 1. No evidence for pulmonary embolism. 2. New very small left pleural effusion. 3. Stable very large hiatal hernia with intrathoracic stomach. 4. Aortic atherosclerosis. Aortic Atherosclerosis (ICD10-I70.0). Electronically Signed   By: Greig Pique M.D.   On: 04/21/2024 16:18   US  Venous Img Lower Bilateral Result Date: 04/21/2024 CLINICAL DATA:  BILATERAL lower extremity pain, greater on the RIGHT EXAM: Bilateral Lower Extremity Venous Doppler Ultrasound TECHNIQUE: Gray-scale sonography with compression, as well as color and duplex ultrasound, were performed to evaluate the deep venous system(s) from the level of the common femoral vein through the popliteal and proximal calf veins. COMPARISON:  07/23/2023 FINDINGS: VENOUS Normal compressibility of the common femoral, superficial femoral, and popliteal veins, as well as the visualized calf veins. Visualized portions of profunda femoral vein and great saphenous vein unremarkable. No filling defects to suggest DVT on grayscale or color Doppler imaging. Doppler waveforms show normal direction of venous flow, normal respiratory plasticity and response to augmentation. OTHER None. Limitations: none IMPRESSION: 1. No lower extremity DVT. 2. 3.3 x 1.1 x 2.8 cm fluid collection in the RIGHT popliteal fossa is likely a Baker's cyst. 3. 2.7 x 1.0 x 2.6 cm fluid collection in the LEFT popliteal fossa is likely a Baker's cyst. Electronically Signed   By: Aliene  Mir M.D.   On: 04/21/2024 14:06   DG Tibia/Fibula Right Result Date: 04/21/2024 EXAM: 2 VIEWS XRAY OF THE RIGHT TIBIA AND FIBULA 04/21/2024 12:15:13 PM COMPARISON:  None available. CLINICAL HISTORY: Evaluate for gas tracking, pain in leg. FINDINGS: BONES AND JOINTS: No acute fracture. No focal osseous lesion. No joint dislocation. SOFT TISSUES: The soft tissues are unremarkable. IMPRESSION: Electronically signed by: Lynwood Seip MD 04/21/2024 01:11 PM EST RP Workstation: [id number]   DG Chest 2 View Result Date: 04/21/2024 EXAM: 2 VIEW(S) XRAY OF THE CHEST 04/21/2024 12:15:13 PM COMPARISON: Prior study from 07/23/2023. CLINICAL HISTORY: shortness of breath FINDINGS: LUNGS AND PLEURA: No focal pulmonary opacity. No pleural effusion. No pneumothorax. HEART AND MEDIASTINUM: Stable cardiomegaly. BONES AND SOFT TISSUES: Large hiatal hernia is noted. No acute osseous abnormality. IMPRESSION: 1. Stable cardiomegaly. 2. Large hiatal hernia. Electronically signed by: Lynwood Seip MD 04/21/2024 01:10 PM EST RP Workstation: [id number]

## 2024-05-11 NOTE — Assessment & Plan Note (Signed)
 Follow up with vascular surgeon and wound care

## 2024-05-11 NOTE — Assessment & Plan Note (Signed)
 Recommend to increase iron store, aim ferritin >= 75

## 2024-05-11 NOTE — Assessment & Plan Note (Signed)
 Likely reactive due to lower extremity ulcer/inflammation and IDA monitor

## 2024-05-12 ENCOUNTER — Encounter: Payer: Self-pay | Admitting: Oncology

## 2024-05-12 LAB — HAPTOGLOBIN: Haptoglobin: 380 mg/dL — ABNORMAL HIGH (ref 41–333)

## 2024-05-12 LAB — MISC LABCORP TEST (SEND OUT): Labcorp test code: 143305

## 2024-05-12 LAB — KAPPA/LAMBDA LIGHT CHAINS
Kappa free light chain: 78 mg/L — ABNORMAL HIGH (ref 3.3–19.4)
Kappa, lambda light chain ratio: 1.72 — ABNORMAL HIGH (ref 0.26–1.65)
Lambda free light chains: 45.3 mg/L — ABNORMAL HIGH (ref 5.7–26.3)

## 2024-05-12 NOTE — Telephone Encounter (Signed)
-----   Message from [name], MD sent at 05/11/2024 10:15 PM EST ----- Please let patient's daughter know that today's blood work is consistent with IDA.  Recommend IV venofer weekly x 4.  Follow up 3 months lab prior to MD +/- venofer Labs are ordered ----- Message ----- From: Rebecka, Lab In [place] Sent: 05/11/2024   3:34 PM EST To: [name], MD

## 2024-05-12 NOTE — Telephone Encounter (Signed)
 Spoke to pt/ daughter and informed her of MD Recommendation and pt is agreeable. Pt transferred to [facility] and she has been scheduled.

## 2024-05-13 LAB — MULTIPLE MYELOMA PANEL, SERUM
Albumin SerPl Elph-Mcnc: 2.6 g/dL — ABNORMAL LOW (ref 2.9–4.4)
Albumin/Glob SerPl: 0.6 — ABNORMAL LOW (ref 0.7–1.7)
Alpha 1: 0.3 g/dL (ref 0.0–0.4)
Alpha2 Glob SerPl Elph-Mcnc: 1.1 g/dL — ABNORMAL HIGH (ref 0.4–1.0)
B-Globulin SerPl Elph-Mcnc: 1.2 g/dL (ref 0.7–1.3)
Gamma Glob SerPl Elph-Mcnc: 2.1 g/dL — ABNORMAL HIGH (ref 0.4–1.8)
Globulin, Total: 4.7 g/dL — ABNORMAL HIGH (ref 2.2–3.9)
IgA: 454 mg/dL — ABNORMAL HIGH (ref 64–422)
IgG (Immunoglobin G), Serum: 2023 mg/dL — ABNORMAL HIGH (ref 586–1602)
IgM (Immunoglobulin M), Srm: 74 mg/dL (ref 26–217)
Total Protein ELP: 7.3 g/dL (ref 6.0–8.5)

## 2024-05-16 ENCOUNTER — Encounter

## 2024-05-18 ENCOUNTER — Inpatient Hospital Stay

## 2024-05-18 ENCOUNTER — Encounter: Admitting: Internal Medicine

## 2024-05-18 DIAGNOSIS — I87333 Chronic venous hypertension (idiopathic) with ulcer and inflammation of bilateral lower extremity: Secondary | ICD-10-CM | POA: Diagnosis not present

## 2024-05-19 ENCOUNTER — Encounter

## 2024-05-23 ENCOUNTER — Encounter

## 2024-05-23 DIAGNOSIS — I87333 Chronic venous hypertension (idiopathic) with ulcer and inflammation of bilateral lower extremity: Secondary | ICD-10-CM | POA: Diagnosis not present

## 2024-05-24 ENCOUNTER — Inpatient Hospital Stay

## 2024-05-28 NOTE — OR Nursing (Signed)
 Pt daughter called, says prep was not called in to [facility], does not have prep.  Pt continued to take her Eliquis , last does 05/28/24 AM. Advised to call [facility] and r/s appt for colonoscopy. I will send a note to [facility] and move pt to depot for r/s

## 2024-05-30 ENCOUNTER — Ambulatory Visit: Admission: RE | Admit: 2024-05-30 | Admitting: Gastroenterology

## 2024-05-31 ENCOUNTER — Inpatient Hospital Stay

## 2024-05-31 ENCOUNTER — Encounter: Admitting: Physician Assistant

## 2024-05-31 DIAGNOSIS — I87333 Chronic venous hypertension (idiopathic) with ulcer and inflammation of bilateral lower extremity: Secondary | ICD-10-CM | POA: Diagnosis not present

## 2024-06-02 ENCOUNTER — Encounter: Payer: Self-pay | Admitting: Oncology

## 2024-06-03 ENCOUNTER — Ambulatory Visit (INDEPENDENT_AMBULATORY_CARE_PROVIDER_SITE_OTHER): Admitting: Vascular Surgery

## 2024-06-04 ENCOUNTER — Encounter: Payer: Self-pay | Admitting: Oncology

## 2024-06-06 ENCOUNTER — Inpatient Hospital Stay: Attending: Oncology

## 2024-06-06 ENCOUNTER — Ambulatory Visit: Admitting: Internal Medicine

## 2024-06-06 VITALS — BP 100/56 | HR 60 | Temp 97.8°F | Resp 16

## 2024-06-06 DIAGNOSIS — D509 Iron deficiency anemia, unspecified: Secondary | ICD-10-CM | POA: Insufficient documentation

## 2024-06-06 DIAGNOSIS — G2581 Restless legs syndrome: Secondary | ICD-10-CM

## 2024-06-06 MED ORDER — IRON SUCROSE 20 MG/ML IV SOLN
200.0000 mg | Freq: Once | INTRAVENOUS | Status: AC
  Administered 2024-06-06: 200 mg via INTRAVENOUS
  Filled 2024-06-06: qty 10

## 2024-06-06 NOTE — Patient Instructions (Signed)

## 2024-06-07 ENCOUNTER — Ambulatory Visit: Admitting: Podiatry

## 2024-06-07 DIAGNOSIS — M79675 Pain in left toe(s): Secondary | ICD-10-CM

## 2024-06-07 DIAGNOSIS — M79674 Pain in right toe(s): Secondary | ICD-10-CM

## 2024-06-07 DIAGNOSIS — B351 Tinea unguium: Secondary | ICD-10-CM | POA: Diagnosis not present

## 2024-06-07 NOTE — Progress Notes (Signed)
" ° °  No chief complaint on file.   SUBJECTIVE Patient presents to office today complaining of elongated, thickened nails that cause pain while ambulating in shoes.  Patient is unable to trim their own nails. Patient is here for further evaluation and treatment.  Past Medical History:  Diagnosis Date   Atypical lobular hyperplasia [facility]) of breast 2017   papilloma   GERD (gastroesophageal reflux disease)    Heart murmur    Hemifacial spasm    Left    History of pulmonary embolus (PE)    Hyperlipidemia    Hypertension    Restless leg syndrome     Allergies[1]   OBJECTIVE General Patient is awake, alert, and oriented x 3 and in no acute distress. Derm Skin is dry and supple bilateral. Negative open lesions or macerations. Remaining integument unremarkable. Nails are tender, long, thickened and dystrophic with subungual debris, consistent with onychomycosis, 1-5 bilateral. No signs of infection noted. Vasc  DP and PT pedal pulses palpable bilaterally. Temperature gradient within normal limits.  Neuro Epicritic and protective threshold sensation grossly intact bilaterally.  Musculoskeletal Exam No symptomatic pedal deformities noted bilateral. Muscular strength within normal limits.  ASSESSMENT 1.  Pain due to onychomycosis of toenails both  PLAN OF CARE 1. Patient evaluated today.  2. Instructed to maintain good pedal hygiene and foot care.  3. Mechanical debridement of nails 1-5 bilaterally performed using a nail nipper. Filed with dremel without incident.  4. Return to clinic in 3 mos.    Thresa EMERSON Sar, DPM [facility]  Dr. Thresa EMERSON Sar, DPM    [address] [place], [place] [address]                Office [phone]  Fax [phone]        [1] No Known Allergies  "

## 2024-06-09 ENCOUNTER — Telehealth: Payer: Self-pay | Admitting: Neurology

## 2024-06-09 ENCOUNTER — Encounter: Attending: Physician Assistant | Admitting: Physician Assistant

## 2024-06-09 DIAGNOSIS — L97822 Non-pressure chronic ulcer of other part of left lower leg with fat layer exposed: Secondary | ICD-10-CM | POA: Diagnosis not present

## 2024-06-09 DIAGNOSIS — Z7901 Long term (current) use of anticoagulants: Secondary | ICD-10-CM | POA: Insufficient documentation

## 2024-06-09 DIAGNOSIS — L97812 Non-pressure chronic ulcer of other part of right lower leg with fat layer exposed: Secondary | ICD-10-CM | POA: Insufficient documentation

## 2024-06-09 DIAGNOSIS — I10 Essential (primary) hypertension: Secondary | ICD-10-CM | POA: Insufficient documentation

## 2024-06-09 DIAGNOSIS — I2510 Atherosclerotic heart disease of native coronary artery without angina pectoris: Secondary | ICD-10-CM | POA: Insufficient documentation

## 2024-06-09 DIAGNOSIS — I87333 Chronic venous hypertension (idiopathic) with ulcer and inflammation of bilateral lower extremity: Secondary | ICD-10-CM | POA: Diagnosis present

## 2024-06-09 NOTE — Telephone Encounter (Signed)
 Submitted auth request via CMM to transition to SP, status is pending. Key: [employer]

## 2024-06-10 ENCOUNTER — Institutional Professional Consult (permissible substitution): Admitting: Cardiovascular Disease

## 2024-06-13 ENCOUNTER — Inpatient Hospital Stay

## 2024-06-13 VITALS — BP 121/65 | HR 60 | Temp 96.0°F | Resp 17

## 2024-06-13 DIAGNOSIS — D509 Iron deficiency anemia, unspecified: Secondary | ICD-10-CM | POA: Diagnosis not present

## 2024-06-13 DIAGNOSIS — G2581 Restless legs syndrome: Secondary | ICD-10-CM

## 2024-06-13 MED ORDER — SODIUM CHLORIDE 0.9% FLUSH
10.0000 mL | Freq: Once | INTRAVENOUS | Status: AC | PRN
  Administered 2024-06-13: 10 mL
  Filled 2024-06-13: qty 10

## 2024-06-13 MED ORDER — ABOBOTULINUMTOXINA 300 UNITS IM SOLR: 300.0000 [IU] | INTRAMUSCULAR | 3 refills | Status: AC

## 2024-06-13 MED ORDER — IRON SUCROSE 20 MG/ML IV SOLN
200.0000 mg | Freq: Once | INTRAVENOUS | Status: AC
  Administered 2024-06-13: 200 mg via INTRAVENOUS
  Filled 2024-06-13: qty 10

## 2024-06-13 NOTE — Telephone Encounter (Signed)
 Auth was approved, please send rx for Dysport  300u to [facility].  Auth#: [id number] (06/09/24-06/01/25)

## 2024-06-13 NOTE — Patient Instructions (Signed)

## 2024-06-13 NOTE — Progress Notes (Signed)
 Patient tolerated Venofer  well, no questions/concerns voiced. Monitored 30 min post transfusion. Patient stable at discharge. VSS. AVS given.

## 2024-06-13 NOTE — Addendum Note (Signed)
 Addended by: DOUGLASS DELON CROME on: 06/13/2024 09:40 AM   Modules accepted: Orders

## 2024-06-13 NOTE — Telephone Encounter (Signed)
 Rx sent to [facility]

## 2024-06-15 ENCOUNTER — Ambulatory Visit: Admitting: Registered Nurse

## 2024-06-15 ENCOUNTER — Encounter: Admission: RE | Payer: Self-pay | Source: Home / Self Care

## 2024-06-15 ENCOUNTER — Encounter: Payer: Self-pay | Admitting: Gastroenterology

## 2024-06-15 ENCOUNTER — Other Ambulatory Visit: Payer: Self-pay

## 2024-06-15 ENCOUNTER — Encounter: Admission: RE | Disposition: A | Payer: Self-pay | Source: Home / Self Care | Attending: Gastroenterology

## 2024-06-15 ENCOUNTER — Ambulatory Visit
Admission: RE | Admit: 2024-06-15 | Discharge: 2024-06-15 | Disposition: A | Discharge status: Home / Self Care | Attending: Gastroenterology | Admitting: Gastroenterology

## 2024-06-15 DIAGNOSIS — Z8673 Personal history of transient ischemic attack (TIA), and cerebral infarction without residual deficits: Secondary | ICD-10-CM | POA: Insufficient documentation

## 2024-06-15 DIAGNOSIS — I10 Essential (primary) hypertension: Secondary | ICD-10-CM | POA: Diagnosis not present

## 2024-06-15 DIAGNOSIS — Z79899 Other long term (current) drug therapy: Secondary | ICD-10-CM | POA: Diagnosis not present

## 2024-06-15 DIAGNOSIS — D509 Iron deficiency anemia, unspecified: Secondary | ICD-10-CM | POA: Diagnosis present

## 2024-06-15 DIAGNOSIS — D122 Benign neoplasm of ascending colon: Secondary | ICD-10-CM | POA: Insufficient documentation

## 2024-06-15 DIAGNOSIS — D125 Benign neoplasm of sigmoid colon: Secondary | ICD-10-CM | POA: Insufficient documentation

## 2024-06-15 DIAGNOSIS — K219 Gastro-esophageal reflux disease without esophagitis: Secondary | ICD-10-CM | POA: Insufficient documentation

## 2024-06-15 DIAGNOSIS — K449 Diaphragmatic hernia without obstruction or gangrene: Secondary | ICD-10-CM | POA: Insufficient documentation

## 2024-06-15 HISTORY — PX: SKIN BIOPSY: SHX1

## 2024-06-15 HISTORY — PX: COLONOSCOPY: SHX5424

## 2024-06-15 HISTORY — PX: POLYPECTOMY: SHX149

## 2024-06-15 HISTORY — PX: ESOPHAGOGASTRODUODENOSCOPY: SHX5428

## 2024-06-15 SURGERY — COLONOSCOPY
Anesthesia: General

## 2024-06-15 MED ORDER — SODIUM CHLORIDE 0.9 % IV SOLN: INTRAVENOUS | Status: DC

## 2024-06-15 MED ORDER — PROPOFOL 500 MG/50ML IV EMUL
INTRAVENOUS | Status: DC | PRN
  Administered 2024-06-15: 10 mg via INTRAVENOUS
  Administered 2024-06-15: 175 ug/kg/min via INTRAVENOUS
  Administered 2024-06-15: 40 mg via INTRAVENOUS

## 2024-06-15 NOTE — Op Note (Signed)
 [facility] Gastroenterology Patient Name: Patricia Knox Procedure Date: 06/15/2024 10:57 AM MRN: [id number] Account #: [id number] Date of Birth: 07-28-1935 Admit Type: Outpatient Age: 89 Room: [facility] ENDO ROOM 3 Gender: Female Note Status: Finalized Instrument Name: Peds Colonoscope [id number] Procedure:             Colonoscopy Indications:           Iron  deficiency anemia Providers:             Ruel Kung MD, MD Medicines:             Monitored Anesthesia Care Complications:         No immediate complications. Procedure:             Pre-Anesthesia Assessment:                        - Prior to the procedure, a History and Physical was                         performed, and patient medications, allergies and                         sensitivities were reviewed. The patient's tolerance                         of previous anesthesia was reviewed.                        - The risks and benefits of the procedure and the                         sedation options and risks were discussed with the                         patient. All questions were answered and informed                         consent was obtained.                        - ASA Grade Assessment: II - A patient with mild                         systemic disease.                        After obtaining informed consent, the colonoscope was                         passed under direct vision. Throughout the procedure,                         the patient's blood pressure, pulse, and oxygen                         saturations were monitored continuously. The                         Colonoscope was introduced through the anus and  advanced to the the cecum, identified by the                         appendiceal orifice. The colonoscopy was performed                         without difficulty. The patient tolerated the                         procedure well. The quality of the bowel preparation                          was excellent. The ileocecal valve, appendiceal                         orifice, and rectum were photographed. Findings:      The perianal and digital rectal examinations were normal.      Four sessile polyps were found in the ascending colon. The polyps were 5       to 6 mm in size. These polyps were removed with a cold snare. Resection       and retrieval were complete.      A 5 mm polyp was found in the sigmoid colon. The polyp was sessile. The       polyp was removed with a cold snare. Resection and retrieval were       complete.      A 10 mm polyp was found in the sigmoid colon. The polyp was       semi-pedunculated. The polyp was removed with a hot snare. Resection and       retrieval were complete.      The exam was otherwise without abnormality on direct and retroflexion       views. Impression:            - Four 5 to 6 mm polyps in the ascending colon,                         removed with a cold snare. Resected and retrieved.                        - One 5 mm polyp in the sigmoid colon, removed with a                         cold snare. Resected and retrieved.                        - One 10 mm polyp in the sigmoid colon, removed with a                         hot snare. Resected and retrieved.                        - The examination was otherwise normal on direct and                         retroflexion views. Recommendation:        - Discharge patient to home (with escort).                        -  Resume previous diet.                        - Continue present medications.                        - Await pathology results.                        - Repeat colonoscopy is not recommended due to current                         age (13 years or older) for surveillance.                        - Return to GI office as previously scheduled. Procedure Code(s):     --- Professional ---                        [id number], Colonoscopy, flexible; with removal of                          tumor(s), polyp(s), or other lesion(s) by snare                         technique Diagnosis Code(s):     --- Professional ---                        D12.2, Benign neoplasm of ascending colon                        D12.5, Benign neoplasm of sigmoid colon                        D50.9, Iron  deficiency anemia, unspecified CPT copyright 2022 American Medical Association. All rights reserved. The codes documented in this report are preliminary and upon coder review may  be revised to meet current compliance requirements. Ruel Kung, MD Ruel Kung MD, MD 06/15/2024 11:31:44 AM This report has been signed electronically. Number of Addenda: 0 Note Initiated On: 06/15/2024 10:57 AM Scope Withdrawal Time: 0 hours 10 minutes 24 seconds  Total Procedure Duration: 0 hours 12 minutes 22 seconds  Estimated Blood Loss:  Estimated blood loss: none.      [facility]

## 2024-06-15 NOTE — Anesthesia Preprocedure Evaluation (Signed)
"                                    Anesthesia Evaluation  Patient identified by MRN, date of birth, ID band Patient awake    Reviewed: Allergy & Precautions, NPO status , Patient's Chart, lab work & pertinent test results  Airway Mallampati: III  TM Distance: >3 FB Neck ROM: full    Dental  (+) Upper Dentures, Lower Dentures   Pulmonary neg pulmonary ROS   Pulmonary exam normal        Cardiovascular Exercise Tolerance: Good hypertension, Pt. on medications negative cardio ROS Normal cardiovascular exam Rhythm:Regular Rate:Normal     Neuro/Psych CVA negative neurological ROS  negative psych ROS   GI/Hepatic negative GI ROS, Neg liver ROS,GERD  Medicated,,  Endo/Other  negative endocrine ROS    Renal/GU negative Renal ROS  negative genitourinary   Musculoskeletal   Abdominal Normal abdominal exam  (+)   Peds negative pediatric ROS (+)  Hematology negative hematology ROS (+)   Anesthesia Other Findings Past Medical History: 2017: Atypical lobular hyperplasia ([facility]) of breast     Comment:  papilloma No date: GERD (gastroesophageal reflux disease) No date: Heart murmur No date: Hemifacial spasm     Comment:  Left  No date: History of pulmonary embolus (PE) No date: Hyperlipidemia No date: Hypertension No date: Restless leg syndrome  Past Surgical History: No date: ABDOMINAL HYSTERECTOMY 07/08/2013: BREAST BIOPSY; Left     Comment:  NEG - US  biopsy 01/30/2016: BREAST BIOPSY; Left     Comment:  Stereo - Intraductal papilloma and focal atypical               lobular hyperplasia 02/10/2018: BREAST BIOPSY; Left     Comment:  Affim Biopsy-(calcs/X clip) neg No date: BREAST EXCISIONAL BIOPSY 03/14/2016: BREAST LUMPECTOMY; Left     Comment:  Procedure: BREAST LUMPECTOMY;  Surgeon: Reyes LELON Cota, MD;  Location: [facility] ORS;  Service: General;                Laterality: Left; 06/2013: BREAST SURGERY; Left     Comment:  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 2002: COLONOSCOPY 1999: TOE SURGERY  BMI    Body Mass Index: 24.47 kg/m      Reproductive/Obstetrics negative OB ROS                              Anesthesia Physical Anesthesia Plan  ASA: 3  Anesthesia Plan: General   Post-op Pain Management:    Induction: Intravenous  PONV Risk Score and Plan: Propofol  infusion and TIVA  Airway Management Planned: Natural Airway and Nasal Cannula  Additional Equipment:   Intra-op Plan:   Post-operative Plan:   Informed Consent: I have reviewed the patients History and Physical, chart, labs and discussed the procedure including the risks, benefits and alternatives for the proposed anesthesia with the patient or authorized representative who has indicated his/her understanding and acceptance.     Dental Advisory Given  Plan Discussed with: CRNA  Anesthesia Plan Comments:         Anesthesia Quick Evaluation  "

## 2024-06-15 NOTE — H&P (Signed)
 "  Ruel Kung , MD [address], [place], [place], [address] Phone: [phone] Fax: [phone]  Primary Care Physician:  Fernand Fredy RAMAN, MD   Pre-Procedure History & Physical: HPI:  Patricia Knox is a 89 y.o. female is here for an endoscopy and colonoscopy    Past Medical History:  Diagnosis Date   Atypical lobular hyperplasia [facility]) of breast 2017   papilloma   GERD (gastroesophageal reflux disease)    Heart murmur    Hemifacial spasm    Left    History of pulmonary embolus (PE)    Hyperlipidemia    Hypertension    Restless leg syndrome     Past Surgical History:  Procedure Laterality Date   ABDOMINAL HYSTERECTOMY     BREAST BIOPSY Left 07/08/2013   NEG - US  biopsy   BREAST BIOPSY Left 01/30/2016   Stereo - Intraductal papilloma and focal atypical lobular hyperplasia   BREAST BIOPSY Left 02/10/2018   Affim Biopsy-(calcs/X clip) neg   BREAST EXCISIONAL BIOPSY     BREAST LUMPECTOMY Left 03/14/2016   Procedure: BREAST LUMPECTOMY;  Surgeon: Reyes LELON Cota, MD;  Location: [facility] ORS;  Service: General;  Laterality: Left;   BREAST SURGERY Left 06/2013   Pathology as noted above 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   COLONOSCOPY  2002   TOE SURGERY  1999    Prior to Admission medications  Medication Sig Start Date End Date Taking? Authorizing Provider  alendronate (FOSAMAX) 70 MG tablet Take 1 tablet by mouth once a week 08/14/23  Yes Fernand Fredy RAMAN, MD  amiodarone  (PACERONE ) 200 MG tablet Take 4 tablets/day for 1 week, then 3 tablets/day for 1 week, then 2 tablets/day for 1 week. Then follow up. 05/06/24  Yes Fernand Denyse LABOR, MD  amLODipine  (NORVASC ) 5 MG tablet Take 1 tablet by mouth once daily 05/11/24  Yes Fernand Fredy RAMAN, MD  atorvastatin  (LIPITOR) 40 MG tablet TAKE 1 TABLET BY MOUTH ONCE DAILY AT BEDTIME 06/29/23  Yes Fernand Fredy RAMAN, MD  furosemide  (LASIX ) 20 MG tablet Take 1 tablet (20 mg total) by mouth daily. 08/06/23 08/05/24 Yes  Fernand Fredy RAMAN, MD  gabapentin  (NEURONTIN ) 100 MG capsule TAKE 3 CAPSULES BY MOUTH AT BEDTIME 04/18/24  Yes Onita Duos, MD  abobotulinumtoxinA  (DYSPORT ) 300 units SOLR injection Inject 300 Units into the muscle every 3 (three) months. 06/13/24   Onita Duos, MD  acetaminophen  (TYLENOL ) 500 MG tablet Take 2 tablets (1,000 mg total) by mouth 3 (three) times daily as needed for mild pain (pain score 1-3), moderate pain (pain score 4-6) or headache. 04/22/24   Awanda City, MD  apixaban  (ELIQUIS ) 5 MG TABS tablet Take 1 tablet (5 mg total) by mouth 2 (two) times daily. Home med. 04/22/24   Awanda City, MD  Calcium  Carbonate-Vitamin D (CALTRATE 600+D PO) Take 1 capsule by mouth 2 (two) times daily.    [provider]  cyanocobalamin  (VITAMIN B12) 1000 MCG tablet Take 1 tablet (1,000 mcg total) by mouth daily. 05/11/24   Yu, Zhou, MD  hydrocortisone 2.5 % cream Apply 1 Application topically 2 (two) times a week. 03/29/24   [provider]  ketoconazole (NIZORAL) 2 % cream Apply 1 Application topically 2 (two) times daily. 03/29/24   [provider]  omeprazole (PRILOSEC) 20 MG capsule Take 1 capsule by mouth twice daily 10/19/23   Fernand Fredy RAMAN, MD  oxyCODONE  (OXY IR/ROXICODONE ) 5 MG immediate release tablet Take 1 tablet (5 mg  total) by mouth every 6 (six) hours as needed for severe pain (pain score 7-10). 04/22/24   Awanda City, MD  potassium chloride  SA (KLOR-CON  M) 20 MEQ tablet Take 1 tablet (20 mEq total) by mouth 2 (two) times daily for 3 days. 05/09/24 05/12/24  Fernand Fredy RAMAN, MD  rOPINIRole  (REQUIP ) 1 MG tablet Take 1 tablet by mouth twice daily 05/02/24   Fernand Fredy RAMAN, MD  spironolactone  (ALDACTONE ) 25 MG tablet Take 1 tablet by mouth once daily 02/15/23 05/11/24  Fernand Fredy RAMAN, MD  traMADol  (ULTRAM ) 50 MG tablet Take 1 tablet (50 mg total) by mouth every 6 (six) hours as needed. 05/03/24   Marea Selinda RAMAN, MD  traZODone  (DESYREL ) 100 MG tablet TAKE 1 TABLET BY MOUTH AT BEDTIME AS  NEEDED FOR INSOMNIA 05/02/24   Fernand Fredy RAMAN, MD  triamcinolone  (KENALOG ) 0.025 % cream Apply 1 Application topically daily. 11/05/23   [provider]    Allergies as of 05/31/2024   (No Known Allergies)    Family History  Problem Relation Age of Onset   Stroke Mother    Heart attack Father    Breast cancer Neg Hx     Social History   Socioeconomic History   Marital status: Single    Spouse name: Not on file   Number of children: 3   Years of education: HS   Highest education level: Not on file  Occupational History   Occupation: Retired  Tobacco Use   Smoking status: Never   Smokeless tobacco: Never  Substance and Sexual Activity   Alcohol use: No   Drug use: No   Sexual activity: Not on file  Other Topics Concern   Not on file  Social History Narrative   Lives at home with husband.   Right-handed.   No daily caffeine use.   Social Drivers of Health   Tobacco Use: Low Risk (06/15/2024)   Patient History    Smoking Tobacco Use: Never    Smokeless Tobacco Use: Never    Passive Exposure: Not on file  Financial Resource Strain: Low Risk  (05/04/2024)   Received [facility]   Overall Financial Resource Strain (CARDIA)    Difficulty of Paying Living Expenses: Not hard at all  Food Insecurity: No Food Insecurity (05/11/2024)   Epic    Worried About Running Out of Food in the Last Year: Never true    Ran Out of Food in the Last Year: Never true  Transportation Needs: No Transportation Needs (05/11/2024)   Epic    Lack of Transportation (Medical): No    Lack of Transportation (Non-Medical): No  Physical Activity: Not on file  Stress: Not on file  Social Connections: Unknown (04/21/2024)   Social Connection and Isolation Panel    Frequency of Communication with Friends and Family: More than three times a week    Frequency of Social Gatherings with Friends and Family: More than three times a week    Attends Religious Services: Patient  declined    Database Administrator or Organizations: Patient declined    Attends Banker Meetings: Patient declined    Marital Status: Widowed  Intimate Partner Violence: Not At Risk (05/11/2024)   Epic    Fear of Current or Ex-Partner: No    Emotionally Abused: No    Physically Abused: No    Sexually Abused: No  Depression (PHQ2-9): Low Risk (05/11/2024)   Depression (PHQ2-9)    PHQ-2 Score: 0  Alcohol Screen: Not on file  Housing: Low Risk (05/11/2024)   Epic    Unable to Pay for Housing in the Last Year: No    Number of Times Moved in the Last Year: 0    Homeless in the Last Year: No  Utilities: Not At Risk (05/11/2024)   Epic    Threatened with loss of utilities: No  Health Literacy: Not on file    Review of Systems: See HPI, otherwise negative ROS  Physical Exam: BP (!) 144/92   Pulse (!) 51   Temp (!) 96.4 F (35.8 C) (Temporal)   Resp 18   Ht 5' 2 (1.575 m)   Wt 60.7 kg   SpO2 98%   BMI 24.47 kg/m  General:   Alert,  pleasant and cooperative in NAD Head:  Normocephalic and atraumatic. Neck:  Supple; no masses or thyromegaly. Lungs:  Clear throughout to auscultation, normal respiratory effort.    Heart:  +S1, +S2, Regular rate and rhythm, No edema. Abdomen:  Soft, nontender and nondistended. Normal bowel sounds, without guarding, and without rebound.   Neurologic:  Alert and  oriented x4;  grossly normal neurologically.  Impression/Plan: Patricia Knox is here for an endoscopy and colonoscopy  to be performed for  evaluation of iron  deficiency anemia     Risks, benefits, limitations, and alternatives regarding endoscopy have been reviewed with the patient.  Questions have been answered.  All parties agreeable.   Ruel Kung, MD  06/15/2024, 10:59 AM  "

## 2024-06-15 NOTE — Op Note (Signed)
 [facility] Gastroenterology Patient Name: Patricia Knox Procedure Date: 06/15/2024 10:59 AM MRN: [id number] Account #: [id number] Date of Birth: Jan 05, 1936 Admit Type: Outpatient Age: 89 Room: [facility] ENDO ROOM 3 Gender: Female Note Status: Finalized Instrument Name: [facility]ie GI Scope [id number] Procedure:             Upper GI endoscopy Indications:           Iron  deficiency anemia Providers:             Ruel Kung MD, MD Medicines:             Monitored Anesthesia Care Complications:         No immediate complications. Procedure:             Pre-Anesthesia Assessment:                        - Prior to the procedure, a History and Physical was                         performed, and patient medications, allergies and                         sensitivities were reviewed. The patient's tolerance                         of previous anesthesia was reviewed.                        - The risks and benefits of the procedure and the                         sedation options and risks were discussed with the                         patient. All questions were answered and informed                         consent was obtained.                        - ASA Grade Assessment: II - A patient with mild                         systemic disease.                        After obtaining informed consent, the endoscope was                         passed under direct vision. Throughout the procedure,                         the patient's blood pressure, pulse, and oxygen                         saturations were monitored continuously. The Endoscope                         was introduced through the mouth, and advanced to the  third part of duodenum. The upper GI endoscopy was                         accomplished with ease. The patient tolerated the                         procedure well. Findings:      The esophagus was normal.      A large hiatal hernia was present.       The examined duodenum was normal.      The examined duodenum was normal. Biopsies were taken with a cold       forceps for histology. Impression:            - Normal esophagus.                        - Large hiatal hernia.                        - Normal examined duodenum.                        - Normal examined duodenum. Biopsied. Recommendation:        - Await pathology results.                        - Perform a colonoscopy today. Procedure Code(s):     --- Professional ---                        [phone], Esophagogastroduodenoscopy, flexible,                         transoral; with biopsy, single or multiple Diagnosis Code(s):     --- Professional ---                        K44.9, Diaphragmatic hernia without obstruction or                         gangrene                        D50.9, Iron  deficiency anemia, unspecified CPT copyright 2022 American Medical Association. All rights reserved. The codes documented in this report are preliminary and upon coder review may  be revised to meet current compliance requirements. Ruel Kung, MD Ruel Kung MD, MD 06/15/2024 11:14:20 AM This report has been signed electronically. Number of Addenda: 0 Note Initiated On: 06/15/2024 10:59 AM Estimated Blood Loss:  Estimated blood loss: none.      [facility]

## 2024-06-15 NOTE — Anesthesia Postprocedure Evaluation (Signed)
"   Anesthesia Post Note  Patient: NARIAH MORGANO  Procedure(s) Performed: COLONOSCOPY EGD (ESOPHAGOGASTRODUODENOSCOPY) BIOPSY, SMALL INTESTINE TISSUE POLYPECTOMY, INTESTINE  Patient location during evaluation: PACU Anesthesia Type: General Level of consciousness: awake Pain management: satisfactory to patient Vital Signs Assessment: post-procedure vital signs reviewed and stable Respiratory status: spontaneous breathing Cardiovascular status: stable Anesthetic complications: no   There were no known notable events for this encounter.   Last Vitals:  Vitals:   06/15/24 0947  BP: (!) 144/92  Pulse: (!) 51  Resp: 18  Temp: (!) 35.8 C  SpO2: 98%    Last Pain:  Vitals:   06/15/24 0947  TempSrc: Temporal  PainSc: 0-No pain                 VAN STAVEREN,Marleah Beever      "

## 2024-06-15 NOTE — Transfer of Care (Signed)
 Immediate Anesthesia Transfer of Care Note  Patient: Patricia Knox  Procedure(s) Performed: COLONOSCOPY EGD (ESOPHAGOGASTRODUODENOSCOPY) BIOPSY, SMALL INTESTINE TISSUE POLYPECTOMY, INTESTINE  Patient Location: PACU  Anesthesia Type:General  Level of Consciousness: drowsy  Airway & Oxygen Therapy: Patient Spontanous Breathing  Post-op Assessment: Report given to RN and Post -op Vital signs reviewed and stable  Post vital signs: stable  Last Vitals:  Vitals Value Taken Time  BP 103/57 06/15/24 11:36  Temp    Pulse 50 06/15/24 11:37  Resp 21 06/15/24 11:37  SpO2 100 % 06/15/24 11:37  Vitals shown include unfiled device data.  Last Pain:  Vitals:   06/15/24 0947  TempSrc: Temporal  PainSc: 0-No pain         Complications: There were no known notable events for this encounter.

## 2024-06-16 ENCOUNTER — Ambulatory Visit

## 2024-06-16 ENCOUNTER — Encounter: Payer: Self-pay | Admitting: Gastroenterology

## 2024-06-16 DIAGNOSIS — R0602 Shortness of breath: Secondary | ICD-10-CM | POA: Diagnosis not present

## 2024-06-16 DIAGNOSIS — I4891 Unspecified atrial fibrillation: Secondary | ICD-10-CM | POA: Diagnosis not present

## 2024-06-16 DIAGNOSIS — I2699 Other pulmonary embolism without acute cor pulmonale: Secondary | ICD-10-CM

## 2024-06-16 DIAGNOSIS — I82412 Acute embolism and thrombosis of left femoral vein: Secondary | ICD-10-CM

## 2024-06-16 DIAGNOSIS — D649 Anemia, unspecified: Secondary | ICD-10-CM

## 2024-06-16 DIAGNOSIS — E782 Mixed hyperlipidemia: Secondary | ICD-10-CM

## 2024-06-16 DIAGNOSIS — I493 Ventricular premature depolarization: Secondary | ICD-10-CM

## 2024-06-16 DIAGNOSIS — I2694 Multiple subsegmental pulmonary emboli without acute cor pulmonale: Secondary | ICD-10-CM

## 2024-06-16 DIAGNOSIS — I10 Essential (primary) hypertension: Secondary | ICD-10-CM

## 2024-06-16 DIAGNOSIS — I340 Nonrheumatic mitral (valve) insufficiency: Secondary | ICD-10-CM | POA: Diagnosis not present

## 2024-06-16 DIAGNOSIS — I83029 Varicose veins of left lower extremity with ulcer of unspecified site: Secondary | ICD-10-CM

## 2024-06-16 LAB — SURGICAL PATHOLOGY

## 2024-06-16 MED ORDER — TECHNETIUM TC 99M SESTAMIBI GENERIC - CARDIOLITE
30.1000 | Freq: Once | INTRAVENOUS | Status: AC | PRN
  Administered 2024-06-16: 30.1 via INTRAVENOUS

## 2024-06-16 MED ORDER — TECHNETIUM TC 99M SESTAMIBI GENERIC - CARDIOLITE
10.4000 | Freq: Once | INTRAVENOUS | Status: AC | PRN
  Administered 2024-06-16: 10.4 via INTRAVENOUS

## 2024-06-17 ENCOUNTER — Other Ambulatory Visit (HOSPITAL_BASED_OUTPATIENT_CLINIC_OR_DEPARTMENT_OTHER): Payer: Self-pay

## 2024-06-17 ENCOUNTER — Encounter: Admitting: Physician Assistant

## 2024-06-17 ENCOUNTER — Ambulatory Visit

## 2024-06-17 DIAGNOSIS — I2699 Other pulmonary embolism without acute cor pulmonale: Secondary | ICD-10-CM

## 2024-06-17 DIAGNOSIS — I340 Nonrheumatic mitral (valve) insufficiency: Secondary | ICD-10-CM | POA: Diagnosis not present

## 2024-06-17 DIAGNOSIS — I83019 Varicose veins of right lower extremity with ulcer of unspecified site: Secondary | ICD-10-CM

## 2024-06-17 DIAGNOSIS — I82412 Acute embolism and thrombosis of left femoral vein: Secondary | ICD-10-CM

## 2024-06-17 DIAGNOSIS — I361 Nonrheumatic tricuspid (valve) insufficiency: Secondary | ICD-10-CM | POA: Diagnosis not present

## 2024-06-17 DIAGNOSIS — D649 Anemia, unspecified: Secondary | ICD-10-CM

## 2024-06-17 DIAGNOSIS — I4891 Unspecified atrial fibrillation: Secondary | ICD-10-CM

## 2024-06-17 DIAGNOSIS — I10 Essential (primary) hypertension: Secondary | ICD-10-CM

## 2024-06-17 DIAGNOSIS — I371 Nonrheumatic pulmonary valve insufficiency: Secondary | ICD-10-CM

## 2024-06-17 DIAGNOSIS — I2694 Multiple subsegmental pulmonary emboli without acute cor pulmonale: Secondary | ICD-10-CM

## 2024-06-17 DIAGNOSIS — I493 Ventricular premature depolarization: Secondary | ICD-10-CM

## 2024-06-17 DIAGNOSIS — I87333 Chronic venous hypertension (idiopathic) with ulcer and inflammation of bilateral lower extremity: Secondary | ICD-10-CM | POA: Diagnosis not present

## 2024-06-17 DIAGNOSIS — E782 Mixed hyperlipidemia: Secondary | ICD-10-CM

## 2024-06-17 DIAGNOSIS — R0602 Shortness of breath: Secondary | ICD-10-CM

## 2024-06-20 ENCOUNTER — Encounter: Payer: Self-pay | Admitting: Cardiovascular Disease

## 2024-06-20 ENCOUNTER — Ambulatory Visit: Admitting: Cardiovascular Disease

## 2024-06-20 ENCOUNTER — Inpatient Hospital Stay

## 2024-06-20 VITALS — BP 113/69 | HR 64 | Temp 97.7°F | Resp 16

## 2024-06-20 VITALS — BP 122/76 | HR 57 | Ht 62.0 in | Wt 133.0 lb

## 2024-06-20 DIAGNOSIS — D509 Iron deficiency anemia, unspecified: Secondary | ICD-10-CM | POA: Diagnosis not present

## 2024-06-20 DIAGNOSIS — I4891 Unspecified atrial fibrillation: Secondary | ICD-10-CM

## 2024-06-20 DIAGNOSIS — E782 Mixed hyperlipidemia: Secondary | ICD-10-CM

## 2024-06-20 DIAGNOSIS — I82412 Acute embolism and thrombosis of left femoral vein: Secondary | ICD-10-CM | POA: Diagnosis not present

## 2024-06-20 DIAGNOSIS — I2699 Other pulmonary embolism without acute cor pulmonale: Secondary | ICD-10-CM | POA: Diagnosis not present

## 2024-06-20 DIAGNOSIS — G2581 Restless legs syndrome: Secondary | ICD-10-CM

## 2024-06-20 DIAGNOSIS — Z0130 Encounter for examination of blood pressure without abnormal findings: Secondary | ICD-10-CM

## 2024-06-20 DIAGNOSIS — I340 Nonrheumatic mitral (valve) insufficiency: Secondary | ICD-10-CM | POA: Diagnosis not present

## 2024-06-20 DIAGNOSIS — R0602 Shortness of breath: Secondary | ICD-10-CM | POA: Diagnosis not present

## 2024-06-20 DIAGNOSIS — I493 Ventricular premature depolarization: Secondary | ICD-10-CM

## 2024-06-20 MED ORDER — IRON SUCROSE 20 MG/ML IV SOLN
200.0000 mg | Freq: Once | INTRAVENOUS | Status: AC
  Administered 2024-06-20: 200 mg via INTRAVENOUS
  Filled 2024-06-20: qty 10

## 2024-06-20 NOTE — Patient Instructions (Signed)

## 2024-06-20 NOTE — Progress Notes (Signed)
 "     Cardiology Office Note   Date:  06/20/2024   ID:  ZYKERA ABELLA, DOB Nov 27, 1935, MRN [id number]  PCP:  Fernand Fredy RAMAN, MD  Cardiologist:  Denyse Fernand, MD      History of Present Illness: Patricia Knox is a 89 y.o. female who presents for  Chief Complaint  Patient presents with   Follow-up    Nst/echo results    Feeling fine      Past Medical History:  Diagnosis Date   Atypical lobular hyperplasia [facility]) of breast 2017   papilloma   GERD (gastroesophageal reflux disease)    Heart murmur    Hemifacial spasm    Left    History of pulmonary embolus (PE)    Hyperlipidemia    Hypertension    Restless leg syndrome      Past Surgical History:  Procedure Laterality Date   ABDOMINAL HYSTERECTOMY     BREAST BIOPSY Left 07/08/2013   NEG - US  biopsy   BREAST BIOPSY Left 01/30/2016   Stereo - Intraductal papilloma and focal atypical lobular hyperplasia   BREAST BIOPSY Left 02/10/2018   Affim Biopsy-(calcs/X clip) neg   BREAST EXCISIONAL BIOPSY     BREAST LUMPECTOMY Left 03/14/2016   Procedure: BREAST LUMPECTOMY;  Surgeon: Reyes LELON Cota, MD;  Location: [facility] ORS;  Service: General;  Laterality: Left;   BREAST SURGERY Left 06/2013   Pathology as noted above 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   COLONOSCOPY  2002   COLONOSCOPY N/A 06/15/2024   Procedure: COLONOSCOPY;  Surgeon: Therisa Bi, MD;  Location: [facility] ENDOSCOPY;  Service: Gastroenterology;  Laterality: N/A;  Call dtr Clarita Daring with patient arrival time, [phone]   ESOPHAGOGASTRODUODENOSCOPY N/A 06/15/2024   Procedure: EGD (ESOPHAGOGASTRODUODENOSCOPY);  Surgeon: Therisa Bi, MD;  Location: [facility] ENDOSCOPY;  Service: Gastroenterology;  Laterality: N/A;   POLYPECTOMY  06/15/2024   Procedure: POLYPECTOMY, INTESTINE;  Surgeon: Therisa Bi, MD;  Location: [facility] ENDOSCOPY;  Service: Gastroenterology;;   SKIN BIOPSY  06/15/2024   Procedure: BIOPSY, SMALL INTESTINE TISSUE;  Surgeon: Therisa Bi, MD;  Location: [facility] ENDOSCOPY;  Service: Gastroenterology;;  DUODENUM   TOE SURGERY  1999     Current Outpatient Medications  Medication Sig Dispense Refill   abobotulinumtoxinA  (DYSPORT ) 300 units SOLR injection Inject 300 Units into the muscle every 3 (three) months. 1 each 3   acetaminophen  (TYLENOL ) 500 MG tablet Take 2 tablets (1,000 mg total) by mouth 3 (three) times daily as needed for mild pain (pain score 1-3), moderate pain (pain score 4-6) or headache.     alendronate (FOSAMAX) 70 MG tablet Take 1 tablet by mouth once a week 12 tablet 3   amiodarone  (PACERONE ) 200 MG tablet Take 4 tablets/day for 1 week, then 3 tablets/day for 1 week, then 2 tablets/day for 1 week. Then follow up. 70 tablet 0   amLODipine  (NORVASC ) 5 MG tablet Take 1 tablet by mouth once daily 90 tablet 3   apixaban  (ELIQUIS ) 5 MG TABS tablet Take 1 tablet (5 mg total) by mouth 2 (two) times daily. Home med.     atorvastatin  (LIPITOR) 40 MG tablet TAKE 1 TABLET BY MOUTH ONCE DAILY AT BEDTIME 90 tablet 3   Calcium  Carbonate-Vitamin D (CALTRATE 600+D PO) Take 1 capsule by mouth 2 (two) times daily.     cyanocobalamin  (VITAMIN B12) 1000 MCG tablet Take 1 tablet (1,000 mcg total) by mouth daily. 90 tablet 0   furosemide  (LASIX ) 20 MG tablet Take 1  tablet (20 mg total) by mouth daily. 30 tablet 11   gabapentin  (NEURONTIN ) 100 MG capsule TAKE 3 CAPSULES BY MOUTH AT BEDTIME 270 capsule 0   hydrocortisone 2.5 % cream Apply 1 Application topically 2 (two) times a week.     ketoconazole (NIZORAL) 2 % cream Apply 1 Application topically 2 (two) times daily.     omeprazole (PRILOSEC) 20 MG capsule Take 1 capsule by mouth twice daily 180 capsule 3   oxyCODONE  (OXY IR/ROXICODONE ) 5 MG immediate release tablet Take 1 tablet (5 mg total) by mouth every 6 (six) hours as needed for severe pain (pain score 7-10). 10 tablet 0   potassium chloride  SA (KLOR-CON  M) 20 MEQ tablet Take 1 tablet (20 mEq total) by mouth 2 (two) times  daily for 3 days. 6 tablet 0   rOPINIRole  (REQUIP ) 1 MG tablet Take 1 tablet by mouth twice daily 180 tablet 0   spironolactone  (ALDACTONE ) 25 MG tablet Take 1 tablet by mouth once daily 90 tablet 3   traMADol  (ULTRAM ) 50 MG tablet Take 1 tablet (50 mg total) by mouth every 6 (six) hours as needed. 20 tablet 0   traZODone  (DESYREL ) 100 MG tablet TAKE 1 TABLET BY MOUTH AT BEDTIME AS NEEDED FOR INSOMNIA 90 tablet 0   triamcinolone  (KENALOG ) 0.025 % cream Apply 1 Application topically daily.     No current facility-administered medications for this visit.    Allergies:   Patient has no known allergies.    Social History:   reports that she has never smoked. She has never used smokeless tobacco. She reports that she does not drink alcohol and does not use drugs.   Family History:  family history includes Heart attack in her father; Stroke in her mother.    ROS:     Review of Systems  Constitutional: Negative.   HENT: Negative.    Eyes: Negative.   Respiratory: Negative.    Gastrointestinal: Negative.   Genitourinary: Negative.   Musculoskeletal: Negative.   Skin: Negative.   Neurological: Negative.   Endo/Heme/Allergies: Negative.   Psychiatric/Behavioral: Negative.    All other systems reviewed and are negative.     All other systems are reviewed and negative.    PHYSICAL EXAM: VS:  BP 122/76   Pulse (!) 57   Ht 5' 2 (1.575 m)   Wt 133 lb (60.3 kg)   SpO2 95%   BMI 24.33 kg/m  , BMI Body mass index is 24.33 kg/m. Last weight:  Wt Readings from Last 3 Encounters:  06/20/24 133 lb (60.3 kg)  06/15/24 133 lb 12.8 oz (60.7 kg)  05/11/24 135 lb 3.2 oz (61.3 kg)     Physical Exam Constitutional:      Appearance: Normal appearance.  Cardiovascular:     Rate and Rhythm: Normal rate and regular rhythm.     Heart sounds: Normal heart sounds.  Pulmonary:     Effort: Pulmonary effort is normal.     Breath sounds: Normal breath sounds.  Musculoskeletal:     Right  lower leg: No edema.     Left lower leg: No edema.  Neurological:     Mental Status: She is alert.       EKG:   Recent Labs: 09/10/2023: ALT 15 04/21/2024: Pro Brain Natriuretic Peptide 851.0 05/06/2024: BUN 13; Creatinine, Ser 1.38; Magnesium 1.9; Potassium 3.4; Sodium 138 05/11/2024: Hemoglobin 9.0; Platelets 489    Lipid Panel    Component Value Date/Time   CHOL 154 09/10/2023 1107  TRIG 95 09/10/2023 1107   HDL 61 09/10/2023 1107   LDLCALC 76 09/10/2023 1107      Other studies Reviewed: Additional studies/ records that were reviewed today include:  Review of the above records demonstrates:       No data to display            ASSESSMENT AND PLAN:    ICD-10-CM   1. Mixed hyperlipidemia  E78.2     2. 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 Acute deep vein thrombosis (DVT) of femoral vein of left lower extremity (HCC)  I82.412     4. Acute pulmonary embolism without acute cor pulmonale, unspecified pulmonary embolism type (HCC)  I26.99     5. SOB (shortness of breath)  R06.02    Fixed apical defect with no ischaemia, and has no chest pains.    6. Nonrheumatic mitral valve regurgitation  I34.0     7. New onset atrial fibrillation (HCC)  I48.91    In NSR, echo LVEF 57%, grade 1 diastolic dysfunction.       Problem List Items Addressed This Visit       Cardiovascular and Mediastinum   Acute pulmonary embolism (HCC)   Acute deep vein thrombosis (DVT) of femoral vein of left lower extremity (HCC)     Other   Hyperlipidemia - Primary   Other Visit Diagnoses       Frequent PVCs         SOB (shortness of breath)       Fixed apical defect with no ischaemia, and has no chest pains.     Nonrheumatic mitral valve regurgitation         New onset atrial fibrillation (HCC)       In NSR, echo LVEF 57%, grade 1 diastolic dysfunction.          Disposition:   Return in about 3 months (around 09/18/2024).    Total time spent: 35 minutes  Signed,  Denyse Bathe, MD  06/20/2024 9:31 AM    [facility] "

## 2024-06-21 ENCOUNTER — Ambulatory Visit (INDEPENDENT_AMBULATORY_CARE_PROVIDER_SITE_OTHER): Admitting: Nurse Practitioner

## 2024-06-22 ENCOUNTER — Other Ambulatory Visit: Payer: Self-pay | Admitting: Internal Medicine

## 2024-06-24 ENCOUNTER — Encounter: Admitting: Physician Assistant

## 2024-06-24 DIAGNOSIS — I87333 Chronic venous hypertension (idiopathic) with ulcer and inflammation of bilateral lower extremity: Secondary | ICD-10-CM | POA: Diagnosis not present

## 2024-06-27 ENCOUNTER — Inpatient Hospital Stay

## 2024-06-30 ENCOUNTER — Telehealth (INDEPENDENT_AMBULATORY_CARE_PROVIDER_SITE_OTHER): Payer: Self-pay | Admitting: Vascular Surgery

## 2024-06-30 NOTE — Telephone Encounter (Signed)
 Called pt 2x and LVM for scheduling with [facility]   Right Leg SSV Laser Ablation see JD. No auth req. 1 unit total   LO. 1 week post right SSV laser.   4 week post right SSV laser. See jd/fb

## 2024-07-01 ENCOUNTER — Ambulatory Visit: Payer: Self-pay | Admitting: Gastroenterology

## 2024-07-01 ENCOUNTER — Encounter: Admitting: Physician Assistant

## 2024-07-01 DIAGNOSIS — I87333 Chronic venous hypertension (idiopathic) with ulcer and inflammation of bilateral lower extremity: Secondary | ICD-10-CM | POA: Diagnosis not present

## 2024-07-04 ENCOUNTER — Ambulatory Visit (INDEPENDENT_AMBULATORY_CARE_PROVIDER_SITE_OTHER): Admitting: Nurse Practitioner

## 2024-07-06 ENCOUNTER — Encounter: Payer: Self-pay | Admitting: Neurology

## 2024-07-06 ENCOUNTER — Ambulatory Visit (INDEPENDENT_AMBULATORY_CARE_PROVIDER_SITE_OTHER): Admitting: Neurology

## 2024-07-06 ENCOUNTER — Ambulatory Visit: Admitting: Neurology

## 2024-07-06 VITALS — BP 109/74 | HR 77

## 2024-07-06 DIAGNOSIS — G5132 Clonic hemifacial spasm, left: Secondary | ICD-10-CM | POA: Diagnosis not present

## 2024-07-06 MED ORDER — ABOBOTULINUMTOXINA 300 UNITS IM SOLR
300.0000 [IU] | Freq: Once | INTRAMUSCULAR | Status: AC
  Administered 2024-07-06: 300 [IU] via INTRAMUSCULAR

## 2024-07-06 NOTE — Progress Notes (Signed)
 Dysport  300units x 1 vial  [phone] [id number] Exp-10/30/2025 B/B Bacteriostatic 0.9% Sodium Chloride - 1.5 mL  Lot: [id number] Expiration: 04/01/25 NDC: [id number] Dx: H48.67  WITNESSED AB:[id number]

## 2024-07-06 NOTE — Progress Notes (Signed)
" ° ° °  PATIENT: Patricia Knox  DOB: April 08, 1936  Chief Complaint  Patient presents with   Hemifacial Spasms    Dysport  300 units x 1 vial - office supply     HISTORICAL  Patricia Knox is a 89 year old right-handed female, seen in refer by her primary care doctor  Fredy GORMAN Bathe for continued EMG guided injection for her left hemifacial spasm.  She had a gradual onset left hemifacial spasm for many years,  She began to receive EMG guided incobotulism toxin a by Dr. Ozell Loyal at [facility] works very well for her, last injection was in July 2017,used total 27.5 units, but because of the insurance reason she has to switch to different practice,she hopes to continue injection through our office.  She had a history of restless leg syndrome, complains of frequent bilateral calf muscle cramping, increased recently, woke her up few times each night, despite taking Requip  0.5 milligrams twice a day,   She came in for EMG guided xeomin  injection for left hemifacial spasm today, first injection was on May 28, 2016   Over the years, she has been coming in every 3 to 4 months for repeat injection for left hemifacial spasm, using Dysport , tolerating it well, did develop mild facial symmetry   PHYSICAL EXAM   Vitals:   04/15/17 1350  BP: 124/77  Pulse: 68  Weight: 155 lb (70.3 kg)  Height: 5' 2 (1.575 m)   Body mass index is 28.35 kg/m.    She has occasionally left hemifacial spasm, involving left orbicularis oculi, occasional nvolvement of left cheek, left frontalis, and the orbicularis auris, platysmas muscles. Mild asymmetry of left face, droopiness of left face     ASSESSMENT AND PLAN  Patricia Knox is a 89 y.o. female    Left hemifacial spasm Dysport  300 units units into 1.5 cc of normal saline, 10 units/0.05cc) (please divide the content into 1cc syringe for facial injection)  0.05 cc at each injection site  19 x0.05 cc(= 10 units) =190 units      Modena Callander,  M.D. Ph.D.  [facility] [address] [place], [place] [address] Ph: [phone] Fax: [phone]  CC: Fredy GORMAN Bathe, MD  "

## 2024-07-08 ENCOUNTER — Encounter: Admitting: Physician Assistant

## 2024-08-11 ENCOUNTER — Inpatient Hospital Stay

## 2024-08-15 ENCOUNTER — Inpatient Hospital Stay

## 2024-08-15 ENCOUNTER — Inpatient Hospital Stay: Admitting: Oncology

## 2024-09-08 ENCOUNTER — Ambulatory Visit: Admitting: Podiatry

## 2024-09-19 ENCOUNTER — Ambulatory Visit: Admitting: Cardiovascular Disease

## 2024-09-28 ENCOUNTER — Ambulatory Visit: Admitting: Neurology
